# Patient Record
Sex: Female | Born: 1960 | Race: White | Hispanic: No | Marital: Married | Smoking: Never smoker
Health system: Southern US, Community
[De-identification: ages and names within clinical notes are randomized; demographics above are authoritative.]

## PROBLEM LIST (undated history)

## (undated) DIAGNOSIS — I10 Essential (primary) hypertension: Secondary | ICD-10-CM

## (undated) DIAGNOSIS — I341 Nonrheumatic mitral (valve) prolapse: Secondary | ICD-10-CM

## (undated) DIAGNOSIS — M1990 Unspecified osteoarthritis, unspecified site: Secondary | ICD-10-CM

## (undated) DIAGNOSIS — T883XXA Malignant hyperthermia due to anesthesia, initial encounter: Secondary | ICD-10-CM

## (undated) DIAGNOSIS — E785 Hyperlipidemia, unspecified: Secondary | ICD-10-CM

## (undated) DIAGNOSIS — M5124 Other intervertebral disc displacement, thoracic region: Secondary | ICD-10-CM

## (undated) DIAGNOSIS — M797 Fibromyalgia: Secondary | ICD-10-CM

## (undated) DIAGNOSIS — G35D Multiple sclerosis, unspecified: Secondary | ICD-10-CM

## (undated) DIAGNOSIS — J45909 Unspecified asthma, uncomplicated: Secondary | ICD-10-CM

## (undated) DIAGNOSIS — J189 Pneumonia, unspecified organism: Secondary | ICD-10-CM

## (undated) DIAGNOSIS — Z8669 Personal history of other diseases of the nervous system and sense organs: Secondary | ICD-10-CM

## (undated) DIAGNOSIS — K859 Acute pancreatitis without necrosis or infection, unspecified: Secondary | ICD-10-CM

## (undated) DIAGNOSIS — J449 Chronic obstructive pulmonary disease, unspecified: Secondary | ICD-10-CM

## (undated) DIAGNOSIS — M810 Age-related osteoporosis without current pathological fracture: Secondary | ICD-10-CM

## (undated) DIAGNOSIS — G35 Multiple sclerosis: Secondary | ICD-10-CM

## (undated) DIAGNOSIS — K6389 Other specified diseases of intestine: Secondary | ICD-10-CM

## (undated) HISTORY — DX: Essential (primary) hypertension: I10

## (undated) HISTORY — DX: Acute pancreatitis without necrosis or infection, unspecified: K85.90

## (undated) HISTORY — DX: Fibromyalgia: M79.7

## (undated) HISTORY — DX: Personal history of other diseases of the nervous system and sense organs: Z86.69

## (undated) HISTORY — DX: Other specified diseases of intestine: K63.89

## (undated) HISTORY — PX: ABDOMINAL HYSTERECTOMY: SHX81

## (undated) HISTORY — DX: Other intervertebral disc displacement, thoracic region: M51.24

## (undated) HISTORY — DX: Hyperlipidemia, unspecified: E78.5

## (undated) HISTORY — DX: Chronic obstructive pulmonary disease, unspecified: J44.9

## (undated) HISTORY — DX: Unspecified asthma, uncomplicated: J45.909

## (undated) HISTORY — PX: BREAST BIOPSY: SHX20

## (undated) HISTORY — PX: OTHER SURGICAL HISTORY: SHX169

## (undated) HISTORY — PX: TONSILLECTOMY: SUR1361

## (undated) HISTORY — PX: DEBRIDEMENT LEG: SUR390

---

## 2000-05-06 ENCOUNTER — Other Ambulatory Visit: Admission: RE | Admit: 2000-05-06 | Discharge: 2000-05-06 | Payer: Self-pay | Admitting: Obstetrics and Gynecology

## 2001-05-10 ENCOUNTER — Other Ambulatory Visit: Admission: RE | Admit: 2001-05-10 | Discharge: 2001-05-10 | Payer: Self-pay | Admitting: Obstetrics and Gynecology

## 2007-06-30 ENCOUNTER — Emergency Department (HOSPITAL_COMMUNITY): Admission: EM | Admit: 2007-06-30 | Discharge: 2007-06-30 | Payer: Self-pay | Admitting: Emergency Medicine

## 2007-06-30 ENCOUNTER — Ambulatory Visit (HOSPITAL_COMMUNITY): Admission: RE | Admit: 2007-06-30 | Discharge: 2007-06-30 | Payer: Self-pay | Admitting: Family Medicine

## 2009-01-07 ENCOUNTER — Ambulatory Visit (HOSPITAL_COMMUNITY): Admission: RE | Admit: 2009-01-07 | Discharge: 2009-01-07 | Payer: Self-pay | Admitting: Family Medicine

## 2009-01-10 ENCOUNTER — Encounter: Admission: RE | Admit: 2009-01-10 | Discharge: 2009-01-10 | Payer: Self-pay | Admitting: Family Medicine

## 2009-07-12 ENCOUNTER — Emergency Department (HOSPITAL_COMMUNITY): Admission: EM | Admit: 2009-07-12 | Discharge: 2009-07-12 | Payer: Self-pay | Admitting: Emergency Medicine

## 2009-08-15 ENCOUNTER — Encounter: Admission: RE | Admit: 2009-08-15 | Discharge: 2009-08-15 | Payer: Self-pay | Admitting: Orthopedic Surgery

## 2009-09-03 ENCOUNTER — Encounter: Admission: RE | Admit: 2009-09-03 | Discharge: 2009-09-03 | Payer: Self-pay | Admitting: Orthopedic Surgery

## 2010-03-20 ENCOUNTER — Ambulatory Visit (HOSPITAL_COMMUNITY): Admission: RE | Admit: 2010-03-20 | Discharge: 2010-03-20 | Payer: Self-pay | Admitting: Family Medicine

## 2010-08-03 ENCOUNTER — Encounter: Payer: Self-pay | Admitting: Orthopedic Surgery

## 2010-08-04 ENCOUNTER — Encounter: Payer: Self-pay | Admitting: Family Medicine

## 2010-11-26 ENCOUNTER — Emergency Department (HOSPITAL_COMMUNITY)
Admission: EM | Admit: 2010-11-26 | Discharge: 2010-11-26 | Disposition: A | Discharge status: Home / Self Care | Payer: Self-pay | Attending: Emergency Medicine | Admitting: Emergency Medicine

## 2010-11-26 ENCOUNTER — Emergency Department (HOSPITAL_COMMUNITY): Payer: Self-pay

## 2010-11-26 DIAGNOSIS — M79609 Pain in unspecified limb: Secondary | ICD-10-CM | POA: Insufficient documentation

## 2010-11-26 DIAGNOSIS — I10 Essential (primary) hypertension: Secondary | ICD-10-CM | POA: Insufficient documentation

## 2010-11-26 DIAGNOSIS — Y998 Other external cause status: Secondary | ICD-10-CM | POA: Insufficient documentation

## 2010-11-26 DIAGNOSIS — S0990XA Unspecified injury of head, initial encounter: Secondary | ICD-10-CM | POA: Insufficient documentation

## 2010-12-04 ENCOUNTER — Other Ambulatory Visit: Payer: Self-pay | Admitting: Neurosurgery

## 2011-10-12 ENCOUNTER — Emergency Department (HOSPITAL_COMMUNITY)
Admission: EM | Admit: 2011-10-12 | Discharge: 2011-10-12 | Disposition: A | Discharge status: Home / Self Care | Payer: BC Managed Care – PPO | Attending: Emergency Medicine | Admitting: Emergency Medicine

## 2011-10-12 ENCOUNTER — Emergency Department (HOSPITAL_COMMUNITY): Payer: BC Managed Care – PPO

## 2011-10-12 ENCOUNTER — Encounter (HOSPITAL_COMMUNITY): Payer: Self-pay | Admitting: *Deleted

## 2011-10-12 DIAGNOSIS — Y92009 Unspecified place in unspecified non-institutional (private) residence as the place of occurrence of the external cause: Secondary | ICD-10-CM | POA: Insufficient documentation

## 2011-10-12 DIAGNOSIS — T07XXXA Unspecified multiple injuries, initial encounter: Secondary | ICD-10-CM

## 2011-10-12 DIAGNOSIS — W108XXA Fall (on) (from) other stairs and steps, initial encounter: Secondary | ICD-10-CM | POA: Insufficient documentation

## 2011-10-12 DIAGNOSIS — M25519 Pain in unspecified shoulder: Secondary | ICD-10-CM | POA: Insufficient documentation

## 2011-10-12 DIAGNOSIS — I10 Essential (primary) hypertension: Secondary | ICD-10-CM | POA: Insufficient documentation

## 2011-10-12 DIAGNOSIS — S20219A Contusion of unspecified front wall of thorax, initial encounter: Secondary | ICD-10-CM | POA: Insufficient documentation

## 2011-10-12 DIAGNOSIS — R079 Chest pain, unspecified: Secondary | ICD-10-CM | POA: Insufficient documentation

## 2011-10-12 DIAGNOSIS — S40019A Contusion of unspecified shoulder, initial encounter: Secondary | ICD-10-CM | POA: Insufficient documentation

## 2011-10-12 DIAGNOSIS — R109 Unspecified abdominal pain: Secondary | ICD-10-CM | POA: Insufficient documentation

## 2011-10-12 MED ORDER — IBUPROFEN 800 MG PO TABS
800.0000 mg | ORAL_TABLET | Freq: Once | ORAL | Status: AC
  Administered 2011-10-12: 800 mg via ORAL
  Filled 2011-10-12: qty 1

## 2011-10-12 MED ORDER — GUAIFENESIN-CODEINE 100-10 MG/5ML PO SYRP: ORAL_SOLUTION | ORAL | Status: DC

## 2011-10-12 NOTE — Discharge Instructions (Signed)
Contusion A contusion is a deep bruise. Contusions are the result of an injury that caused bleeding under the skin. The contusion may turn blue, purple, or yellow. Minor injuries will give you a painless contusion, but more severe contusions may stay painful and swollen for a few weeks.  CAUSES  A contusion is usually caused by a blow, trauma, or direct force to an area of the body. SYMPTOMS   Swelling and redness of the injured area.   Bruising of the injured area.   Tenderness and soreness of the injured area.   Pain.  DIAGNOSIS  The diagnosis can be made by taking a history and physical exam. An X-ray, CT scan, or MRI may be needed to determine if there were any associated injuries, such as fractures. TREATMENT  Specific treatment will depend on what area of the body was injured. In general, the best treatment for a contusion is resting, icing, elevating, and applying cold compresses to the injured area. Over-the-counter medicines may also be recommended for pain control. Ask your caregiver what the best treatment is for your contusion. HOME CARE INSTRUCTIONS   Put ice on the injured area.   Put ice in a plastic bag.   Place a towel between your skin and the bag.   Leave the ice on for 15 to 20 minutes, 3 to 4 times a day.   Only take over-the-counter or prescription medicines for pain, discomfort, or fever as directed by your caregiver. Your caregiver may recommend avoiding anti-inflammatory medicines (aspirin, ibuprofen, and naproxen) for 48 hours because these medicines may increase bruising.   Rest the injured area.   If possible, elevate the injured area to reduce swelling.  SEEK IMMEDIATE MEDICAL CARE IF:   You have increased bruising or swelling.   You have pain that is getting worse.   Your swelling or pain is not relieved with medicines.  MAKE SURE YOU:   Understand these instructions.   Will watch your condition.   Will get help right away if you are not  doing well or get worse.  Document Released: 04/08/2005 Document Revised: 06/18/2011 Document Reviewed: 05/04/2011 [facility] Patient Information 2012 [place], [place].Cryotherapy Cryotherapy means treatment with cold. Ice or gel packs can be used to reduce both pain and swelling. Ice is the most helpful within the first 24 to 48 hours after an injury or flareup from overusing a muscle or joint. Sprains, strains, spasms, burning pain, shooting pain, and aches can all be eased with ice. Ice can also be used when recovering from surgery. Ice is effective, has very few side effects, and is safe for most people to use. PRECAUTIONS  Ice is not a safe treatment option for people with:  Raynaud's phenomenon. This is a condition affecting small blood vessels in the extremities. Exposure to cold may cause your problems to return.   Cold hypersensitivity. There are many forms of cold hypersensitivity, including:   Cold urticaria. Red, itchy hives appear on the skin when the tissues begin to warm after being iced.   Cold erythema. This is a red, itchy rash caused by exposure to cold.   Cold hemoglobinuria. Red blood cells break down when the tissues begin to warm after being iced. The hemoglobin that carry oxygen are passed into the urine because they cannot combine with blood proteins fast enough.   Numbness or altered sensitivity in the area being iced.  If you have any of the following conditions, do not use ice until you have discussed  cryotherapy with your caregiver:  Heart conditions, such as arrhythmia, angina, or chronic heart disease.   High blood pressure.   Healing wounds or open skin in the area being iced.   Current infections.   Rheumatoid arthritis.   Poor circulation.   Diabetes.  Ice slows the blood flow in the region it is applied. This is beneficial when trying to stop inflamed tissues from spreading irritating chemicals to surrounding tissues. However, if you expose your skin  to cold temperatures for too long or without the proper protection, you can damage your skin or nerves. Watch for signs of skin damage due to cold. HOME CARE INSTRUCTIONS Follow these tips to use ice and cold packs safely.  Place a dry or damp towel between the ice and skin. A damp towel will cool the skin more quickly, so you may need to shorten the time that the ice is used.   For a more rapid response, add gentle compression to the ice.   Ice for no more than 10 to 20 minutes at a time. The bonier the area you are icing, the less time it will take to get the benefits of ice.   Check your skin after 5 minutes to make sure there are no signs of a poor response to cold or skin damage.   Rest 20 minutes or more in between uses.   Once your skin is numb, you can end your treatment. You can test numbness by very lightly touching your skin. The touch should be so light that you do not see the skin dimple from the pressure of your fingertip. When using ice, most people will feel these normal sensations in this order: cold, burning, aching, and numbness.   Do not use ice on someone who cannot communicate their responses to pain, such as small children or people with dementia.  HOW TO MAKE AN ICE PACK Ice packs are the most common way to use ice therapy. Other methods include ice massage, ice baths, and cryo-sprays. Muscle creams that cause a cold, tingly feeling do not offer the same benefits that ice offers and should not be used as a substitute unless recommended by your caregiver. To make an ice pack, do one of the following:  Place crushed ice or a bag of frozen vegetables in a sealable plastic bag. Squeeze out the excess air. Place this bag inside another plastic bag. Slide the bag into a pillowcase or place a damp towel between your skin and the bag.   Mix 3 parts water with 1 part rubbing alcohol. Freeze the mixture in a sealable plastic bag. When you remove the mixture from the freezer, it  will be slushy. Squeeze out the excess air. Place this bag inside another plastic bag. Slide the bag into a pillowcase or place a damp towel between your skin and the bag.  SEEK MEDICAL CARE IF:  You develop white spots on your skin. This may give the skin a blotchy (mottled) appearance.   Your skin turns blue or pale.   Your skin becomes waxy or hard.   Your swelling gets worse.  MAKE SURE YOU:   Understand these instructions.   Will watch your condition.   Will get help right away if you are not doing well or get worse.  Document Released: 02/23/2011 Document Revised: 06/18/2011 Document Reviewed: 02/23/2011 [facility] Patient Information 2012 [place], [place].   Take the cough medicine as directed.  Take ibuprofen up to 800 mg every 8 hrs  with food.  F/u with your MD.  Return to the ED if your symptoms worsen or change in the meantime.

## 2011-10-12 NOTE — ED Provider Notes (Signed)
History     CSN: [id number]  Arrival date & time 10/12/11  4098   First MD Initiated Contact with Patient 10/12/11 1026      Chief Complaint  Patient presents with  . Shoulder Pain  . Rib Injury  . Groin Injury    (Consider location/radiation/quality/duration/timing/severity/associated sxs/prior treatment) HPI Comments: Pt missed an approximate 1 foot high step and fell onto a deck.  No LOC.  Ibuprofen with minimal relief.  Patient is a 51 y.o. female presenting with shoulder pain. The history is provided by the patient. No language interpreter was used.  Shoulder Pain This is a new problem. The current episode started yesterday. The problem occurs constantly. The problem has been unchanged. Exacerbated by: shoulder pain worse with movement.  hip pain worse with weight bearing. She has tried NSAIDs for the symptoms. The treatment provided mild relief.    Past Medical History  Diagnosis Date  . Hypothermia   . Hypertension     Past Surgical History  Procedure Date  . Abdominal hysterectomy   . Tonsillectomy     No family history on file.  History  Substance Use Topics  . Smoking status: Never Smoker   . Smokeless tobacco: Not on file  . Alcohol Use: No    OB History    Grav Para Term Preterm Abortions TAB SAB Ect Mult Living                  Review of Systems  Musculoskeletal:       Shoulder, rib and hip injury  All other systems reviewed and are negative.    Allergies  Benadryl and Iohexol  Home Medications   Current Outpatient Rx  Name Route Sig Dispense Refill  . GUAIFENESIN-CODEINE 100-10 MG/5ML PO SYRP  10 mls po q 4-6 hrs prn cough 240 mL 0    BP 134/93  Pulse 92  Temp(Src) 98.1 F (36.7 C) (Oral)  Resp 20  Ht 5\' 3"  (1.6 m)  Wt 132 lb 3 oz (59.96 kg)  BMI 23.42 kg/m2  SpO2 99%  Physical Exam  Nursing note and vitals reviewed. Constitutional: She is oriented to person, place, and time. She appears well-developed and well-nourished. No  distress.  HENT:  Head: Normocephalic and atraumatic.  Eyes: EOM are normal.  Neck: Normal range of motion.  Cardiovascular: Normal rate, regular rhythm and normal heart sounds.   Pulmonary/Chest: Effort normal and breath sounds normal. No accessory muscle usage. Not tachypneic. No respiratory distress. She has no decreased breath sounds. She has no wheezes. She has no rhonchi. She has no rales. She exhibits bony tenderness. She exhibits no crepitus.    Abdominal: Soft. She exhibits no distension. There is no tenderness.  Musculoskeletal:       Arms:      Legs: Neurological: She is alert and oriented to person, place, and time.  Skin: Skin is warm and dry.  Psychiatric: She has a normal mood and affect. Judgment normal.    ED Course  Procedures (including critical care time)  Labs Reviewed - No data to display Dg Ribs Unilateral W/chest Left  10/12/2011  *RADIOLOGY REPORT*  Clinical Data: Rib injury.  Shoulder pain.  LEFT RIBS AND CHEST - 3+ VIEW  Comparison: 08/15/2009 CT scan  Findings: No pneumothorax or pleural effusion is observed.  A well- defined rib fracture is not observed.  The lungs appear clear. Cardiac and mediastinal contours appear unremarkable.  IMPRESSION:  1.  No pneumothorax or pleural effusion.  No discrete well-defined rib fracture is observed.  Original Report Authenticated By: Dellia Cloud, M.D.   Dg Hip Complete Left  10/12/2011  *RADIOLOGY REPORT*  Clinical Data: Left shoulder pain, rib injury, groin injury.  LEFT HIP - COMPLETE 2+ VIEW  Comparison: None  Findings: No acute bony abnormality.  Specifically, no fracture, subluxation, or dislocation.  Soft tissues are intact.  Joint spaces are maintained.  SI joints are symmetric and unremarkable.  IMPRESSION: No acute bony abnormality.  Original Report Authenticated By: Cyndie Chime, M.D.   Dg Shoulder Left  10/12/2011  *RADIOLOGY REPORT*  Clinical Data: Left shoulder pain.  Fall.  LEFT SHOULDER - 2+ VIEW   Comparison: None.  Findings: No fracture or dislocation is identified.  Thoracic spondylosis noted.  The acromial undersurface is type 2 (curved).  IMPRESSION:  1.  No acute bony findings are observed.  Original Report Authenticated By: Dellia Cloud, M.D.     1. Multiple contusions       MDM  rx-robitussin AC OTC ibuprofen Ice F/u with dr. Janna Arch prn        Worthy Rancher, PA 10/12/11 1224  Worthy Rancher, PA 10/12/11 1228

## 2011-10-12 NOTE — ED Provider Notes (Signed)
History/physical exam/procedure(s) were performed by non-physician practitioner and as supervising physician I was immediately available for consultation/collaboration. I have reviewed all notes and am in agreement with care and plan.   Hilario Quarry, MD 10/12/11 [phone]

## 2011-10-12 NOTE — ED Notes (Signed)
Pt states that she was coming out her steps yesterday and ?tripped over top step, resulting a fall landing on her left side, pt states that she fell on decking, c/o pain to left groin, left shoulder, left rib cage area, left mid back area. Pt ambulatory to triage, positive radial pulse in left hand, pain can be reproduced with movement of left shoulder, lung sounds present bilateral .

## 2013-02-13 ENCOUNTER — Emergency Department (HOSPITAL_COMMUNITY)
Admission: EM | Admit: 2013-02-13 | Discharge: 2013-02-13 | Disposition: A | Discharge status: Home / Self Care | Payer: BC Managed Care – PPO | Attending: Emergency Medicine | Admitting: Emergency Medicine

## 2013-02-13 ENCOUNTER — Emergency Department (HOSPITAL_COMMUNITY): Payer: BC Managed Care – PPO

## 2013-02-13 ENCOUNTER — Encounter (HOSPITAL_COMMUNITY): Payer: Self-pay

## 2013-02-13 DIAGNOSIS — Z23 Encounter for immunization: Secondary | ICD-10-CM | POA: Insufficient documentation

## 2013-02-13 DIAGNOSIS — IMO0002 Reserved for concepts with insufficient information to code with codable children: Secondary | ICD-10-CM | POA: Insufficient documentation

## 2013-02-13 DIAGNOSIS — Y929 Unspecified place or not applicable: Secondary | ICD-10-CM | POA: Insufficient documentation

## 2013-02-13 DIAGNOSIS — Z8739 Personal history of other diseases of the musculoskeletal system and connective tissue: Secondary | ICD-10-CM | POA: Insufficient documentation

## 2013-02-13 DIAGNOSIS — Z862 Personal history of diseases of the blood and blood-forming organs and certain disorders involving the immune mechanism: Secondary | ICD-10-CM | POA: Insufficient documentation

## 2013-02-13 DIAGNOSIS — Z880 Allergy status to penicillin: Secondary | ICD-10-CM | POA: Insufficient documentation

## 2013-02-13 DIAGNOSIS — S91201A Unspecified open wound of right great toe with damage to nail, initial encounter: Secondary | ICD-10-CM

## 2013-02-13 DIAGNOSIS — S91109A Unspecified open wound of unspecified toe(s) without damage to nail, initial encounter: Secondary | ICD-10-CM | POA: Insufficient documentation

## 2013-02-13 DIAGNOSIS — Y9389 Activity, other specified: Secondary | ICD-10-CM | POA: Insufficient documentation

## 2013-02-13 DIAGNOSIS — Z8639 Personal history of other endocrine, nutritional and metabolic disease: Secondary | ICD-10-CM | POA: Insufficient documentation

## 2013-02-13 DIAGNOSIS — I10 Essential (primary) hypertension: Secondary | ICD-10-CM | POA: Insufficient documentation

## 2013-02-13 HISTORY — DX: Unspecified osteoarthritis, unspecified site: M19.90

## 2013-02-13 MED ORDER — CEPHALEXIN 500 MG PO CAPS: 500.0000 mg | ORAL_CAPSULE | Freq: Three times a day (TID) | ORAL | Status: DC

## 2013-02-13 MED ORDER — TETANUS-DIPHTH-ACELL PERTUSSIS 5-2.5-18.5 LF-MCG/0.5 IM SUSP
0.5000 mL | Freq: Once | INTRAMUSCULAR | Status: AC
  Administered 2013-02-13: 0.5 mL via INTRAMUSCULAR
  Filled 2013-02-13: qty 0.5

## 2013-02-13 MED ORDER — HYDROCODONE-ACETAMINOPHEN 5-325 MG PO TABS: 1.0000 | ORAL_TABLET | ORAL | Status: DC | PRN

## 2013-02-13 MED ORDER — OXYCODONE-ACETAMINOPHEN 5-325 MG PO TABS
1.0000 | ORAL_TABLET | Freq: Once | ORAL | Status: AC
  Administered 2013-02-13: 1 via ORAL
  Filled 2013-02-13: qty 1

## 2013-02-13 NOTE — ED Notes (Signed)
Pt had rt foot caught in plastic covering water bottles,  Rt great toenail is avulsed.Went to [place] but was not seen

## 2013-02-13 NOTE — ED Notes (Signed)
Rt foot placed in basin with saline and betadine to cleanse

## 2013-02-13 NOTE — ED Provider Notes (Signed)
Medical screening examination/treatment/procedure(s) were performed by non-physician practitioner and as supervising physician I was immediately available for consultation/collaboration.  Doug Sou, MD 02/13/13 2358

## 2013-02-13 NOTE — ED Provider Notes (Signed)
CSN: [id number]     Arrival date & time 02/13/13  1851 History     First MD Initiated Contact with Patient 02/13/13 1918     Chief Complaint  Patient presents with  . Toe Injury   (Consider location/radiation/quality/duration/timing/severity/associated sxs/prior Treatment) The history is provided by the patient.   Vanessa Mcconnell is a 52 y.o. female who presents to the ED with pain in the right great toe. She states that last night she was moving a heavy case of water and the edge of the box caught her big toe and pulled her toenail off. It scraped over the other toes and she has swelling of all the toes on the right. She bandaged her foot and took ibuprofen. She went to work today but the nail bed has continued to bleed and the pain has increased so she came in for evaluation. Her tetanus is not up to date.   Past Medical History  Diagnosis Date  . Hypothermia   . Hypertension   . Arthritis    Past Surgical History  Procedure Laterality Date  . Abdominal hysterectomy    . Tonsillectomy     History reviewed. No pertinent family history. History  Substance Use Topics  . Smoking status: Never Smoker   . Smokeless tobacco: Not on file  . Alcohol Use: No   OB History   Grav Para Term Preterm Abortions TAB SAB Ect Mult Living                 Review of Systems  Constitutional: Negative for fever and chills.  HENT: Negative for neck pain.   Respiratory: Negative for shortness of breath.   Gastrointestinal: Negative for nausea and vomiting.  Musculoskeletal: Gait problem: pain with ambulation.  Skin: Positive for wound.  Neurological: Negative for headaches.  Psychiatric/Behavioral: The patient is not nervous/anxious.     Allergies  Benadryl; Iohexol; and Penicillins  Home Medications   Current Outpatient Rx  Name  Route  Sig  Dispense  Refill  . guaiFENesin-codeine (ROBITUSSIN AC) 100-10 MG/5ML syrup      10 mls po q 4-6 hrs prn cough   240 mL   0    BP  157/100  Pulse 68  Temp(Src) 98.2 F (36.8 C) (Oral)  Resp 20  Ht 5\' 3"  (1.6 m)  Wt 142 lb 6.4 oz (64.592 kg)  BMI 25.23 kg/m2  SpO2 99% Physical Exam  Nursing note and vitals reviewed. Constitutional: She is oriented to person, place, and time. She appears well-developed and well-nourished. No distress.  HENT:  Head: Normocephalic.  Eyes: EOM are normal.  Neck: Neck supple.  Cardiovascular: Normal rate.   Pulmonary/Chest: Effort normal.  Musculoskeletal:       Right foot: She exhibits tenderness and swelling. She exhibits normal range of motion and no deformity.       Feet:  Nail completely of of right great toe. No bleeding at this time.   Neurological: She is alert and oriented to person, place, and time. No cranial nerve deficit.  Skin: Skin is warm and dry.  Psychiatric: She has a normal mood and affect. Her behavior is normal.    ED Course  Dg Foot Complete Right  02/13/2013   *RADIOLOGY REPORT*  Clinical Data: Post-traumatic great toe pain.  A portion of the great toe nail is missing.  RIGHT FOOT COMPLETE - 3+ VIEW  Comparison: None.  Findings: There is irregularity of the great toe nail bed.  No foreign body  or definite soft tissue emphysema is seen. The mineralization and alignment are normal.  There is no evidence of acute fracture or dislocation.  IMPRESSION: No acute osseous findings or radiopaque foreign body.   Original Report Authenticated By: Carey Bullocks, M.D.    Procedures MDM  52 y.o. female with right great toenail loss due to injury. Soaked in NSS and betadine. Xeroform gauze dressing. Follow up with ortho.  Discussed with the patient plan of care and x-ray findings and all questioned fully answered. She will return if any problems arise.     Medication List    TAKE these medications       cephALEXin 500 MG capsule  Commonly known as:  KEFLEX  Take 1 capsule (500 mg total) by mouth 3 (three) times daily.     HYDROcodone-acetaminophen 5-325 MG per  tablet  Commonly known as:  NORCO/VICODIN  Take 1 tablet by mouth every 4 (four) hours as needed.      ASK your doctor about these medications       guaiFENesin-codeine 100-10 MG/5ML syrup  Commonly known as:  ROBITUSSIN AC  10 mls po q 4-6 hrs prn cough         Janne Napoleon, NP 02/13/13 2046

## 2013-02-13 NOTE — ED Notes (Signed)
Picking up a case of water, hit right big toe, avulsed nail and bent toe

## 2013-02-16 ENCOUNTER — Emergency Department (HOSPITAL_COMMUNITY)
Admission: EM | Admit: 2013-02-16 | Discharge: 2013-02-16 | Disposition: A | Discharge status: Home / Self Care | Payer: BC Managed Care – PPO | Attending: Emergency Medicine | Admitting: Emergency Medicine

## 2013-02-16 ENCOUNTER — Encounter (HOSPITAL_COMMUNITY): Payer: Self-pay | Admitting: Emergency Medicine

## 2013-02-16 ENCOUNTER — Emergency Department (HOSPITAL_COMMUNITY): Payer: BC Managed Care – PPO

## 2013-02-16 DIAGNOSIS — I10 Essential (primary) hypertension: Secondary | ICD-10-CM | POA: Insufficient documentation

## 2013-02-16 DIAGNOSIS — R059 Cough, unspecified: Secondary | ICD-10-CM | POA: Insufficient documentation

## 2013-02-16 DIAGNOSIS — R05 Cough: Secondary | ICD-10-CM | POA: Insufficient documentation

## 2013-02-16 DIAGNOSIS — R079 Chest pain, unspecified: Secondary | ICD-10-CM

## 2013-02-16 DIAGNOSIS — Z8739 Personal history of other diseases of the musculoskeletal system and connective tissue: Secondary | ICD-10-CM | POA: Insufficient documentation

## 2013-02-16 DIAGNOSIS — M79609 Pain in unspecified limb: Secondary | ICD-10-CM | POA: Insufficient documentation

## 2013-02-16 DIAGNOSIS — Z880 Allergy status to penicillin: Secondary | ICD-10-CM | POA: Insufficient documentation

## 2013-02-16 DIAGNOSIS — R002 Palpitations: Secondary | ICD-10-CM

## 2013-02-16 DIAGNOSIS — R0789 Other chest pain: Secondary | ICD-10-CM | POA: Insufficient documentation

## 2013-02-16 LAB — BASIC METABOLIC PANEL
CO2: 27 mEq/L (ref 19–32)
Calcium: 9.6 mg/dL (ref 8.4–10.5)
Creatinine, Ser: 0.6 mg/dL (ref 0.50–1.10)
GFR calc Af Amer: >90 mL/min (ref >90)
GFR calc non Af Amer: >90 mL/min (ref >90)
Sodium: 140 mEq/L (ref 135–145)

## 2013-02-16 LAB — CBC WITH DIFFERENTIAL/PLATELET
Basophils Absolute: 0 10*3/uL (ref 0.0–0.1)
Basophils Relative: 0 % (ref 0–1)
Eosinophils Relative: 2 % (ref 0–5)
Lymphocytes Relative: 31 % (ref 12–46)
MCHC: 34.5 g/dL (ref 30.0–36.0)
MCV: 88.9 fL (ref 78.0–100.0)
Platelets: 294 10*3/uL (ref 150–400)
RDW: 12.8 % (ref 11.5–15.5)
WBC: 7.6 10*3/uL (ref 4.0–10.5)

## 2013-02-16 LAB — TROPONIN I: Troponin I: <0.3 ng/mL (ref <0.30)

## 2013-02-16 NOTE — ED Notes (Signed)
Pt reports having an episode of feeling her heart "flip in my chest" that woke her up from her sleep Tuesday evening. Reports another episode of the same today along with burning pain from previously injured great toe on the right foot.

## 2013-02-16 NOTE — ED Provider Notes (Signed)
CSN: [id number]     Arrival date & time 02/16/13  1953 History  This chart was scribed for [employer]. Rubin Payor, MD by Greggory Stallion, ED Scribe. This patient was seen in room APA07/APA07 and the patient's care was started at 9:03 PM.   Chief Complaint  Patient presents with  . Palpitations  . Toe Pain   The history is provided by the patient. No language interpreter was used.    HPI Comments: Vanessa Mcconnell is a 52 y.o. female who presents to the Emergency Department complaining of sudden onset, constant sharp toe pain that started on Tuesday when she dropped something on it. She states she was here for the toe pain on Tuesday but is now having palpitations that started last night. She states she had one large thump then her heart started racing. It's worsened when she breathes. Pt states she has had a few thumps like that through the day but the one she had this afternoon caused her to have burning foot pain. Pt now has numbness in her foot. She denies fever as an associated symptom.   Past Medical History  Diagnosis Date  . Hypothermia   . Hypertension   . Arthritis    Past Surgical History  Procedure Laterality Date  . Abdominal hysterectomy    . Tonsillectomy     History reviewed. No pertinent family history. History  Substance Use Topics  . Smoking status: Never Smoker   . Smokeless tobacco: Not on file  . Alcohol Use: No   OB History   Grav Para Term Preterm Abortions TAB SAB Ect Mult Living                 Review of Systems  Constitutional: Negative for fever (subjective).  Respiratory: Positive for cough.   Cardiovascular: Positive for palpitations.  Musculoskeletal: Positive for arthralgias.  Neurological: Positive for numbness.  All other systems reviewed and are negative.    Allergies  Benadryl; Iohexol; and Penicillins  Home Medications   Current Outpatient Rx  Name  Route  Sig  Dispense  Refill  . cephALEXin (KEFLEX) 500 MG capsule   Oral  Take 1 capsule (500 mg total) by mouth 3 (three) times daily.   21 capsule   0   . HYDROcodone-acetaminophen (NORCO/VICODIN) 5-325 MG per tablet   Oral   Take 1 tablet by mouth every 4 (four) hours as needed.   15 tablet   0    BP 174/98  Pulse 86  Temp(Src) 97.7 F (36.5 C) (Oral)  Resp 20  Ht 5\' 3"  (1.6 m)  Wt 142 lb (64.411 kg)  BMI 25.16 kg/m2  SpO2 96%  Physical Exam  Nursing note and vitals reviewed. Constitutional: She is oriented to person, place, and time. She appears well-developed and well-nourished. No distress.  HENT:  Head: Normocephalic and atraumatic.  Eyes: EOM are normal.  Neck: Normal range of motion.  Cardiovascular: Normal rate, regular rhythm and normal heart sounds.   Good capillary refill in right great toe.   Pulmonary/Chest: Effort normal and breath sounds normal.  Mild anterior chest tenderness.   Abdominal: Soft. There is no tenderness.  Musculoskeletal: Normal range of motion. She exhibits no edema and no tenderness.  Previous right great toe injury. No nail. Mild decreased sensation on dorsum of right foot. No swelling.   Neurological: She is alert and oriented to person, place, and time.  Skin: Skin is warm and dry.  Psychiatric: She has a normal mood and  affect. Her behavior is normal.    ED Course   Procedures (including critical care time)  COORDINATION OF CARE: 9:11 PM-Discussed treatment plan which includes doing a D-dimer with pt at bedside and pt agreed to plan.   Results for orders placed during the hospital encounter of 02/16/13  D-DIMER, QUANTITATIVE      Result Value Range   D-Dimer, Quant <0.27  0.00 - 0.48 ug/mL-FEU  TROPONIN I      Result Value Range   Troponin I <0.30  <0.30 ng/mL  CBC WITH DIFFERENTIAL      Result Value Range   WBC 7.6  4.0 - 10.5 K/uL   RBC 5.05  3.87 - 5.11 MIL/uL   Hemoglobin 15.5 (*) 12.0 - 15.0 g/dL   HCT 16.1  09.6 - 04.5 %   MCV 88.9  78.0 - 100.0 fL   MCH 30.7  26.0 - 34.0 pg   MCHC  34.5  30.0 - 36.0 g/dL   RDW 40.9  81.1 - 91.4 %   Platelets 294  150 - 400 K/uL   Neutrophils Relative % 60  43 - 77 %   Neutro Abs 4.5  1.7 - 7.7 K/uL   Lymphocytes Relative 31  12 - 46 %   Lymphs Abs 2.3  0.7 - 4.0 K/uL   Monocytes Relative 7  3 - 12 %   Monocytes Absolute 0.6  0.1 - 1.0 K/uL   Eosinophils Relative 2  0 - 5 %   Eosinophils Absolute 0.2  0.0 - 0.7 K/uL   Basophils Relative 0  0 - 1 %   Basophils Absolute 0.0  0.0 - 0.1 K/uL  BASIC METABOLIC PANEL      Result Value Range   Sodium 140  135 - 145 mEq/L   Potassium 3.4 (*) 3.5 - 5.1 mEq/L   Chloride 103  96 - 112 mEq/L   CO2 27  19 - 32 mEq/L   Glucose, Bld 85  70 - 99 mg/dL   BUN 8  6 - 23 mg/dL   Creatinine, Ser 7.82  0.50 - 1.10 mg/dL   Calcium 9.6  8.4 - 95.6 mg/dL   GFR calc non Af Amer >90  >90 mL/min   GFR calc Af Amer >90  >90 mL/min   Dg Foot Complete Right  02/13/2013   *RADIOLOGY REPORT*  Clinical Data: Post-traumatic great toe pain.  A portion of the great toe nail is missing.  RIGHT FOOT COMPLETE - 3+ VIEW  Comparison: None.  Findings: There is irregularity of the great toe nail bed.  No foreign body or definite soft tissue emphysema is seen. The mineralization and alignment are normal.  There is no evidence of acute fracture or dislocation.  IMPRESSION: No acute osseous findings or radiopaque foreign body.   Original Report Authenticated By: Carey Bullocks, M.D.    Labs Reviewed  CBC WITH DIFFERENTIAL - Abnormal; Notable for the following:    Hemoglobin 15.5 (*)    All other components within normal limits  BASIC METABOLIC PANEL - Abnormal; Notable for the following:    Potassium 3.4 (*)    All other components within normal limits  D-DIMER, QUANTITATIVE  TROPONIN I   Dg Chest 2 View  02/16/2013   *RADIOLOGY REPORT*  Clinical Data: Left side chest pain.  CHEST - 2 VIEW  Comparison: Single view of the chest 10/12/2011, PA and lateral chest 12/23/2010 and CT chest 08/15/2009.  Findings: The lungs  appear clear.  Heart  size is normal.  No pneumothorax or pleural fluid.  No focal bony abnormality.  IMPRESSION: No acute disease.   Original Report Authenticated By: Holley Dexter, M.D.   1. Palpitations   2. Chest pain     MDM  Patient with palpitations and a feeling that her heart "flipped". EKG reassuring. Lab work reassuring with negative d-dimer. Had recent toe injury. Toe appears to be healing well. Will be discharged home. Will follow with her PCP.      I personally performed the services described in this documentation, which was scribed in my presence. The recorded information has been reviewed and is accurate.     Juliet Rude. Rubin Payor, MD 02/16/13 2312

## 2013-02-16 NOTE — ED Notes (Addendum)
Patient states she was treated here for toe injury on Tuesday, then started having palpitations last night. States "My heart is beating so hard it makes my body jerk." Also states "early today, I had an explosion feeling in my injured toe and now my toe is numb."

## 2013-02-25 ENCOUNTER — Other Ambulatory Visit: Payer: Self-pay

## 2013-02-25 ENCOUNTER — Emergency Department (HOSPITAL_COMMUNITY)
Admission: EM | Admit: 2013-02-25 | Discharge: 2013-02-25 | Disposition: A | Discharge status: Home / Self Care | Payer: BC Managed Care – PPO | Attending: Emergency Medicine | Admitting: Emergency Medicine

## 2013-02-25 ENCOUNTER — Encounter (HOSPITAL_COMMUNITY): Payer: Self-pay | Admitting: *Deleted

## 2013-02-25 ENCOUNTER — Emergency Department (HOSPITAL_COMMUNITY): Payer: BC Managed Care – PPO

## 2013-02-25 DIAGNOSIS — Z8739 Personal history of other diseases of the musculoskeletal system and connective tissue: Secondary | ICD-10-CM | POA: Insufficient documentation

## 2013-02-25 DIAGNOSIS — Z79899 Other long term (current) drug therapy: Secondary | ICD-10-CM | POA: Insufficient documentation

## 2013-02-25 DIAGNOSIS — Y939 Activity, unspecified: Secondary | ICD-10-CM | POA: Insufficient documentation

## 2013-02-25 DIAGNOSIS — T7840XA Allergy, unspecified, initial encounter: Secondary | ICD-10-CM

## 2013-02-25 DIAGNOSIS — R0789 Other chest pain: Secondary | ICD-10-CM | POA: Insufficient documentation

## 2013-02-25 DIAGNOSIS — Z87828 Personal history of other (healed) physical injury and trauma: Secondary | ICD-10-CM | POA: Insufficient documentation

## 2013-02-25 DIAGNOSIS — T63461A Toxic effect of venom of wasps, accidental (unintentional), initial encounter: Secondary | ICD-10-CM | POA: Insufficient documentation

## 2013-02-25 DIAGNOSIS — T6391XA Toxic effect of contact with unspecified venomous animal, accidental (unintentional), initial encounter: Secondary | ICD-10-CM | POA: Insufficient documentation

## 2013-02-25 DIAGNOSIS — R21 Rash and other nonspecific skin eruption: Secondary | ICD-10-CM | POA: Insufficient documentation

## 2013-02-25 DIAGNOSIS — Y929 Unspecified place or not applicable: Secondary | ICD-10-CM | POA: Insufficient documentation

## 2013-02-25 DIAGNOSIS — R231 Pallor: Secondary | ICD-10-CM | POA: Insufficient documentation

## 2013-02-25 DIAGNOSIS — I10 Essential (primary) hypertension: Secondary | ICD-10-CM | POA: Insufficient documentation

## 2013-02-25 DIAGNOSIS — Z880 Allergy status to penicillin: Secondary | ICD-10-CM | POA: Insufficient documentation

## 2013-02-25 LAB — CBC WITH DIFFERENTIAL/PLATELET
Eosinophils Absolute: 0.1 10*3/uL (ref 0.0–0.7)
Hemoglobin: 14.1 g/dL (ref 12.0–15.0)
Lymphocytes Relative: 30 % (ref 12–46)
Lymphs Abs: 2.4 10*3/uL (ref 0.7–4.0)
Monocytes Relative: 7 % (ref 3–12)
Neutro Abs: 4.7 10*3/uL (ref 1.7–7.7)
Neutrophils Relative %: 60 % (ref 43–77)
Platelets: 284 10*3/uL (ref 150–400)
RBC: 4.66 MIL/uL (ref 3.87–5.11)
WBC: 7.8 10*3/uL (ref 4.0–10.5)

## 2013-02-25 LAB — BASIC METABOLIC PANEL
Calcium: 9.4 mg/dL (ref 8.4–10.5)
GFR calc Af Amer: >90 mL/min (ref >90)
GFR calc non Af Amer: >90 mL/min (ref >90)
Glucose, Bld: 94 mg/dL (ref 70–99)
Potassium: 3.5 mEq/L (ref 3.5–5.1)
Sodium: 137 mEq/L (ref 135–145)

## 2013-02-25 LAB — TROPONIN I: Troponin I: <0.3 ng/mL (ref <0.30)

## 2013-02-25 MED ORDER — SODIUM CHLORIDE 0.9 % IV BOLUS (SEPSIS)
1000.0000 mL | Freq: Once | INTRAVENOUS | Status: AC
  Administered 2013-02-25: 1000 mL via INTRAVENOUS

## 2013-02-25 MED ORDER — METHYLPREDNISOLONE SODIUM SUCC 125 MG IJ SOLR
125.0000 mg | Freq: Once | INTRAMUSCULAR | Status: AC
  Administered 2013-02-25: 125 mg via INTRAVENOUS
  Filled 2013-02-25: qty 2

## 2013-02-25 MED ORDER — FAMOTIDINE IN NACL 20-0.9 MG/50ML-% IV SOLN
20.0000 mg | Freq: Once | INTRAVENOUS | Status: AC
  Administered 2013-02-25: 20 mg via INTRAVENOUS
  Filled 2013-02-25: qty 50

## 2013-02-25 NOTE — ED Notes (Signed)
MD at bedside. 

## 2013-02-25 NOTE — ED Provider Notes (Signed)
Scribed for Donnetta Hutching, MD, the patient was seen in room APA16A/APA16A. This chart was scribed by Lewanda Rife, ED scribe. Patient's care was started at 2047  CSN: [id number]     Arrival date & time 02/25/13  2004 History     First MD Initiated Contact with Patient 02/25/13 2030     Chief Complaint  Patient presents with  . Chest Pain  . bee stings    (Consider location/radiation/quality/duration/timing/severity/associated sxs/prior Treatment) The history is provided by the patient.   HPI Comments: Vanessa Mcconnell is a 52 y.o. female who presents to the Emergency Department complaining of multiple bee stings from the waist down onset PTA. Reports associated moderate pain to bee sting sites, chest pain, chest tightness and swelling. Denies associated shortness of breath. Denies any aggravating or alleviating factors. Denies taking any medications PTA to alleviate symptoms. Reports allergy to bee venom, and benadryl. Reports hx mitral valve prolapse and asthma. Denies other significant cardiac hx.   Past Medical History  Diagnosis Date  . Hypothermia   . Hypertension   . Arthritis    Past Surgical History  Procedure Laterality Date  . Abdominal hysterectomy    . Tonsillectomy     History reviewed. No pertinent family history. History  Substance Use Topics  . Smoking status: Never Smoker   . Smokeless tobacco: Not on file  . Alcohol Use: No   OB History   Grav Para Term Preterm Abortions TAB SAB Ect Mult Living                 Review of Systems  Respiratory: Negative for shortness of breath.   Cardiovascular: Positive for chest pain.  Skin: Positive for rash.   A complete 10 system review of systems was obtained and all systems are negative except as noted in the HPI and PMH.    Allergies  Benadryl; Iohexol; and Penicillins  Home Medications   Current Outpatient Rx  Name  Route  Sig  Dispense  Refill  . cephALEXin (KEFLEX) 500 MG capsule   Oral   Take 1  capsule (500 mg total) by mouth 3 (three) times daily.   21 capsule   0   . HYDROcodone-acetaminophen (NORCO/VICODIN) 5-325 MG per tablet   Oral   Take 1 tablet by mouth every 4 (four) hours as needed.   15 tablet   0    BP 146/96  Pulse 80  Temp(Src) 98 F (36.7 C)  Resp 20  Ht 5\' 3"  (1.6 m)  Wt 140 lb (63.504 kg)  BMI 24.81 kg/m2  SpO2 100% Physical Exam  Nursing note and vitals reviewed. Constitutional: She is oriented to person, place, and time. She appears well-developed and well-nourished. No distress.  HENT:  Head: Normocephalic and atraumatic.  Mouth/Throat: Uvula is midline and mucous membranes are normal.  Airway patent   Eyes: Conjunctivae and EOM are normal. Pupils are equal, round, and reactive to light.  Neck: Normal range of motion. Neck supple. No tracheal deviation present.  Cardiovascular: Normal rate, regular rhythm and normal heart sounds.   Pulmonary/Chest: Effort normal and breath sounds normal. No respiratory distress.  Abdominal: Soft. Bowel sounds are normal.  Musculoskeletal: Normal range of motion.  Neurological: She is alert and oriented to person, place, and time.  Skin: Skin is warm and dry. No rash noted. Rash is not urticarial. There is pallor.  Psychiatric: She has a normal mood and affect. Her behavior is normal.    ED Course  Procedures (including critical care time) Medications - No data to display  Labs Reviewed  BASIC METABOLIC PANEL  CBC WITH DIFFERENTIAL  TROPONIN I  Dg Chest Portable 1 View  02/25/2013   *RADIOLOGY REPORT*  Clinical Data: Chest pain  PORTABLE CHEST - 1 VIEW  Comparison: February 16, 2013  Findings: Lungs are mildly hyperexpanded but clear.  Heart size and pulmonary vascularity are normal.  No adenopathy.  No bone lesions.  IMPRESSION: No edema or consolidation.   Original Report Authenticated By: Bretta Bang, M.D.   No results found. No diagnosis found.    Date: 02/25/2013  Rate:75  Rhythm: normal  sinus rhythm  QRS Axis: normal  Intervals: normal  ST/T Wave abnormalities: normal  Conduction Disutrbances: none  Narrative Interpretation: unremarkable    MDM  Patient feeling much better after IV Solu-Medrol, IV Pepcid.   Doubt cardiac etiology.  History more suggestive of allergic reaction to bee stings   I personally performed the services described in this documentation, which was scribed in my presence. The recorded information has been reviewed and is accurate.    Donnetta Hutching, MD 02/25/13 [phone]

## 2013-02-25 NOTE — ED Notes (Addendum)
Pt stepped into a yellow jackets nest x 45 mins ago. Pt is now having chest pain, chest tightness, cough, cold chills, headache, dizziness, and her legs hurt(stung multiple times from the waist down.)

## 2013-02-25 NOTE — ED Notes (Signed)
Pt ambulated to restroom at this time without any assistance. Pt states that she feels fine at this time doesn't need anything. Vanessa Mcconnell

## 2013-11-02 ENCOUNTER — Other Ambulatory Visit (HOSPITAL_COMMUNITY): Payer: Self-pay | Admitting: Family Medicine

## 2013-11-02 ENCOUNTER — Ambulatory Visit (HOSPITAL_COMMUNITY)
Admission: RE | Admit: 2013-11-02 | Discharge: 2013-11-02 | Disposition: A | Discharge status: Home / Self Care | Payer: BC Managed Care – PPO | Source: Ambulatory Visit | Attending: Family Medicine | Admitting: Family Medicine

## 2013-11-02 DIAGNOSIS — R0781 Pleurodynia: Secondary | ICD-10-CM

## 2013-11-02 DIAGNOSIS — R079 Chest pain, unspecified: Secondary | ICD-10-CM | POA: Insufficient documentation

## 2015-01-11 ENCOUNTER — Encounter: Payer: Self-pay | Admitting: Gastroenterology

## 2015-02-05 ENCOUNTER — Ambulatory Visit (INDEPENDENT_AMBULATORY_CARE_PROVIDER_SITE_OTHER): Payer: 59 | Admitting: Nurse Practitioner

## 2015-02-05 ENCOUNTER — Other Ambulatory Visit: Payer: Self-pay

## 2015-02-05 ENCOUNTER — Encounter: Payer: Self-pay | Admitting: Nurse Practitioner

## 2015-02-05 VITALS — BP 174/113 | HR 83 | Temp 98.1°F | Ht 63.0 in | Wt 170.0 lb

## 2015-02-05 DIAGNOSIS — K921 Melena: Secondary | ICD-10-CM | POA: Diagnosis not present

## 2015-02-05 DIAGNOSIS — R112 Nausea with vomiting, unspecified: Secondary | ICD-10-CM | POA: Diagnosis not present

## 2015-02-05 DIAGNOSIS — R140 Abdominal distension (gaseous): Secondary | ICD-10-CM

## 2015-02-05 LAB — CBC WITH DIFFERENTIAL/PLATELET
BASOS PCT: 0 % (ref 0–1)
Basophils Absolute: 0 10*3/uL (ref 0.0–0.1)
EOS ABS: 0.1 10*3/uL (ref 0.0–0.7)
EOS PCT: 2 % (ref 0–5)
HCT: 43.3 % (ref 36.0–46.0)
Hemoglobin: 15.2 g/dL — ABNORMAL HIGH (ref 12.0–15.0)
LYMPHS ABS: 1.5 10*3/uL (ref 0.7–4.0)
Lymphocytes Relative: 23 % (ref 12–46)
MCH: 30.3 pg (ref 26.0–34.0)
MCHC: 35.1 g/dL (ref 30.0–36.0)
MCV: 86.3 fL (ref 78.0–100.0)
MONOS PCT: 8 % (ref 3–12)
MPV: 11 fL (ref 8.6–12.4)
Monocytes Absolute: 0.5 10*3/uL (ref 0.1–1.0)
NEUTROS ABS: 4.3 10*3/uL (ref 1.7–7.7)
Neutrophils Relative %: 67 % (ref 43–77)
PLATELETS: 269 10*3/uL (ref 150–400)
RBC: 5.02 MIL/uL (ref 3.87–5.11)
RDW: 13.6 % (ref 11.5–15.5)
WBC: 6.4 10*3/uL (ref 4.0–10.5)

## 2015-02-05 LAB — COMPREHENSIVE METABOLIC PANEL
ALBUMIN: 4.3 g/dL (ref 3.6–5.1)
ALK PHOS: 117 U/L (ref 33–130)
ALT: 15 U/L (ref 6–29)
AST: 16 U/L (ref 10–35)
BILIRUBIN TOTAL: 0.6 mg/dL (ref 0.2–1.2)
BUN: 10 mg/dL (ref 7–25)
CHLORIDE: 105 meq/L (ref 98–110)
CO2: 26 meq/L (ref 20–31)
CREATININE: 0.64 mg/dL (ref 0.50–1.05)
Calcium: 9.4 mg/dL (ref 8.6–10.4)
Glucose, Bld: 89 mg/dL (ref 65–99)
Potassium: 3.8 mEq/L (ref 3.5–5.3)
Sodium: 139 mEq/L (ref 135–146)
TOTAL PROTEIN: 6.7 g/dL (ref 6.1–8.1)

## 2015-02-05 MED ORDER — PEG 3350-KCL-NA BICARB-NACL 420 G PO SOLR: 4000.0000 mL | Freq: Once | ORAL | Status: DC

## 2015-02-05 NOTE — Progress Notes (Signed)
Primary Care Physician:  Maricela Curet, MD Primary Gastroenterologist:  Dr. Oneida Alar  Chief Complaint  Patient presents with  . Diarrhea  . Bloated    HPI:   54 year old female referred by occupational in urgent care center. Referring physicians notes reviewed last visit there 01/08/2015 with the patient was complaining of lower abdominal pain with diarrhea and nausea and vomiting 1. Her left lower quadrant was tender at that time and she was diagnosed with diverticulitis and chronic bloating/nausea. She was prescribed Cipro and Flagyl as well as Imodium and referred to GI. No record of colonoscopy in the system. Last CT of the abdomen completed in 2008.  Today she states last night she woke up with burning epigastric pain along with some nausea. Overall her symptoms have been bloating and nausea for about 8 months. Complains of abdominal swelling and 45 pound weight gain. Denies sodas, sweets, and overt changes in her diet. Has also had on/off diarrhea for years, has been told she has UC and then IBS, and now diverticulitis. Has also had lower abdominal pain for the past 8 months as well which is described as sharp. Denies fever, chills, unintentional weight loss. Has had change in bowel habits in the past few days where it was more frequent and with some noted blood. At the time her stool was loose with bright red blood. Had not seen blood previously. Denies melena. Has been recently diagnosed with COPD. Also with some dizzy spells which her father also suffers from. Has a history of "stomach problems" as a child/teen. Denies any other upper or lower GI symptoms. States if she has had a colonoscopy before it was so many years ago she doesn't remember.  Past Medical History  Diagnosis Date  . Hypothermia   . Hypertension   . Arthritis     Past Surgical History  Procedure Laterality Date  . Abdominal hysterectomy    . Tonsillectomy      No current outpatient prescriptions on  file.   No current facility-administered medications for this visit.    Allergies as of 02/05/2015 - Review Complete 02/05/2015  Allergen Reaction Noted  . Benadryl [diphenhydramine hcl]  10/12/2011  . Iohexol  06/30/2007  . Penicillins Rash 02/13/2013    No family history on file.  History   Social History  . Marital Status: Legally Separated    Spouse Name: N/A  . Number of Children: N/A  . Years of Education: N/A   Occupational History  . Not on file.   Social History Main Topics  . Smoking status: Never Smoker   . Smokeless tobacco: Not on file  . Alcohol Use: No  . Drug Use: No  . Sexual Activity: Not on file   Other Topics Concern  . Not on file   Social History Narrative    Review of Systems: 10 point ROS negative except as per HPI.    Physical Exam: BP 174/113 mmHg  Pulse 83  Temp(Src) 98.1 F (36.7 C) (Oral)  Ht 5\' 3"  (1.6 m)  Wt 170 lb (77.111 kg)  BMI 30.12 kg/m2 General:   Alert and oriented. Pleasant and cooperative. Well-nourished and well-developed.  Head:  Normocephalic and atraumatic. Eyes:  Without icterus, sclera clear and conjunctiva pink.  Ears:  Normal auditory acuity. Cardiovascular:  S1, S2 present without murmurs appreciated. Normal pulses noted. Extremities without clubbing or edema. Respiratory:  Clear to auscultation bilaterally. No wheezes, rales, or rhonchi. No distress.  Gastrointestinal:  +BS, soft, and non-distended.  Mild abdominal TTP. No HSM noted. No guarding or rebound. No masses appreciated.  Rectal:  Deferred  Neurologic:  Alert and oriented x4;  grossly normal neurologically. Psych:  Alert and cooperative. Normal mood and affect. Heme/Lymph/Immune: No excessive bruising noted.    02/05/2015 8:58 AM

## 2015-02-05 NOTE — Patient Instructions (Addendum)
1. Abstain from dairy products before your procedure. 2. Have your labs drawn when you're able. 3. We will schedule your procedure for you. 4. Return for follow-up in 3 months.

## 2015-02-06 LAB — IGA: IgA: 338 mg/dL (ref 69–380)

## 2015-02-07 LAB — TISSUE TRANSGLUTAMINASE, IGA: TISSUE TRANSGLUTAMINASE AB, IGA: 1 U/mL (ref <4)

## 2015-02-08 ENCOUNTER — Encounter (HOSPITAL_COMMUNITY): Payer: Self-pay | Admitting: *Deleted

## 2015-02-08 NOTE — Assessment & Plan Note (Signed)
Patient with a long standing history of nausea and bloating. Has been diagnosed with various ailments including UC and IBS. Has never had a colonoscopy that she can remember. Possible lactose intolerance, gluten sensitivity, pancreatic insufficiency. Cannot rule out more insideous process. Will check CBC, CMP, TTG IgA, Total IgA, pancreatic elastase, and recommend abstaining from daily. Will also plan for colonoscopy to further evaluate.  Proceed with colonoscopy with Dr. Oneida Alar in the near future. The risks, benefits, and alternatives have been discussed in detail with the patient. They state understanding and desire to proceed.   Patient is not on any anticoagulants, antidepressants, anxiolytics, or chronic pain medications.

## 2015-02-08 NOTE — Assessment & Plan Note (Signed)
Patient with abdominal pain, nausea, bloating. She has been diagnosed with various GI ailments over the years, Was recently diagnosed with diverticulitis and started on cipro and flagyl by urgent care. Has had hematochezia with loose stools. Unsure if she's had a colonoscopy. Given her presentation and no known colonoscopy will move forward with a colonoscopy. Will also check CBC, CMP, TTG IgA, Total IgA, and fecal elastase. Will also recommend she abstain from dairy products until her procedure to test for dairy intolerance. Return for follow-up in 3 months.  Proceed with colonoscopy with Dr. Oneida Alar in the near future. The risks, benefits, and alternatives have been discussed in detail with the patient. They state understanding and desire to proceed.   Patient is not on any anticoagulants, antidepressants, anxiolytics, or chronic pain medications.

## 2015-02-11 ENCOUNTER — Encounter (HOSPITAL_COMMUNITY)
Admission: RE | Disposition: A | Discharge status: Home / Self Care | Payer: Self-pay | Source: Ambulatory Visit | Attending: Gastroenterology

## 2015-02-11 ENCOUNTER — Ambulatory Visit (HOSPITAL_COMMUNITY)
Admission: RE | Admit: 2015-02-11 | Discharge: 2015-02-11 | Disposition: A | Discharge status: Home / Self Care | Payer: 59 | Source: Ambulatory Visit | Attending: Gastroenterology | Admitting: Gastroenterology

## 2015-02-11 ENCOUNTER — Encounter (HOSPITAL_COMMUNITY): Payer: Self-pay | Admitting: *Deleted

## 2015-02-11 ENCOUNTER — Encounter: Payer: Self-pay | Admitting: Nurse Practitioner

## 2015-02-11 DIAGNOSIS — K648 Other hemorrhoids: Secondary | ICD-10-CM | POA: Insufficient documentation

## 2015-02-11 DIAGNOSIS — K921 Melena: Secondary | ICD-10-CM

## 2015-02-11 DIAGNOSIS — R109 Unspecified abdominal pain: Secondary | ICD-10-CM | POA: Insufficient documentation

## 2015-02-11 DIAGNOSIS — K5730 Diverticulosis of large intestine without perforation or abscess without bleeding: Secondary | ICD-10-CM | POA: Diagnosis not present

## 2015-02-11 DIAGNOSIS — K210 Gastro-esophageal reflux disease with esophagitis: Secondary | ICD-10-CM | POA: Insufficient documentation

## 2015-02-11 DIAGNOSIS — R1310 Dysphagia, unspecified: Secondary | ICD-10-CM | POA: Insufficient documentation

## 2015-02-11 DIAGNOSIS — K2970 Gastritis, unspecified, without bleeding: Secondary | ICD-10-CM | POA: Diagnosis not present

## 2015-02-11 DIAGNOSIS — R140 Abdominal distension (gaseous): Secondary | ICD-10-CM

## 2015-02-11 DIAGNOSIS — I10 Essential (primary) hypertension: Secondary | ICD-10-CM | POA: Diagnosis not present

## 2015-02-11 DIAGNOSIS — K2210 Ulcer of esophagus without bleeding: Secondary | ICD-10-CM

## 2015-02-11 DIAGNOSIS — R1013 Epigastric pain: Secondary | ICD-10-CM | POA: Insufficient documentation

## 2015-02-11 DIAGNOSIS — K222 Esophageal obstruction: Secondary | ICD-10-CM | POA: Diagnosis not present

## 2015-02-11 DIAGNOSIS — K625 Hemorrhage of anus and rectum: Secondary | ICD-10-CM | POA: Insufficient documentation

## 2015-02-11 DIAGNOSIS — J449 Chronic obstructive pulmonary disease, unspecified: Secondary | ICD-10-CM | POA: Diagnosis not present

## 2015-02-11 HISTORY — DX: Malignant hyperthermia due to anesthesia, initial encounter: T88.3XXA

## 2015-02-11 HISTORY — PX: ESOPHAGEAL DILATION: SHX303

## 2015-02-11 HISTORY — PX: COLONOSCOPY: SHX5424

## 2015-02-11 HISTORY — PX: ESOPHAGOGASTRODUODENOSCOPY: SHX5428

## 2015-02-11 SURGERY — COLONOSCOPY
Anesthesia: Moderate Sedation

## 2015-02-11 MED ORDER — STERILE WATER FOR IRRIGATION IR SOLN
Status: DC | PRN
  Administered 2015-02-11: 10:00:00

## 2015-02-11 MED ORDER — MIDAZOLAM HCL 5 MG/5ML IJ SOLN
INTRAMUSCULAR | Status: AC
  Filled 2015-02-11: qty 10

## 2015-02-11 MED ORDER — MEPERIDINE HCL 100 MG/ML IJ SOLN
INTRAMUSCULAR | Status: DC | PRN
  Administered 2015-02-11 (×2): 25 mg via INTRAVENOUS
  Administered 2015-02-11: 50 mg via INTRAVENOUS

## 2015-02-11 MED ORDER — MEPERIDINE HCL 100 MG/ML IJ SOLN
INTRAMUSCULAR | Status: AC
  Filled 2015-02-11: qty 2

## 2015-02-11 MED ORDER — OMEPRAZOLE 20 MG PO CPDR: DELAYED_RELEASE_CAPSULE | ORAL | Status: DC

## 2015-02-11 MED ORDER — SODIUM CHLORIDE 0.9 % IV SOLN
INTRAVENOUS | Status: DC
  Administered 2015-02-11: 09:00:00 via INTRAVENOUS

## 2015-02-11 MED ORDER — MINERAL OIL PO OIL
TOPICAL_OIL | ORAL | Status: AC
  Filled 2015-02-11: qty 30

## 2015-02-11 MED ORDER — LIDOCAINE VISCOUS 2 % MT SOLN
OROMUCOSAL | Status: AC
  Filled 2015-02-11: qty 15

## 2015-02-11 MED ORDER — MIDAZOLAM HCL 5 MG/5ML IJ SOLN
INTRAMUSCULAR | Status: DC | PRN
  Administered 2015-02-11: 2 mg via INTRAVENOUS
  Administered 2015-02-11 (×3): 1 mg via INTRAVENOUS
  Administered 2015-02-11: 2 mg via INTRAVENOUS

## 2015-02-11 NOTE — OR Nursing (Signed)
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 Said ok to proceed with procedure.

## 2015-02-11 NOTE — Discharge Instructions (Signed)
You did not have any polyps removed. You have internal hemorrhoids and diverticulosis IN YOUR SIGMOID COLON. I dilated your esophagus DUE TO A STRICTURE AT NEAR THE BASE OF YOUR ESOPHAGUS.YOU HAVE ESOPHAGITIS, A SMALL HIATAL HERNIA, AND GASTRITIS. I BIOPSIED YOUR STOMACH, SMALL BOWEL, COLON, AND RECTUM.   DRINK WATER TO KEEP YOUR URINE LIGHT YELLOW.  FOLLOW A HIGH FIBER/LOW FAT DIET. SEE INFO BELOW ON A LOW FAT DIET.  START OMEPRAZOLE.  TAKE 30 MINUTES PRIOR TO YOUR MEALS TWICE DAILY.  YOUR BIOPSY RESULTS WILL BE AVAILABLE IN MY CHART AFTER AUG 3 AND MY OFFICE WILL CONTACT YOU IN 10-14 DAYS WITH YOUR RESULTS.   FOLLOW UP IN 3 MOS. May 08, 2015 at 8:30AM with Randall Hiss at Dr. Oneida Alar office.  Next colonoscopy in 10 years.     ENDOSCOPY Care After Read the instructions outlined below and refer to this sheet in the next week. These discharge instructions provide you with general information on caring for yourself after you leave the hospital. While your treatment has been planned according to the most current medical practices available, unavoidable complications occasionally occur. If you have any problems or questions after discharge, call DR. FIELDS, [phone].  ACTIVITY  You may resume your regular activity, but move at a slower pace for the next 24 hours.   Take frequent rest periods for the next 24 hours.   Walking will help get rid of the air and reduce the bloated feeling in your belly (abdomen).   No driving for 24 hours (because of the medicine (anesthesia) used during the test).   You may shower.   Do not sign any important legal documents or operate any machinery for 24 hours (because of the anesthesia used during the test).    NUTRITION  Drink plenty of fluids.   You may resume your normal diet as instructed by your doctor.   Begin with a light meal and progress to your normal diet. Heavy or fried foods are harder to digest and may make you feel sick to your  stomach (nauseated).   Avoid alcoholic beverages for 24 hours or as instructed.    MEDICATIONS  You may resume your normal medications.   WHAT YOU CAN EXPECT TODAY  Some feelings of bloating in the abdomen.   Passage of more gas than usual.   Spotting of blood in your stool or on the toilet paper  .  IF YOU HAD POLYPS REMOVED DURING THE ENDOSCOPY:  Eat a soft diet IF YOU HAVE NAUSEA, BLOATING, ABDOMINAL PAIN, OR VOMITING.    FINDING OUT THE RESULTS OF YOUR TEST Not all test results are available during your visit. DR. Oneida Alar WILL CALL YOU WITHIN 14 DAYS OF YOUR PROCEDUE WITH YOUR RESULTS. Do not assume everything is normal if you have not heard from DR. FIELDS, CALL HER OFFICE AT [phone].  SEEK IMMEDIATE MEDICAL ATTENTION AND CALL THE OFFICE: [phone] IF:  You have more than a spotting of blood in your stool.   Your belly is swollen (abdominal distention).   You are nauseated or vomiting.   You have a temperature over 101F.   You have abdominal pain or discomfort that is severe or gets worse throughout the day.  GERD  Common symptoms of GERD are heartburn (burning in your chest). This is worse when lying down or bending over. It may also cause belching and indigestion. Some of the things which make GERD worse are:  Increased weight pushes on stomach making acid rise more  easily.   Smoking markedly increases acid production.   Alcohol decreases lower esophageal sphincter pressure (valve between stomach and esophagus), allowing acid from stomach into esophagus.   Late evening meals and going to bed with a full stomach increases pressure.     HOME CARE INSTRUCTIONS  Try to achieve and maintain an ideal body weight.   Avoid drinking alcoholic beverages.   DO NOT smokE.   Do not wear tight clothing around your chest or stomach.   Eat smaller meals and eat more frequently. This keeps your stomach from getting too full. Eat slowly.   Do not lie down  for 2 or 3 hours after eating. Do not eat or drink anything 1 to 2 hours before going to bed.   Avoid caffeine beverages (colas, coffee, cocoa, tea), fatty foods, citrus fruits and all other foods and drinks that contain acid and that seem to increase the problems.   Avoid bending over, especially after eating OR STRAINING. Anything that increases the pressure in your belly increases the amount of acid that may be pushed up into your esophagus.     Diverticulosis Diverticulosis is a common condition that develops when small pouches (diverticula) form in the wall of the colon. The risk of diverticulosis increases with age. It happens more often in people who eat a low-fiber diet. Most individuals with diverticulosis have no symptoms. Those individuals with symptoms usually experience belly (abdominal) pain, constipation, or loose stools (diarrhea).  HOME CARE INSTRUCTIONS  Increase the amount of fiber in your diet as directed by your caregiver or dietician. This may reduce symptoms of diverticulosis.   Drink at least 6 to 8 glasses of water each day to prevent constipation.   Try not to strain when you have a bowel movement.   Avoiding nuts and seeds to prevent complications is NOT NECESSARY.   FOODS HAVING HIGH FIBER CONTENT INCLUDE:  Fruits. Apple, peach, pear, tangerine, raisins, prunes.   Vegetables. Brussels sprouts, asparagus, broccoli, cabbage, carrot, cauliflower, romaine lettuce, spinach, summer squash, tomato, winter squash, zucchini.   Starchy Vegetables. Baked beans, kidney beans, lima beans, split peas, lentils, potatoes (with skin).   Grains. Whole wheat bread, brown rice, bran flake cereal, plain oatmeal, white rice, shredded wheat, bran muffins.   Hemorrhoids Hemorrhoids are dilated (enlarged) veins around the rectum. Sometimes clots will form in the veins. This makes them swollen and painful. These are called thrombosed hemorrhoids. Causes of hemorrhoids  include:  Constipation.   Straining to have a bowel movement.   HEAVY LIFTING  HOME CARE INSTRUCTIONS  Eat a well balanced diet and drink 6 to 8 glasses of water every day to avoid constipation. You may also use a bulk laxative.   Avoid straining to have bowel movements.   Keep anal area dry and clean.   Do not use a donut shaped pillow or sit on the toilet for long periods. This increases blood pooling and pain.   Move your bowels when your body has the urge; this will require less straining and will decrease pain and pressure.   Low-Fat Diet BREADS, CEREALS, PASTA, RICE, DRIED PEAS, AND BEANS These products are high in carbohydrates and most are low in fat. Therefore, they can be increased in the diet as substitutes for fatty foods. They too, however, contain calories and should not be eaten in excess. Cereals can be eaten for snacks as well as for breakfast.   FRUITS AND VEGETABLES It is good to eat fruits and vegetables. Besides  being sources of fiber, both are rich in vitamins and some minerals. They help you get the daily allowances of these nutrients. Fruits and vegetables can be used for snacks and desserts.  MEATS Limit lean meat, chicken, [place], and fish to no more than 6 ounces per day. Beef, Pork, and Lamb Use lean cuts of beef, pork, and lamb. Lean cuts include:  Extra-lean ground beef.  Arm roast.  Sirloin tip.  Center-cut ham.  Round steak.  Loin chops.  Rump roast.  Tenderloin.  Trim all fat off the outside of meats before cooking. It is not necessary to severely decrease the intake of red meat, but lean choices should be made. Lean meat is rich in protein and contains a highly absorbable form of iron. Premenopausal women, in particular, should avoid reducing lean red meat because this could increase the risk for low red blood cells (iron-deficiency anemia).  Chicken and [place] These are good sources of protein. The fat of poultry can be reduced by removing  the skin and underlying fat layers before cooking. Chicken and [place] can be substituted for lean red meat in the diet. Poultry should not be fried or covered with high-fat sauces. Fish and Shellfish Fish is a good source of protein. Shellfish contain cholesterol, but they usually are low in saturated fatty acids. The preparation of fish is important. Like chicken and [place], they should not be fried or covered with high-fat sauces. EGGS Egg whites contain no fat or cholesterol. They can be eaten often. Try 1 to 2 egg whites instead of whole eggs in recipes or use egg substitutes that do not contain yolk. MILK AND DAIRY PRODUCTS Use skim or 1% milk instead of 2% or whole milk. Decrease whole milk, natural, and processed cheeses. Use nonfat or low-fat (2%) cottage cheese or low-fat cheeses made from vegetable oils. Choose nonfat or low-fat (1 to 2%) yogurt. Experiment with evaporated skim milk in recipes that call for heavy cream. Substitute low-fat yogurt or low-fat cottage cheese for sour cream in dips and salad dressings. Have at least 2 servings of low-fat dairy products, such as 2 glasses of skim (or 1%) milk each day to help get your daily calcium intake. FATS AND OILS Reduce the total intake of fats, especially saturated fat. Butterfat, lard, and beef fats are high in saturated fat and cholesterol. These should be avoided as much as possible. Vegetable fats do not contain cholesterol, but certain vegetable fats, such as coconut oil, palm oil, and palm kernel oil are very high in saturated fats. These should be limited. These fats are often used in bakery goods, processed foods, popcorn, oils, and nondairy creamers. Vegetable shortenings and some peanut butters contain hydrogenated oils, which are also saturated fats. Read the labels on these foods and check for saturated vegetable oils. Unsaturated vegetable oils and fats do not raise blood cholesterol. However, they should be limited because they are  fats and are high in calories. Total fat should still be limited to 30% of your daily caloric intake. Desirable liquid vegetable oils are corn oil, cottonseed oil, olive oil, canola oil, safflower oil, soybean oil, and sunflower oil. Peanut oil is not as good, but small amounts are acceptable. Buy a heart-healthy tub margarine that has no partially hydrogenated oils in the ingredients. Mayonnaise and salad dressings often are made from unsaturated fats, but they should also be limited because of their high calorie and fat content. Seeds, nuts, peanut butter, olives, and avocados are high in fat,  but the fat is mainly the unsaturated type. These foods should be limited mainly to avoid excess calories and fat. OTHER EATING TIPS Snacks  Most sweets should be limited as snacks. They tend to be rich in calories and fats, and their caloric content outweighs their nutritional value. Some good choices in snacks are graham crackers, melba toast, soda crackers, bagels (no egg), English muffins, fruits, and vegetables. These snacks are preferable to snack crackers, [place] fries, TORTILLA CHIPS, and POTATO chips. Popcorn should be air-popped or cooked in small amounts of liquid vegetable oil. Desserts Eat fruit, low-fat yogurt, and fruit ices instead of pastries, cake, and cookies. Sherbet, angel food cake, gelatin dessert, frozen low-fat yogurt, or other frozen products that do not contain saturated fat (pure fruit juice bars, frozen ice pops) are also acceptable.  COOKING METHODS Choose those methods that use little or no fat. They include: Poaching.  Braising.  Steaming.  Grilling.  Baking.  Stir-frying.  Broiling.  Microwaving.  Foods can be cooked in a nonstick pan without added fat, or use a nonfat cooking spray in regular cookware. Limit fried foods and avoid frying in saturated fat. Add moisture to lean meats by using water, broth, cooking wines, and other nonfat or low-fat sauces along with the cooking  methods mentioned above. Soups and stews should be chilled after cooking. The fat that forms on top after a few hours in the refrigerator should be skimmed off. When preparing meals, avoid using excess salt. Salt can contribute to raising blood pressure in some people.  EATING AWAY FROM HOME Order entres, potatoes, and vegetables without sauces or butter. When meat exceeds the size of a deck of cards (3 to 4 ounces), the rest can be taken home for another meal. Choose vegetable or fruit salads and ask for low-calorie salad dressings to be served on the side. Use dressings sparingly. Limit high-fat toppings, such as bacon, crumbled eggs, cheese, sunflower seeds, and olives. Ask for heart-healthy tub margarine instead of butter.

## 2015-02-11 NOTE — Op Note (Addendum)
[facility] [address] [place], [address]   ENDOSCOPY PROCEDURE REPORT  PATIENT: Vanessa Mcconnell, Vanessa Mcconnell  MR#: [id number] BIRTHDATE: 1961-06-21 , 15  yrs. old GENDER: female  ENDOSCOPIST: Danie Binder, MD REFFERED [id number] Cindie Laroche, M.D.  PROCEDURE DATE:  2015/02/24 PROCEDURE:   EGD with biopsy and EGD with dilatation over guidewire   INDICATIONS:1.  dysphagia.   2.  dyspepsia. 3. CHRONIC WATERY/LOOSE STOOLS. PMHx:? UC MEDICATIONS: TCS+ Versed 1 mg IV TOPICAL ANESTHETIC: Viscous Xylocaine  DESCRIPTION OF PROCEDURE:   After the risks benefits and alternatives of the procedure were thoroughly explained, informed consent was obtained.  The EG-[id number] ([id number])  endoscope was introduced through the mouth and advanced to the second portion of the duodenum. The instrument was slowly withdrawn as the mucosa was carefully examined.  Prior to withdrawal of the scope, the guidwire was placed.  The esophagus was dilated successfully.  The patient was recovered in endoscopy and discharged home in satisfactory condition. Estimated blood loss is zero unless otherwise noted in this procedure report.   ESOPHAGUS: MULTIPLE 1-3 CM LINEAR EROSIONS/ULCERS IN DISTAL ESOPHAGUS.  PATENT PEPTIC STRICTURE.   STOMACH: MODERATE SIZE HIATAL HERNIA.   Mild non-erosive gastritis (inflammation) was found in the gastric antrum.  Multiple biopsies were performed using cold forceps.   DUODENUM: The duodenal mucosa showed no abnormalities in the bulb and second portion of the duodenum.  Cold forceps biopsies were taken in the bulb and second portion. Dilation was then performed at the gastroesphageal junction Dilator: Savary over guidewire Size(s): 12.8-16 MM Resistance: moderate Heme: yes  COMPLICATIONS: There were no immediate complications.  ENDOSCOPIC IMPRESSION: 1.   REFLUX ESOPHAGITIS AND A PATENT PEPTIC STRICTURE 2.   MODERATE SIZE HIATAL HERNIA 3.   MILD Non-erosive  gastritis  RECOMMENDATIONS: DRINK WATER. FOLLOW A HIGH FIBER/LOW FAT DIET. START OMEPRAZOLE 30 MINUTES PRIOR TO MEALS BID. AWAIT BIOPSY RESULTS. FOLLOW May 08, 2015 at 8:30AM with Randall Hiss at Dr.  Oneida Alar office. Next colonoscopy in 10 years WITH AN OVERTUBE.  eSigned:  Danie Binder, MD Feb 24, 2015 5:57 PM   CPT CODES: ICD CODES:  The ICD and CPT codes recommended by this software are interpretations from the data that the clinical staff has captured with the software.  The verification of the translation of this report to the ICD and CPT codes and modifiers is the sole responsibility of the health care institution and practicing physician where this report was generated.  [employer]. will not be held responsible for the validity of the ICD and CPT codes included on this report.  AMA assumes no liability for data contained or not contained herein. CPT is a Designer, television/film set of the [employer].

## 2015-02-11 NOTE — Op Note (Signed)
[facility] [address] [place], [address]   COLONOSCOPY PROCEDURE REPORT  PATIENT: Vanessa Mcconnell, Vanessa Mcconnell  MR#: [id number] BIRTHDATE: 1960/07/16 , 62  yrs. old GENDER: female ENDOSCOPIST: Danie Binder, MD REFERRED HR:[id number] Cindie Laroche, M.D. PROCEDURE DATE:  March 09, 2015 PROCEDURE:   Colonoscopy with biopsy INDICATIONS:RECTAL BLEEDING, ABDOMINAL PAIN, AND CHRONIC WATERY/LOOSE STOOLS. PMHx: POSSIBLE UC. MEDICATIONS: Demerol 100 mg IV and Versed 6 mg IV  DESCRIPTION OF PROCEDURE:    Physical exam was performed.  Informed consent was obtained from the patient after explaining the benefits, risks, and alternatives to procedure.  The patient was connected to monitor and placed in left lateral position. Continuous oxygen was provided by nasal cannula and IV medicine administered through an indwelling cannula.  After administration of sedation and rectal exam, the patients rectum was intubated and the [id number] ([id number])  colonoscope was advanced under direct visualization to the ileum.  The scope was removed slowly by carefully examining the color, texture, anatomy, and integrity mucosa on the way out.  The patient was recovered in endoscopy and discharged home in satisfactory condition. Estimated blood loss is zero unless otherwise noted in this procedure report.    COLON FINDINGS: The examined terminal ileum appeared to be normal. Multiple biopsies were performed.  , There was moderate diverticulosis noted in the sigmoid colon with associated tortuosity and muscular hypertrophy.  , The colonic mucosa appeared normal in the descending colon, transverse colon, and ascending colon.  Multiple biopsies were performed using cold forceps IN THE RIGHT AND LEFT COLON AND RECTUM. Moderate sized internal hemorrhoids were found.  PREP QUALITY: excellent. CECAL W/D TIME: 14       minutes COMPLICATIONS: None  ENDOSCOPIC IMPRESSION: 1.   NO OBVIOUS SOURCE FOR ABDOMINAL PAIN  AND LOOSE STOOLS IDENTIFIED 2.   Moderate diverticulosis in the sigmoid colon 3.   RECTAL BLEEDING DUE TO Moderate sized internal hemorrhoids  RECOMMENDATIONS: DRINK WATER. FOLLOW A HIGH FIBER/LOW FAT DIET. START OMEPRAZOLE 30 MINUTES PRIOR TO MEALS BID. AWAIT BIOPSY RESULTS. FOLLOW May 08, 2015 at 8:30AM with Randall Hiss at Dr.  Oneida Alar office. Next colonoscopy in 10 years WITH AN OVERTUBE.    _______________________________ eSignedDanie Binder, MD 2015/03/09 6:05 PM   CPT CODES: ICD CODES:  The ICD and CPT codes recommended by this software are interpretations from the data that the clinical staff has captured with the software.  The verification of the translation of this report to the ICD and CPT codes and modifiers is the sole responsibility of the health care institution and practicing physician where this report was generated.  [employer] will not be held responsible for the validity of the ICD and CPT codes included on this report.  AMA assumes no liability for data contained or not contained herein. CPT is a Designer, television/film set of the [employer].

## 2015-02-11 NOTE — H&P (Addendum)
  Primary Care Physician:  Maricela Curet, MD Primary Gastroenterologist:  Dr. Oneida Alar  Pre-Procedure History & Physical: HPI:  Vanessa Mcconnell is a 54 y.o. female here for BRBPR/DYSPEPSIA/DYSPHAGIA.  Past Medical History  Diagnosis Date  . Hypertension   . Arthritis   . Asthma   . COPD (chronic obstructive pulmonary disease)   . Pancreatitis     as a child x 1 episode (per patient)  . Complication of anesthesia   . 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 Abdominal hysterectomy    . Tonsillectomy    . Ulner nerve surgery      Right Elbow  . Debridement leg      cellulitis debridement as a teen    Prior to Admission medications   Medication Sig Start Date End Date Taking? Authorizing Provider  ibuprofen (ADVIL,MOTRIN) 200 MG tablet Take 800 mg by mouth every 6 (six) hours as needed for headache or moderate pain.   Yes Historical Provider, MD  polyethylene glycol-electrolytes (NULYTELY/GOLYTELY) 420 G solution Take 4,000 mLs by mouth once. 02/05/15  Yes Carlis Stable, NP    Allergies as of 02/05/2015 - Review Complete 02/05/2015  Allergen Reaction Noted  . Benadryl [diphenhydramine hcl] Hives and Other (See Comments) 10/12/2011  . Erythromycin Hives 02/05/2015  . Iohexol  06/30/2007  . Penicillins Rash 02/13/2013    Family History  Problem Relation Age of Onset  . Colon cancer Neg Hx     History   Social History  . Marital Status: Married    Spouse Name: N/A  . Number of Children: N/A  . Years of Education: N/A   Occupational History  . Not on file.   Social History Main Topics  . Smoking status: Never Smoker   . Smokeless tobacco: Never Used  . Alcohol Use: No  . Drug Use: No  . Sexual Activity: Not on file   Other Topics Concern  . Not on file   Social History Narrative    Review of Systems: See HPI, otherwise negative ROS   Physical Exam: BP 173/96 mmHg  Pulse 82   Temp(Src) 98.1 F (36.7 C) (Oral)  Resp 11  Ht 5\' 3"  (1.6 m)  Wt 170 lb (77.111 kg)  BMI 30.12 kg/m2  SpO2 98% General:   Alert,  pleasant and cooperative in NAD Head:  Normocephalic and atraumatic. Neck:  Supple; Lungs:  Clear throughout to auscultation.    Heart:  Regular rate and rhythm. Abdomen:  Soft, nontender and nondistended. Normal bowel sounds, without guarding, and without rebound.   Neurologic:  Alert and  oriented x4;  grossly normal neurologically.  Impression/Plan:   BRBPR/DYSPEPSIA/DYSPHAGIA  PLAN: EGD/POSSIBLE DILATION & TCS TODAY

## 2015-02-11 NOTE — Progress Notes (Signed)
REVIEWED-NO ADDITIONAL RECOMMENDATIONS. 

## 2015-02-12 NOTE — Progress Notes (Signed)
CC'ED TO PCP 

## 2015-02-13 ENCOUNTER — Emergency Department (HOSPITAL_COMMUNITY): Payer: 59

## 2015-02-13 ENCOUNTER — Telehealth: Payer: Self-pay

## 2015-02-13 ENCOUNTER — Emergency Department (HOSPITAL_COMMUNITY)
Admission: EM | Admit: 2015-02-13 | Discharge: 2015-02-13 | Disposition: A | Discharge status: Home / Self Care | Payer: 59 | Attending: Emergency Medicine | Admitting: Emergency Medicine

## 2015-02-13 ENCOUNTER — Encounter (HOSPITAL_COMMUNITY): Payer: Self-pay

## 2015-02-13 DIAGNOSIS — M1990 Unspecified osteoarthritis, unspecified site: Secondary | ICD-10-CM | POA: Diagnosis not present

## 2015-02-13 DIAGNOSIS — N200 Calculus of kidney: Secondary | ICD-10-CM | POA: Insufficient documentation

## 2015-02-13 DIAGNOSIS — R109 Unspecified abdominal pain: Secondary | ICD-10-CM

## 2015-02-13 DIAGNOSIS — Z90710 Acquired absence of both cervix and uterus: Secondary | ICD-10-CM | POA: Insufficient documentation

## 2015-02-13 DIAGNOSIS — Z8719 Personal history of other diseases of the digestive system: Secondary | ICD-10-CM | POA: Insufficient documentation

## 2015-02-13 DIAGNOSIS — R197 Diarrhea, unspecified: Secondary | ICD-10-CM | POA: Insufficient documentation

## 2015-02-13 DIAGNOSIS — I10 Essential (primary) hypertension: Secondary | ICD-10-CM | POA: Diagnosis not present

## 2015-02-13 DIAGNOSIS — J449 Chronic obstructive pulmonary disease, unspecified: Secondary | ICD-10-CM | POA: Insufficient documentation

## 2015-02-13 DIAGNOSIS — Z880 Allergy status to penicillin: Secondary | ICD-10-CM | POA: Diagnosis not present

## 2015-02-13 LAB — CBC WITH DIFFERENTIAL/PLATELET
BASOS PCT: 1 % (ref 0–1)
Basophils Absolute: 0 10*3/uL (ref 0.0–0.1)
EOS ABS: 0.1 10*3/uL (ref 0.0–0.7)
Eosinophils Relative: 2 % (ref 0–5)
HEMATOCRIT: 43.5 % (ref 36.0–46.0)
HEMOGLOBIN: 14.9 g/dL (ref 12.0–15.0)
LYMPHS ABS: 1.5 10*3/uL (ref 0.7–4.0)
LYMPHS PCT: 24 % (ref 12–46)
MCH: 30.4 pg (ref 26.0–34.0)
MCHC: 34.3 g/dL (ref 30.0–36.0)
MCV: 88.8 fL (ref 78.0–100.0)
Monocytes Absolute: 0.5 10*3/uL (ref 0.1–1.0)
Monocytes Relative: 7 % (ref 3–12)
NEUTROS ABS: 4.1 10*3/uL (ref 1.7–7.7)
Neutrophils Relative %: 66 % (ref 43–77)
Platelets: 261 10*3/uL (ref 150–400)
RBC: 4.9 MIL/uL (ref 3.87–5.11)
RDW: 12.8 % (ref 11.5–15.5)
WBC: 6.2 10*3/uL (ref 4.0–10.5)

## 2015-02-13 LAB — BASIC METABOLIC PANEL
Anion gap: 8 (ref 5–15)
BUN: 9 mg/dL (ref 6–20)
CALCIUM: 9.1 mg/dL (ref 8.9–10.3)
CO2: 27 mmol/L (ref 22–32)
Chloride: 105 mmol/L (ref 101–111)
Creatinine, Ser: 0.63 mg/dL (ref 0.44–1.00)
GFR calc Af Amer: >60 mL/min (ref >60)
GLUCOSE: 84 mg/dL (ref 65–99)
Potassium: 3.7 mmol/L (ref 3.5–5.1)
Sodium: 140 mmol/L (ref 135–145)

## 2015-02-13 LAB — HEPATIC FUNCTION PANEL
ALT: 16 U/L (ref 14–54)
AST: 20 U/L (ref 15–41)
Albumin: 4.1 g/dL (ref 3.5–5.0)
Alkaline Phosphatase: 110 U/L (ref 38–126)
BILIRUBIN TOTAL: 0.5 mg/dL (ref 0.3–1.2)
Bilirubin, Direct: 0.1 mg/dL (ref 0.1–0.5)
Indirect Bilirubin: 0.4 mg/dL (ref 0.3–0.9)
Total Protein: 7 g/dL (ref 6.5–8.1)

## 2015-02-13 LAB — URINE MICROSCOPIC-ADD ON

## 2015-02-13 LAB — URINALYSIS, ROUTINE W REFLEX MICROSCOPIC
BILIRUBIN URINE: NEGATIVE
Glucose, UA: NEGATIVE mg/dL
Ketones, ur: NEGATIVE mg/dL
LEUKOCYTES UA: NEGATIVE
Nitrite: NEGATIVE
Protein, ur: NEGATIVE mg/dL
Specific Gravity, Urine: <1.005 — ABNORMAL LOW (ref 1.005–1.030)
Urobilinogen, UA: 0.2 mg/dL (ref 0.0–1.0)
pH: 6 (ref 5.0–8.0)

## 2015-02-13 LAB — LIPASE, BLOOD: Lipase: 15 U/L — ABNORMAL LOW (ref 22–51)

## 2015-02-13 MED ORDER — ONDANSETRON 4 MG PO TBDP: 4.0000 mg | ORAL_TABLET | Freq: Three times a day (TID) | ORAL | Status: DC | PRN

## 2015-02-13 MED ORDER — HYDROCODONE-ACETAMINOPHEN 5-325 MG PO TABS: 2.0000 | ORAL_TABLET | Freq: Once | ORAL | Status: DC

## 2015-02-13 MED ORDER — MORPHINE SULFATE 4 MG/ML IJ SOLN
4.0000 mg | Freq: Once | INTRAMUSCULAR | Status: DC
  Filled 2015-02-13: qty 1

## 2015-02-13 MED ORDER — SODIUM CHLORIDE 0.9 % IV BOLUS (SEPSIS)
1000.0000 mL | Freq: Once | INTRAVENOUS | Status: AC
  Administered 2015-02-13: 1000 mL via INTRAVENOUS

## 2015-02-13 MED ORDER — HYDROCODONE-ACETAMINOPHEN 5-325 MG PO TABS: 1.0000 | ORAL_TABLET | Freq: Once | ORAL | Status: DC

## 2015-02-13 MED ORDER — HYDROCODONE-ACETAMINOPHEN 5-325 MG PO TABS: 1.0000 | ORAL_TABLET | ORAL | Status: DC | PRN

## 2015-02-13 MED ORDER — KETOROLAC TROMETHAMINE 30 MG/ML IJ SOLN
30.0000 mg | Freq: Once | INTRAMUSCULAR | Status: AC
  Administered 2015-02-13: 30 mg via INTRAMUSCULAR
  Filled 2015-02-13: qty 1

## 2015-02-13 MED ORDER — DEXAMETHASONE 4 MG PO TABS
10.0000 mg | ORAL_TABLET | Freq: Once | ORAL | Status: AC
  Administered 2015-02-13: 10 mg via ORAL
  Filled 2015-02-13: qty 3

## 2015-02-13 MED ORDER — ONDANSETRON HCL 4 MG/2ML IJ SOLN
4.0000 mg | Freq: Once | INTRAMUSCULAR | Status: AC
  Administered 2015-02-13: 4 mg via INTRAVENOUS
  Filled 2015-02-13: qty 2

## 2015-02-13 NOTE — ED Notes (Signed)
Pt reports left flank pain that radiates to left side of abd since last night.  Reports nausea, vomited last night.

## 2015-02-13 NOTE — Addendum Note (Signed)
Addended by: Danie Binder on: 02/13/2015 12:54 PM   Modules accepted: Orders

## 2015-02-13 NOTE — ED Provider Notes (Addendum)
CSN: [id number]     Arrival date & time 02/13/15  1016 History   First MD Initiated Contact with Patient 02/13/15 1200     Chief Complaint  Patient presents with  . Flank Pain     (Consider location/radiation/quality/duration/timing/severity/associated sxs/prior Treatment) HPI Comments: 54yo F w/ PMH including HTN, GERD, diverticulosis, kidney stones presents with left leg pain. The patient states that last night, she had a sudden onset of left flank pain that radiates around to her left abdomen. Pain is currently 7/10 in intensity and is worse with movement and better laying still. She has had associated nausea and vomiting last night. He endorses sweats and chills as well as feelings of lightheadedness. She has had urinary frequency but no pain with urination and no hematuria. However she did notice some dark specks in her urine this morning. She denies any chest pain or shortness of breath. She has chronic diarrhea but no change in her bowel movements and no blood. Earlier this week she had upper endoscopy with stricture dilation and colonoscopy to evaluate her known diverticulosis. She finished treatment for diverticulitis approximately 1-2 weeks ago. No immediate complications w/ procedures.  Patient is a 54 y.o. female presenting with flank pain. The history is provided by the patient.  Flank Pain    Past Medical History  Diagnosis Date  . Hypertension   . Arthritis   . Asthma   . COPD (chronic obstructive pulmonary disease)   . Pancreatitis     as a child x 1 episode (per patient)  . Complication of anesthesia   . Malignant hyperthermia     AGE 3 DURING TONSILLECTOMY   Past Surgical History  Procedure Laterality Date  . Abdominal hysterectomy    . Tonsillectomy    . Ulner nerve surgery      Right Elbow  . Debridement leg      cellulitis debridement as a teen   Family History  Problem Relation Age of Onset  . Colon cancer Neg Hx    History  Substance Use Topics  .  Smoking status: Never Smoker   . Smokeless tobacco: Never Used  . Alcohol Use: No   OB History    No data available     Review of Systems  Genitourinary: Positive for flank pain.    10 Systems reviewed and are negative for acute change except as noted in the HPI.   Allergies  Benadryl; Erythromycin; Iohexol; and Penicillins  Home Medications   Prior to Admission medications   Medication Sig Start Date End Date Taking? Authorizing Provider  ibuprofen (ADVIL,MOTRIN) 200 MG tablet Take 800 mg by mouth every 6 (six) hours as needed for headache or moderate pain.   Yes Historical Provider, MD  omeprazole (PRILOSEC) 20 MG capsule 1 PO 30 mins prior to breakfast and supper Patient taking differently: Take 20 mg by mouth daily. 1 PO 30 mins prior to breakfast and supper 02/11/15  Yes Danie Binder, MD  HYDROcodone-acetaminophen (NORCO/VICODIN) 5-325 MG per tablet Take 1 tablet by mouth every 4 (four) hours as needed. 02/13/15   Sharlett Iles, MD  ondansetron (ZOFRAN ODT) 4 MG disintegrating tablet Take 1 tablet (4 mg total) by mouth every 8 (eight) hours as needed for nausea or vomiting. 02/13/15   Wenda Overland Azeneth Carbonell, MD   BP 181/98 mmHg  Pulse 84  Temp(Src) 98 F (36.7 C) (Oral)  Resp 18  Ht 5\' 3"  (1.6 m)  Wt 167 lb 14.4 oz (76.159 kg)  BMI 29.75 kg/m2  SpO2 97% Physical Exam  Constitutional: She is oriented to person, place, and time. She appears well-developed and well-nourished. No distress.  HENT:  Head: Normocephalic and atraumatic.  Moist mucous membranes  Eyes: Conjunctivae are normal. Pupils are equal, round, and reactive to light.  Neck: Neck supple.  Cardiovascular: Normal rate, regular rhythm and normal heart sounds.   No murmur heard. Pulmonary/Chest: Effort normal and breath sounds normal.  Abdominal: Soft. Bowel sounds are normal. She exhibits no distension. There is no tenderness.  Genitourinary:  + L CVA tenderness  Musculoskeletal: She exhibits no  edema.  Neurological: She is alert and oriented to person, place, and time.  Fluent speech  Skin: Skin is warm and dry.  Psychiatric: She has a normal mood and affect. Judgment normal.  pleasant  Nursing note and vitals reviewed.   ED Course  Procedures (including critical care time) Labs Review Labs Reviewed  URINALYSIS, ROUTINE W REFLEX MICROSCOPIC (NOT AT [facility]) - Abnormal; Notable for the following:    Color, Urine STRAW (*)    Specific Gravity, Urine <1.005 (*)    Hgb urine dipstick SMALL (*)    All other components within normal limits  LIPASE, BLOOD - Abnormal; Notable for the following:    Lipase 15 (*)    All other components within normal limits  URINE MICROSCOPIC-ADD ON - Abnormal; Notable for the following:    Squamous Epithelial / LPF FEW (*)    All other components within normal limits  URINE CULTURE  CBC WITH DIFFERENTIAL/PLATELET  BASIC METABOLIC PANEL  HEPATIC FUNCTION PANEL    Imaging Review Ct Renal Stone Study  02/13/2015   CLINICAL DATA:  Left flank pain for 2 or 3 weeks. Nausea and vomiting since yesterday with back pain. History of COPD and pancreatitis. Initial encounter.  EXAM: CT ABDOMEN AND PELVIS WITHOUT CONTRAST  TECHNIQUE: Multidetector CT imaging of the abdomen and pelvis was performed following the standard protocol without IV contrast.  COMPARISON:  CT 05/16/2011.  FINDINGS: Lower chest: Stable linear scarring or atelectasis in the right lower lobe. The left lung base is clear. No significant pleural or pericardial effusion. Small hiatal hernia.  Hepatobiliary: As imaged in the noncontrast state, the liver appears unremarkable. No evidence of gallstones, gallbladder wall thickening or biliary dilatation.  Pancreas: Unremarkable. No pancreatic ductal dilatation or surrounding inflammatory changes.  Spleen: Normal in size without focal abnormality.  Adrenals/Urinary Tract: Both adrenal glands appear normal.The kidneys appear normal without evidence of  urinary tract calculus, suspicious lesion or hydronephrosis. No bladder abnormalities are seen.  Stomach/Bowel: No evidence of bowel wall thickening, distention or surrounding inflammatory change.Diffuse diverticular changes of the sigmoid colon have progressed without surrounding inflammation. The appendix appears normal.  Vascular/Lymphatic: There are no enlarged abdominal or pelvic lymph nodes. Minimal aortoiliac atherosclerosis.  Reproductive: Previous partial hysterectomy. Probable residual ovarian tissue bilaterally, unchanged. No evidence of adnexal mass.  Other: Stable small umbilical hernia containing only fat.  Musculoskeletal: No acute or significant osseous findings.  IMPRESSION: 1. No acute findings. No evidence of urinary tract calculus or hydronephrosis. 2. Progressive sigmoid diverticulosis without evidence of acute inflammation. 3. Mild aortoiliac atherosclerosis.   Electronically Signed   By: Richardean Sale M.D.   On: 02/13/2015 13:31     EKG Interpretation None      MDM   Final diagnoses:  Kidney stone    54 year old female with 1 day of left-sided flank pain radiating to her left abdomen. Recent colonoscopy and upper  endoscopy. VS stable and patient well- appearing at presentation. Lab work shows normal creatinine, UA containing small amount of blood without any evidence of infection. Obtained CT to rule out postprocedural complication, evidence of diverticulitis, or obstructing kidney stone. CT was unremarkable. I discussed the patient's workup with her gastroenterologist, Dr. Oneida Alar, who agreed with the plan to treat supportively for suspected kidney stone given the patient's hematuria. Provided with Lortab and Zofran for symptom relief at home. Reviewed return precautions including worsening pain, fevers, or any other new or alarming symptoms. Patient voiced understanding. She will follow-up with PCP for urology referral.   Sharlett Iles, MD 02/13/15 1435  Called to  patient's room after discharge because patient complaining of rash. Patient with erythematous, raised skin eruption on the trunk and she complained of itching. She states that she has had Toradol and Zofran in the past and tolerated them without difficulty. It is unclear whether this is an allergic reaction but the patient denies any shortness of breath, throat swelling, or any change in her abdominal symptoms. Gave her 10 mg Decadron. She is allergic to Benadryl, so I have instructed her to take Zyrtec or Claritin for the next 4-5 days at home. I have emphasized the importance of seeking immediate medical attention if patient has any difficulty breathing, chest tightness, or any other new symptoms. Pt voiced understanding. Also discussed elevated BP with patient and she has been made aware of it at previous visits. Plans to f/u w/ PCP for BP management.  Sharlett Iles, MD 02/13/15 [phone]

## 2015-02-13 NOTE — ED Notes (Signed)
Patient transported to CT 

## 2015-02-13 NOTE — ED Notes (Signed)
Assumed care of patient from [place], [place]. Pt resting quietly at this time. No distress noted. VSS. Awaiting testing results and EDP eval.

## 2015-02-13 NOTE — Telephone Encounter (Signed)
Pt called complaining of left side and left back pain. She had colonoscopy done on Monday, August 1, and she was fine that day. Yesterday and last night she had the left side and left back pain and she said she rated it a 10 plus last night. Today she rates it at a 7.  She said her husband told her she was like that when she had kidney problems and she is wondering if maybe the prep caused this. I asked her did she drink a lot of water and she said that she did. I told her Dr. Oneida Alar is at the hospital, I'm sending her a message but if her pain worsens before she hears back from me to go to the ED. Please advise!

## 2015-02-13 NOTE — Telephone Encounter (Addendum)
PT WENT TO ED. SPOKE WITH DR. LITTLE. PT WILL HAVE CT OF THE ABD/PELVIS WO CONTRAST TO EVALUATE FOR KIDNEY STONE OR PERFORATION DUE TO CONTRAST ALLERGY.

## 2015-02-14 LAB — URINE CULTURE
Culture: NO GROWTH
SPECIAL REQUESTS: NORMAL

## 2015-02-15 ENCOUNTER — Telehealth: Payer: Self-pay | Admitting: Gastroenterology

## 2015-02-15 NOTE — Telephone Encounter (Signed)
ROUTING TO DORIS

## 2015-02-15 NOTE — Telephone Encounter (Signed)
PT IS AWARE OF RESULTS

## 2015-02-15 NOTE — Telephone Encounter (Signed)
Please call pt. HER stomach Bx shows mild gastritis. HER colon and small bowel biopsies are normal. HER BLOATING AND NAUSEA ARE MOST LIKELY DUE TO GASTRITIS/GERD.  DRINK WATER TO KEEP YOUR URINE LIGHT YELLOW.  FOLLOW A HIGH FIBER/LOW FAT DIET.   START OMEPRAZOLE.  TAKE 30 MINUTES PRIOR TO YOUR MEALS TWICE DAILY.  FOLLOW UP May 08, 2015 at 8:30AM.  Next colonoscopy in 10 years.

## 2015-05-08 ENCOUNTER — Ambulatory Visit (INDEPENDENT_AMBULATORY_CARE_PROVIDER_SITE_OTHER): Payer: 59 | Admitting: Nurse Practitioner

## 2015-05-08 ENCOUNTER — Encounter: Payer: Self-pay | Admitting: Nurse Practitioner

## 2015-05-08 ENCOUNTER — Encounter: Payer: Self-pay | Admitting: Gastroenterology

## 2015-05-08 VITALS — BP 159/112 | HR 69 | Temp 97.4°F | Ht 63.0 in | Wt 173.2 lb

## 2015-05-08 DIAGNOSIS — R030 Elevated blood-pressure reading, without diagnosis of hypertension: Secondary | ICD-10-CM | POA: Diagnosis not present

## 2015-05-08 DIAGNOSIS — R112 Nausea with vomiting, unspecified: Secondary | ICD-10-CM | POA: Diagnosis not present

## 2015-05-08 DIAGNOSIS — K921 Melena: Secondary | ICD-10-CM | POA: Diagnosis not present

## 2015-05-08 DIAGNOSIS — IMO0001 Reserved for inherently not codable concepts without codable children: Secondary | ICD-10-CM | POA: Insufficient documentation

## 2015-05-08 DIAGNOSIS — R140 Abdominal distension (gaseous): Secondary | ICD-10-CM

## 2015-05-08 NOTE — Patient Instructions (Signed)
1. Continue taking your acid blocker medicine. 2. Follow-up with your primary care provider ASAP, preferably today. Her blood pressure today was 159/112. 3. Return for follow-up in 6 months.

## 2015-05-08 NOTE — Assessment & Plan Note (Signed)
Patient's blood pressure was quite elevated today in the office at 159/112. She denies any neurological symptoms other than occasional dizziness. She states her blood pressure has been trending up since she stopped Neurontin about 1-2 months ago and subsequent weight gain. Before being asked she admitted she knows her blood pressure is high and she is going to her PCPs office today to have them start her on blood pressure medication to get under control until she can regain control of her diet and exercise regimen. Give appropriate ER precautions related to hypertension.

## 2015-05-08 NOTE — Assessment & Plan Note (Signed)
No recurrent hematochezia noted. Patient instructed that she does have moderate size hemorrhoids and if she has any recurring rectal bleeding to let us know we can send in rectal cream or suppository for symptomatic hemorrhoids. Return for follow-up in 6 months.

## 2015-05-08 NOTE — Progress Notes (Signed)
Referring Provider: Lucia Gaskins, MD Primary Care Physician:  Maricela Curet, MD Primary GI:  Dr. Oneida Alar  Chief Complaint  Patient presents with  . Follow-up    Doing much better    HPI:   54 year old female presents for follow-up on bloating, nausea and vomiting. Last office visit labs ordered including CBC, CMP, tissue transglutaminase IgA, total IgA, and fecal elastase, which were all normal. The patient underwent colonoscopy and upper endoscopy on 02/11/2015 with colonoscopy noted excellent prep, no obvious source for abdominal pain and loose stools, moderate diverticulosis in the sigmoid colon, rectal bleeding due to moderate sized internal hemorrhoids. Recommend high-fiber/low-fat diet, [place], follow-up office visit. Endoscopy found reflux esophagitis, patent peptic stricture, moderate size hiatal hernia, mild nonerosive gastritis. Recommend start omeprazole 30 minutes prior to meals twice a day.  Pathology of colon random biopsies were essentially normal, duodenum biopsy essentially normal, stomach biopsy with mild chronic inactive gastritis.  Today she states she's doing a lot better. Up until the past few days she's had essentially resolution of her symtpoms. However, in the past few days has had some mild abdominal pain, N/V. Overall, but her grandchild has been sick and thinks it's from that. Overall "I'm feeling pretty good." Her blood pressure is quite elevated today. She says it's been going up since she came off Neurontin and subsequent weight gain. She is making an appointment to see her PCP today to address it. Denies any further hematochezia. Denies melena. She is having some dizziness, denies syncope or near syncope. Denies severe headache, unilateral or bilateral weakness. Denies chest pain, dyspnea, dizziness, lightheadedness, syncope, near syncope. Denies any other upper or lower GI symptoms.  Past Medical History  Diagnosis Date  . Hypertension   .  Arthritis   . Asthma   . COPD (chronic obstructive pulmonary disease) ([place])   . Pancreatitis     as a child x 1 episode (per patient)  . Complication of anesthesia   . 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 Abdominal hysterectomy    . Tonsillectomy    . Ulner nerve surgery      Right Elbow  . Debridement leg      cellulitis debridement as a teen  . Colonoscopy N/A 02/11/2015    [id number] diverticulosis in the sigmoid colon/moderate internal hemorrhoids  . Esophagogastroduodenoscopy N/A 02/11/2015    [id number] size HH/mild non-erosive gastritis  . Esophageal dilation  02/11/2015    Procedure: ESOPHAGEAL DILATION;  Surgeon: Danie Binder, MD;  [facility]: [facility];  Service: Endoscopy;;    Current Outpatient Prescriptions  Medication Sig Dispense Refill  . ibuprofen (ADVIL,MOTRIN) 200 MG tablet Take 800 mg by mouth every 6 (six) hours as needed for headache or moderate pain.    Marland Kitchen omeprazole (PRILOSEC) 20 MG capsule 1 PO 30 mins prior to breakfast and supper (Patient taking differently: Take 20 mg by mouth daily. 1 PO 30 mins prior to breakfast and supper) 60 capsule 11  . HYDROcodone-acetaminophen (NORCO/VICODIN) 5-325 MG per tablet Take 1 tablet by mouth every 4 (four) hours as needed. (Patient not taking: Reported on 05/08/2015) 8 tablet 0  . ondansetron (ZOFRAN ODT) 4 MG disintegrating tablet Take 1 tablet (4 mg total) by mouth every 8 (eight) hours as needed for nausea or vomiting. (Patient not taking: Reported on 05/08/2015) 8 tablet 0   No current facility-administered medications for this visit.  Allergies as of 05/08/2015 - Review Complete 05/08/2015  Allergen Reaction Noted  . Benadryl [diphenhydramine hcl] Hives and Other (See Comments) 10/12/2011  . Erythromycin Hives 02/05/2015  . Iohexol  06/30/2007  . Penicillins Rash 02/13/2013    Family History  Problem Relation Age of Onset  .  Colon cancer Neg Hx     Social History   Social History  . Marital Status: Married    Spouse Name: N/A  . Number of Children: N/A  . Years of Education: N/A   Social History Main Topics  . Smoking status: Never Smoker   . Smokeless tobacco: Never Used     Comment: Never smoked  . Alcohol Use: No  . Drug Use: No  . Sexual Activity: Not Asked   Other Topics Concern  . None   Social History Narrative    Review of Systems: 10-point ROS negative except as noted in HPI.   Physical Exam: BP 159/112 mmHg  Pulse 69  Temp(Src) 97.4 F (36.3 C) (Oral)  Ht 5\' 3"  (1.6 m)  Wt 173 lb 3.2 oz (78.563 kg)  BMI 30.69 kg/m2 General:   Alert and oriented. Pleasant and cooperative. Well-nourished and well-developed.  Head:  Normocephalic and atraumatic. Cardiovascular:  S1, S2 present without murmurs appreciated. Extremities without clubbing or edema. Respiratory:  Clear to auscultation bilaterally. No wheezes, rales, or rhonchi. No distress.  Gastrointestinal:  +BS, soft, non-tender and non-distended. No HSM noted. No guarding or rebound. No masses appreciated.  Rectal:  Deferred  Neurologic:  Alert and oriented x4;  grossly normal neurologically. Psych:  Alert and cooperative. Normal mood and affect. Heme/Lymph/Immune: No excessive bruising noted.    05/08/2015 8:43 AM

## 2015-05-08 NOTE — Assessment & Plan Note (Addendum)
Symptoms essentially resolved. Couple days ago she had some mild recurrence of nausea however her granddaughter has recently had a GI virus and thinks this is related to that. Her for follow-up in 6 months.

## 2015-05-08 NOTE — Progress Notes (Signed)
cc'ed to pcp °

## 2015-05-08 NOTE — Assessment & Plan Note (Signed)
Symptoms resolved. Continue PPI twice daily. Return for follow-up in 6 months.

## 2015-11-04 ENCOUNTER — Ambulatory Visit (INDEPENDENT_AMBULATORY_CARE_PROVIDER_SITE_OTHER): Payer: 59 | Admitting: Gastroenterology

## 2015-11-04 ENCOUNTER — Encounter: Payer: Self-pay | Admitting: Gastroenterology

## 2015-11-04 VITALS — BP 145/99 | HR 70 | Temp 97.2°F | Ht 63.0 in | Wt 181.8 lb

## 2015-11-04 DIAGNOSIS — R110 Nausea: Secondary | ICD-10-CM | POA: Insufficient documentation

## 2015-11-04 NOTE — Patient Instructions (Signed)
We have scheduled you for a gastric emptying study.  Further recommendations to follow. You may need a breath test to evaluate for bacterial overgrowth.

## 2015-11-04 NOTE — Assessment & Plan Note (Signed)
EGD showing non-erosive gastritis. Continues Prilosec BID. Chronic, intermittent nausea. Proceed with GES. If this is negative, would pursue hydrogen breath test with take-home kit. As of note, declined any Bentyl for intermittent loose stool or Zofran for nausea today. Further recommendations to follow.

## 2015-11-04 NOTE — Progress Notes (Signed)
cc'ed to pcp °

## 2015-11-04 NOTE — Progress Notes (Signed)
Referring Provider: Lucia Gaskins, MD Primary Care Physician:  Maricela Curet, MD  Primary GI: Dr. Oneida Alar   Chief Complaint  Patient presents with  . Follow-up    HPI:   Vanessa Mcconnell is a 55 y.o. female presenting today with a history of chronic nausea, intermittent diarrhea. EGD/colonoscopy on file. Does not like taking medications and would rather not taking anything such as Zofran or Bentyl unless absolutely necessary. Extensive work-up to include celiac serologies, fecal elastase on file.   Chronic, intermittent nausea. Early satiety. Bloating.  Had diarrhea this past weekend and the one prior. Sometimes abdominal cramping. No vomiting. Prilosec BID. No longer on Zofran. Doesn't want to take Zofran or Bentyl.   Past Medical History  Diagnosis Date  . Hypertension   . Arthritis   . Asthma   . COPD (chronic obstructive pulmonary disease) ([place])   . Pancreatitis     as a child x 1 episode (per patient)  . Complication of anesthesia   . Malignant hyperthermia     AGE 79 DURING TONSILLECTOMY    Past Surgical History  Procedure Laterality Date  . Abdominal hysterectomy    . Tonsillectomy    . Ulner nerve surgery      Right Elbow  . Debridement leg      cellulitis debridement as a teen  . Colonoscopy N/A 02/11/2015    [id number] diverticulosis in the sigmoid colon/moderate internal hemorrhoids  . Esophagogastroduodenoscopy N/A 02/11/2015    [id number] size HH/mild non-erosive gastritis  . Esophageal dilation  02/11/2015    Procedure: ESOPHAGEAL DILATION;  Surgeon: Danie Binder, MD;  Location: [facility];  Service: Endoscopy;;    Current Outpatient Prescriptions  Medication Sig Dispense Refill  . amLODipine (NORVASC) 5 MG tablet     . dexamethasone (DECADRON) 4 MG tablet TAKE 1 TABLET BY MOUTH TWICE A DAY FOR CERVICAL RADICULOPATHY  0  . ibuprofen (ADVIL,MOTRIN) 200 MG tablet Take 800 mg by mouth every 6 (six) hours as needed for headache or moderate  pain.    Marland Kitchen losartan-hydrochlorothiazide (HYZAAR) 100-12.5 MG tablet     . omeprazole (PRILOSEC) 20 MG capsule 1 PO 30 mins prior to breakfast and supper (Patient taking differently: Take 20 mg by mouth daily. 1 PO 30 mins prior to breakfast and supper) 60 capsule 11  . HYDROcodone-acetaminophen (NORCO/VICODIN) 5-325 MG per tablet Take 1 tablet by mouth every 4 (four) hours as needed. (Patient not taking: Reported on 11/04/2015) 8 tablet 0  . ondansetron (ZOFRAN ODT) 4 MG disintegrating tablet Take 1 tablet (4 mg total) by mouth every 8 (eight) hours as needed for nausea or vomiting. (Patient not taking: Reported on 05/08/2015) 8 tablet 0   No current facility-administered medications for this visit.    Allergies as of 11/04/2015 - Review Complete 11/04/2015  Allergen Reaction Noted  . Benadryl [diphenhydramine hcl] Hives and Other (See Comments) 10/12/2011  . Erythromycin Hives 02/05/2015  . Iohexol  06/30/2007  . Penicillins Rash 02/13/2013    Family History  Problem Relation Age of Onset  . Colon cancer Neg Hx     Social History   Social History  . Marital Status: Married    Spouse Name: N/A  . Number of Children: N/A  . Years of Education: N/A   Social History Main Topics  . Smoking status: Never Smoker   . Smokeless tobacco: Never Used     Comment: Never smoked  . Alcohol Use: No  . Drug Use: No  .  Sexual Activity: Not Asked   Other Topics Concern  . None   Social History Narrative    Review of Systems: As mentioned in HPI.   Physical Exam: BP 145/99 mmHg  Pulse 70  Temp(Src) 97.2 F (36.2 C)  Ht 5\' 3"  (1.6 m)  Wt 181 lb 12.8 oz (82.464 kg)  BMI 32.21 kg/m2 General:   Alert and oriented. No distress noted. Pleasant and cooperative.  Head:  Normocephalic and atraumatic. Eyes:  Conjuctiva clear without scleral icterus. Mouth:  Oral mucosa pink and moist. Good dentition. No lesions. Abdomen:  +BS, soft, non-tender and non-distended. No rebound or guarding.  No HSM or masses noted. Msk:  Symmetrical without gross deformities. Normal posture. Extremities:  Without edema. Neurologic:  Alert and  oriented x4;  grossly normal neurologically. Psych:  Alert and cooperative. Normal mood and affect.

## 2015-11-05 ENCOUNTER — Encounter (HOSPITAL_COMMUNITY): Payer: Self-pay

## 2015-11-05 ENCOUNTER — Encounter (HOSPITAL_COMMUNITY)
Admission: RE | Admit: 2015-11-05 | Discharge: 2015-11-05 | Disposition: A | Discharge status: Home / Self Care | Payer: 59 | Source: Ambulatory Visit | Attending: Gastroenterology | Admitting: Gastroenterology

## 2015-11-05 ENCOUNTER — Telehealth: Payer: Self-pay | Admitting: Gastroenterology

## 2015-11-05 DIAGNOSIS — R140 Abdominal distension (gaseous): Secondary | ICD-10-CM | POA: Insufficient documentation

## 2015-11-05 DIAGNOSIS — R110 Nausea: Secondary | ICD-10-CM | POA: Diagnosis present

## 2015-11-05 DIAGNOSIS — R6881 Early satiety: Secondary | ICD-10-CM | POA: Diagnosis present

## 2015-11-05 MED ORDER — TECHNETIUM TC 99M SULFUR COLLOID
2.0000 | Freq: Once | INTRAVENOUS | Status: AC | PRN
  Administered 2015-11-05: 2 via ORAL

## 2015-11-05 NOTE — Telephone Encounter (Signed)
PATIENT NEEDS NOTE STATING SHE WAS HERE 11/04/15 FAXED TO HER WORK AT [phone]      ANY QUESTIONS CALL [phone]

## 2015-11-05 NOTE — Telephone Encounter (Signed)
Pt is aware I have faxed her note to her work.

## 2015-11-06 ENCOUNTER — Ambulatory Visit: Payer: 59 | Admitting: Nurse Practitioner

## 2015-11-12 NOTE — Progress Notes (Signed)
Quick Note:  Normal GES. We can pursue a hydrogen breath test, using the take home kit to wrap up the evaluation. ______

## 2015-11-14 NOTE — Progress Notes (Signed)
Quick Note:  Paperwork was left on my desk. Per Almyra Free, Ginger gave it to pt. ______

## 2015-12-12 DIAGNOSIS — K6389 Other specified diseases of intestine: Secondary | ICD-10-CM

## 2015-12-12 DIAGNOSIS — K638219 Small intestinal bacterial overgrowth, unspecified: Secondary | ICD-10-CM

## 2015-12-12 HISTORY — DX: Small intestinal bacterial overgrowth, unspecified: K63.8219

## 2015-12-12 HISTORY — DX: Other specified diseases of intestine: K63.89

## 2015-12-25 ENCOUNTER — Telehealth: Payer: Self-pay | Admitting: Gastroenterology

## 2015-12-25 NOTE — Telephone Encounter (Signed)
I called pt and she said her swallowing is getting worse. She can only get down a few bites at the time.  Said her throat also hurts when she swallows. OV with Laban Emperor, NP on 12/26/2015 at 10:30 AM.

## 2015-12-25 NOTE — Telephone Encounter (Signed)
Pt was seen in April for vomiting and called this afternoon saying that she is having problems with dysphagia for a couple of months now and didn't know if she needed to follow up with [place] or her PCP or if [facility] could call something in for her. Please advise [phone] or [phone]

## 2015-12-26 ENCOUNTER — Ambulatory Visit (INDEPENDENT_AMBULATORY_CARE_PROVIDER_SITE_OTHER): Payer: 59 | Admitting: Gastroenterology

## 2015-12-26 ENCOUNTER — Encounter: Payer: Self-pay | Admitting: Gastroenterology

## 2015-12-26 VITALS — BP 146/101 | HR 79 | Temp 97.8°F | Ht 65.0 in | Wt 178.4 lb

## 2015-12-26 DIAGNOSIS — K6389 Other specified diseases of intestine: Secondary | ICD-10-CM | POA: Diagnosis not present

## 2015-12-26 DIAGNOSIS — R1310 Dysphagia, unspecified: Secondary | ICD-10-CM | POA: Insufficient documentation

## 2015-12-26 DIAGNOSIS — K638219 Small intestinal bacterial overgrowth, unspecified: Secondary | ICD-10-CM | POA: Insufficient documentation

## 2015-12-26 LAB — TSH: TSH: 1.54 mIU/L

## 2015-12-26 MED ORDER — RIFAXIMIN 550 MG PO TABS
550.0000 mg | ORAL_TABLET | Freq: Three times a day (TID) | ORAL | Status: DC
Start: 2015-12-26 — End: 2017-06-23

## 2015-12-26 NOTE — Assessment & Plan Note (Signed)
New onset several months ago now with odynophagia. No evidence of oral thrush on exam. Proceed with BPE. EGD up-to-date and fairly recent as of Aug 2016.

## 2015-12-26 NOTE — Telephone Encounter (Signed)
SEE OPV JUN 15.

## 2015-12-26 NOTE — Assessment & Plan Note (Signed)
Recent breath test positive for SIBO. Will attempt Xifaxan TID for 2 weeks. Sent to pharmacy.

## 2015-12-26 NOTE — Progress Notes (Signed)
cc'ed to pcp °

## 2015-12-26 NOTE — Patient Instructions (Signed)
I have ordered the thyroid lab today.   I have also ordered a special test called a barium pill esophagram to see how you are swallowing and look at your esophagus.  You do have small intestinal bacterial overgrowth, and I have sent in Xifaxan to take three times a day for 2 weeks. Let me know if insurance does not cover or the copay is expensive.  Further recommendations after your test.

## 2015-12-26 NOTE — Progress Notes (Signed)
Referring Provider: Lucia Gaskins, MD Primary Care Physician:  Maricela Curet, MD  Primary GI: Dr. Oneida Alar   Chief Complaint  Patient presents with  . Dysphagia    HPI:   Vanessa Mcconnell is a 55 y.o. female presenting today with a history of chronic nausea and intermittent diarrhea. Extensive work-up thus far including celiac serologies, fecal elastase. Recent hydrogen breath test performed and positive for SIBO. TCS/EGD on file.   Last few months, will take a few bites and gets lodged in mid-esophagus. Notes odynophagia. Last EGD in Aug 2016. Symptoms getting worse. Thick globs of white clear stuff. Doesn't throw the food itself up all the time but most of the time thick, clear things. Has to sometimes pull out with her hands. Omeprazole BID.   Normal GES in April 2017. Marland Kitchen Rarely will have a pain in LUQ. At night has a lot of burping, which is painful.    Past Medical History  Diagnosis Date  . Hypertension   . Arthritis   . Asthma   . COPD (chronic obstructive pulmonary disease) ([place])   . Pancreatitis     as a child x 1 episode (per patient)  . Complication of anesthesia   . Malignant hyperthermia     AGE 68 DURING TONSILLECTOMY  . 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 Abdominal hysterectomy    . Tonsillectomy    . Ulner nerve surgery      Right Elbow  . Debridement leg      cellulitis debridement as a teen  . Colonoscopy N/A 02/11/2015    [id number] diverticulosis in the sigmoid colon/moderate internal hemorrhoids  . Esophagogastroduodenoscopy N/A 02/11/2015    [id number] size HH/mild non-erosive gastritis  . Esophageal dilation  02/11/2015    Procedure: ESOPHAGEAL DILATION;  Surgeon: Danie Binder, MD;  [facility];  Service: Endoscopy;;    Current Outpatient Prescriptions  Medication Sig Dispense Refill  . albuterol (PROVENTIL HFA;VENTOLIN HFA)  108 (90 Base) MCG/ACT inhaler Inhale into the lungs every 6 (six) hours as needed for wheezing or shortness of breath.    Marland Kitchen amLODipine (NORVASC) 5 MG tablet     . dexamethasone (DECADRON) 4 MG tablet TAKE 1 TABLET BY MOUTH TWICE A DAY FOR CERVICAL RADICULOPATHY  0  . ibuprofen (ADVIL,MOTRIN) 200 MG tablet Take 800 mg by mouth every 6 (six) hours as needed for headache or moderate pain.    Marland Kitchen losartan-hydrochlorothiazide (HYZAAR) 100-12.5 MG tablet     . omeprazole (PRILOSEC) 20 MG capsule 1 PO 30 mins prior to breakfast and supper (Patient taking differently: Take 20 mg by mouth daily. 1 PO 30 mins prior to breakfast and supper) 60 capsule 11  . rifaximin (XIFAXAN) 550 MG TABS tablet Take 1 tablet (550 mg total) by mouth 3 (three) times daily. 42 tablet 0   No current facility-administered medications for this visit.    Allergies as of 12/26/2015 - Review Complete 12/26/2015  Allergen Reaction Noted  . Benadryl [diphenhydramine hcl] Hives and Other (See Comments) 10/12/2011  . Erythromycin Hives 02/05/2015  . Iohexol  06/30/2007  . Penicillins Rash 02/13/2013    Family History  Problem Relation Age of Onset  . Colon cancer Neg Hx     Social History   Social History  . Marital Status: Married    Spouse Name: N/A  . Number of Children: N/A  .  Years of Education: N/A   Social History Main Topics  . Smoking status: Never Smoker   . Smokeless tobacco: Never Used     Comment: Never smoked  . Alcohol Use: No  . Drug Use: No  . Sexual Activity: Not Asked   Other Topics Concern  . None   Social History Narrative    Review of Systems: As mentioned in HPI   Physical Exam: BP 146/101 mmHg  Pulse 79  Temp(Src) 97.8 F (36.6 C) (Oral)  Ht 5\' 5"  (1.651 m)  Wt 178 lb 6.4 oz (80.922 kg)  BMI 29.69 kg/m2 General:   Alert and oriented. No distress noted. Pleasant and cooperative.  Head:  Normocephalic and atraumatic. Eyes:  Conjuctiva clear without scleral icterus. Mouth: no  evidence of oral thrush  Abdomen:  +BS, soft, non-tender and non-distended. No rebound or guarding. No HSM or masses noted. Msk:  Symmetrical without gross deformities. Normal posture. Extremities:  Without edema. Neurologic:  Alert and  oriented x4;  grossly normal neurologically. Psych:  Alert and cooperative. Normal mood and affect.

## 2015-12-26 NOTE — Progress Notes (Signed)
REVIEWED-NO ADDITIONAL RECOMMENDATIONS. 

## 2015-12-27 NOTE — Progress Notes (Signed)
Quick Note:    TSH: normal  ______

## 2015-12-27 NOTE — Progress Notes (Signed)
Quick Note:  Pt is aware. ______ 

## 2015-12-30 ENCOUNTER — Ambulatory Visit (HOSPITAL_COMMUNITY)
Admission: RE | Admit: 2015-12-30 | Discharge: 2015-12-30 | Disposition: A | Discharge status: Home / Self Care | Payer: 59 | Source: Ambulatory Visit | Attending: Gastroenterology | Admitting: Gastroenterology

## 2015-12-30 DIAGNOSIS — K219 Gastro-esophageal reflux disease without esophagitis: Secondary | ICD-10-CM | POA: Diagnosis not present

## 2015-12-30 DIAGNOSIS — K449 Diaphragmatic hernia without obstruction or gangrene: Secondary | ICD-10-CM | POA: Insufficient documentation

## 2015-12-30 DIAGNOSIS — R1310 Dysphagia, unspecified: Secondary | ICD-10-CM | POA: Diagnosis present

## 2016-01-08 ENCOUNTER — Telehealth: Payer: Self-pay

## 2016-01-08 NOTE — Progress Notes (Signed)
Quick Note:  BPE reviewed. No stricture or evidence of esophagitis. She has a small hiatal hernia and reflux. If she is taking omeprazole once daily, would she be willing to trial Dexilant samples? Her symptoms could be refractory GERD related. ______

## 2016-01-08 NOTE — Progress Notes (Signed)
Quick Note:  Pt is aware of BPE results and recommendations. She has been taking the Omeprazole bid, but she is willing to try the [place] if you would like. ______

## 2016-01-08 NOTE — Telephone Encounter (Signed)
Pt is calling to see if the results are back from her test. Pleaes advise

## 2016-01-08 NOTE — Progress Notes (Signed)
Quick Note:  Yes, let's trial Dexilant samples. If it works, I will send in. ______

## 2016-01-08 NOTE — Progress Notes (Signed)
Quick Note:  Pt is aware and will come by to pick up samples. ______

## 2016-01-08 NOTE — Telephone Encounter (Signed)
Please see result note, and let her know I was on vacation last week, which is why there is a lag.

## 2016-01-08 NOTE — Progress Notes (Signed)
Quick Note:  Tried to call. Pt is at lunch. Samples of Dexilant 60 mg #15 at front for pt to try. ______

## 2016-01-13 ENCOUNTER — Encounter: Payer: Self-pay | Admitting: Gastroenterology

## 2016-08-19 IMAGING — CT CT RENAL STONE PROTOCOL
2 of 4 series · 15 of 46 positions shown, 17 images · non-contrast
Comparison: CT 05/16/2011.

CLINICAL DATA: Left flank pain for 2 or 3 weeks. Nausea and
vomiting since yesterday with back pain. History of COPD and
pancreatitis. Initial encounter.

EXAM:
CT ABDOMEN AND PELVIS WITHOUT CONTRAST
TECHNIQUE: Multidetector CT imaging of the abdomen and pelvis was performed
following the standard protocol without IV contrast.

[Series 2: standard/full over (age)lbs 5.0 · axial · 0.62mm/px · z∈[+534,+934]mm · 12 of 88 slices shown, 14 images]
[im 4/88  soft-tissue]
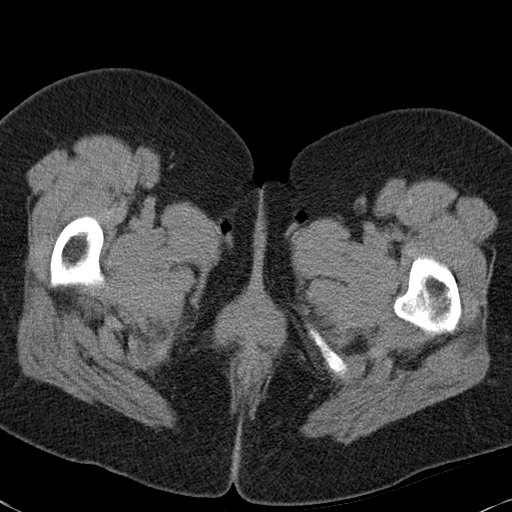
[im 4/88  bone]
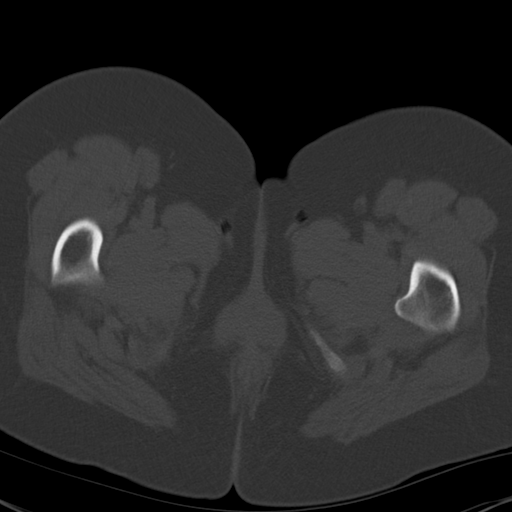
[im 11/88  soft-tissue]
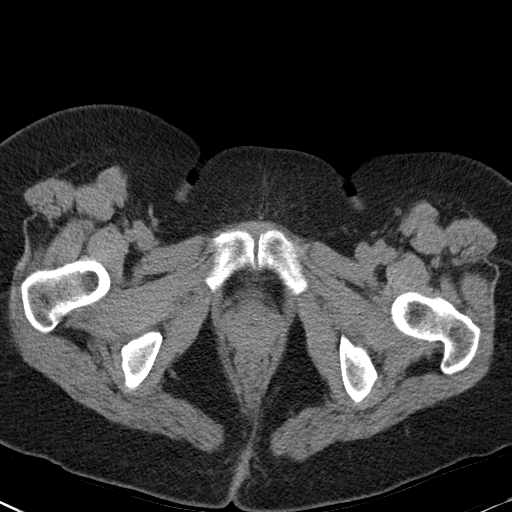
[im 19/88  soft-tissue]
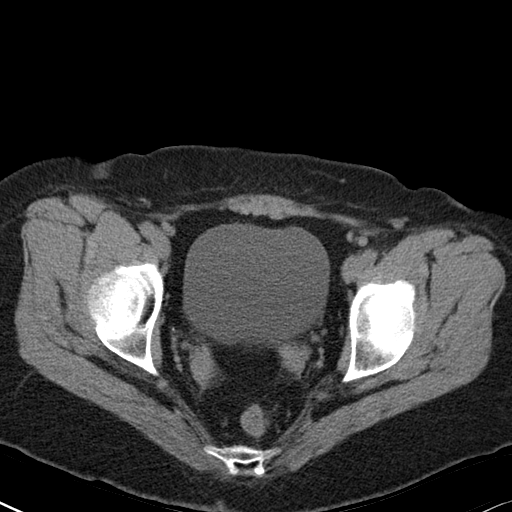
[im 26/88  soft-tissue]
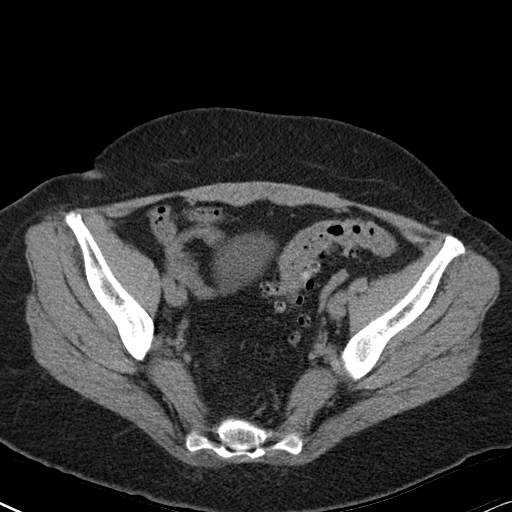
[im 33/88  soft-tissue]
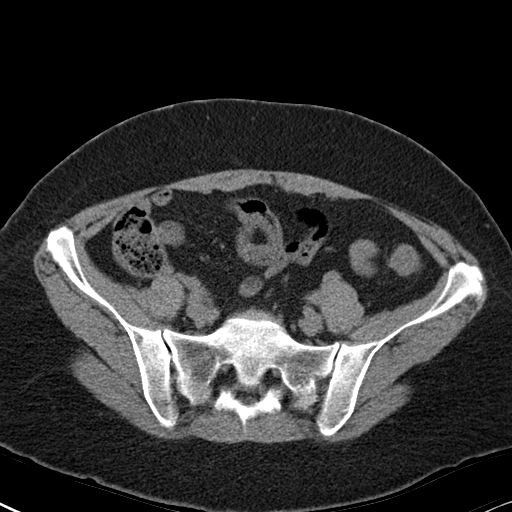
[im 40/88  soft-tissue]
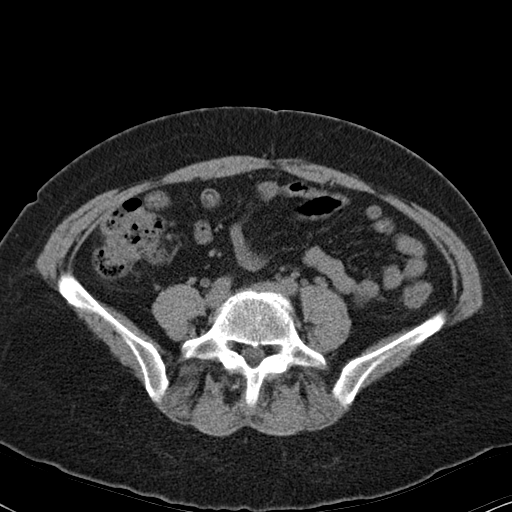
[im 48/88  soft-tissue]
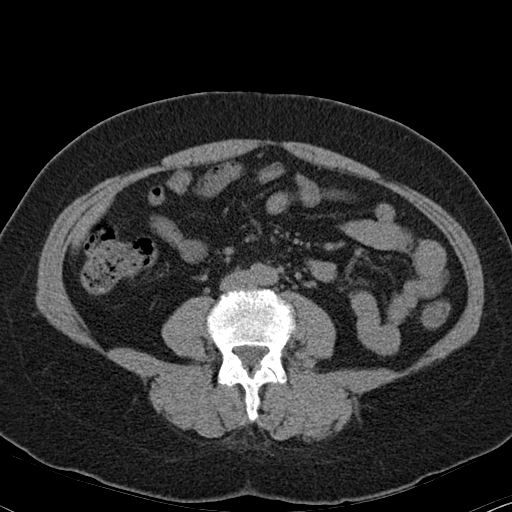
[im 55/88  soft-tissue]
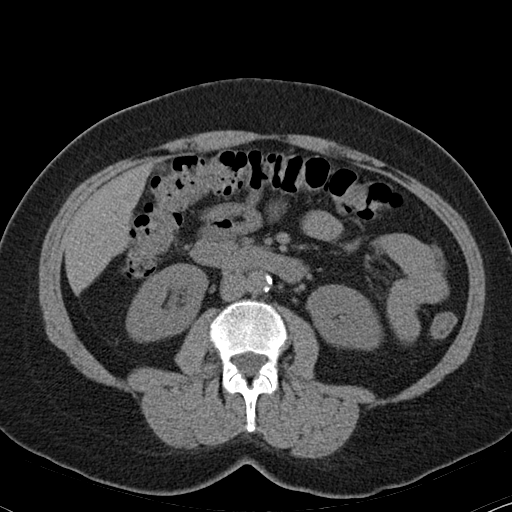
[im 62/88  soft-tissue]
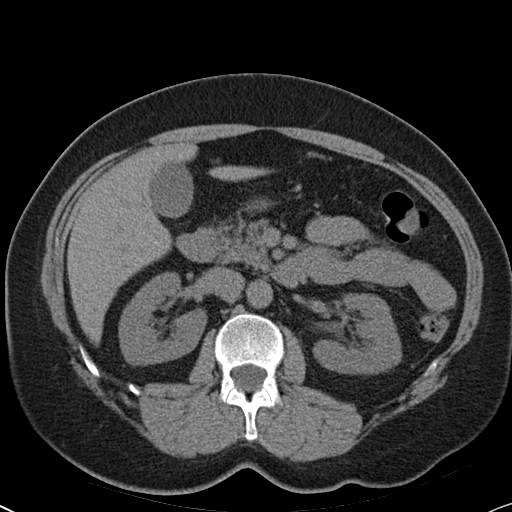
[im 62/88  bone]
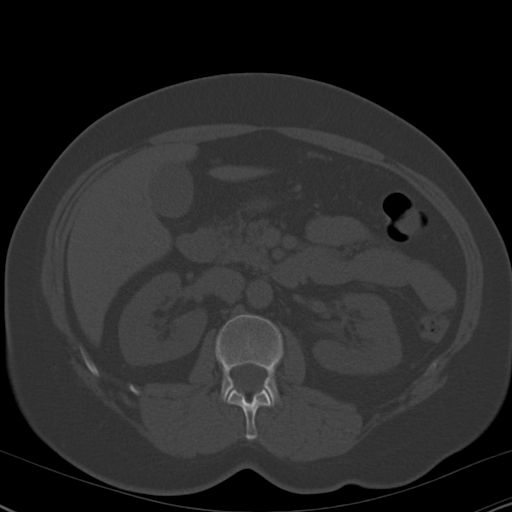
[im 69/88  soft-tissue]
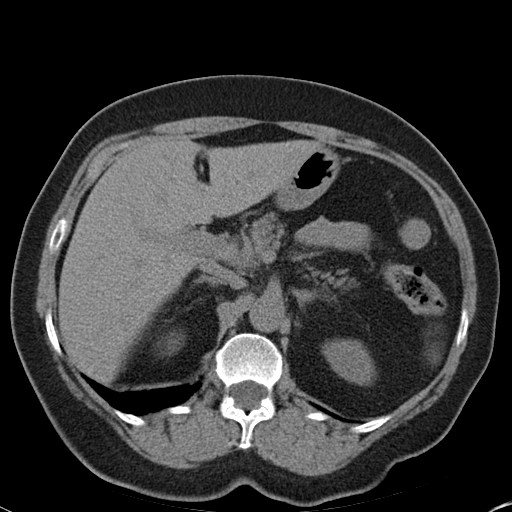
[im 77/88  soft-tissue]
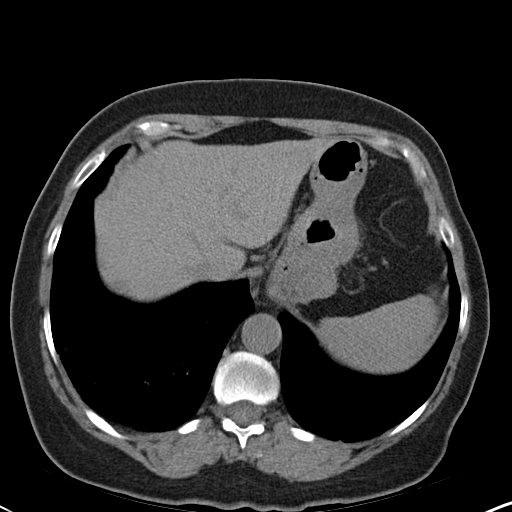
[im 84/88  soft-tissue]
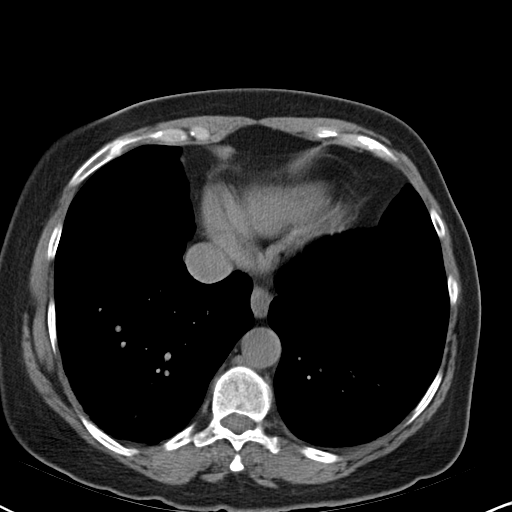

[Series 4: mpr coronal · coronal · 0.63mm/px · 3 of 92 slices shown]
[im 31/92  soft-tissue]
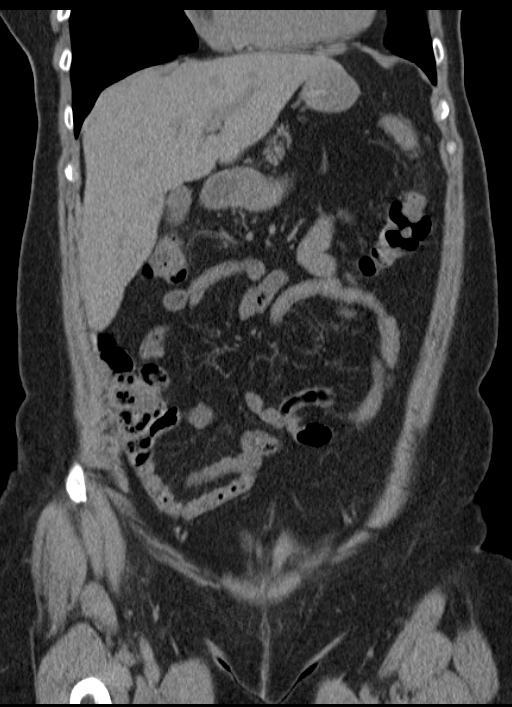
[im 41/92  soft-tissue]
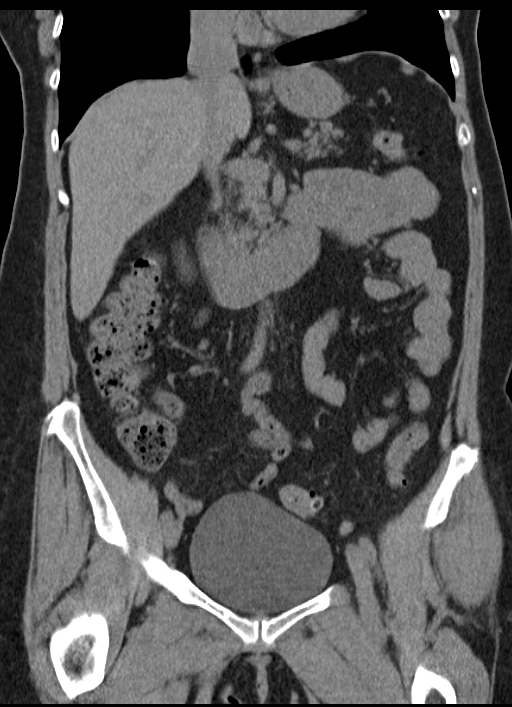
[im 51/92  soft-tissue]
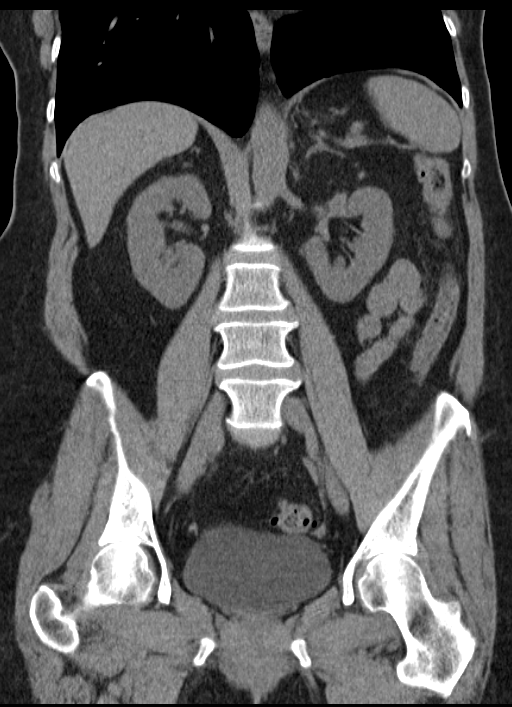

[15 of 46 positions shown; findings below may reference images not displayed]

FINDINGS: Lower chest: Stable linear scarring or atelectasis in the right
lower lobe. The left lung base is clear. No significant pleural or
pericardial effusion. Small hiatal hernia.

Hepatobiliary: As imaged in the noncontrast state, the liver appears
unremarkable. No evidence of gallstones, gallbladder wall thickening
or biliary dilatation.

Pancreas: Unremarkable. No pancreatic ductal dilatation or
surrounding inflammatory changes.

Spleen: Normal in size without focal abnormality.

Adrenals/Urinary Tract: Both adrenal glands appear normal.The
kidneys appear normal without evidence of urinary tract calculus,
suspicious lesion or hydronephrosis. No bladder abnormalities are
seen.

Stomach/Bowel: No evidence of bowel wall thickening, distention or
surrounding inflammatory change.Diffuse diverticular changes of the
sigmoid colon have progressed without surrounding inflammation. The
appendix appears normal.

Vascular/Lymphatic: There are no enlarged abdominal or pelvic lymph
nodes. Minimal aortoiliac atherosclerosis.

Reproductive: Previous partial hysterectomy. Probable residual
ovarian tissue bilaterally, unchanged. No evidence of adnexal mass.

Other: Stable small umbilical hernia containing only fat.

Musculoskeletal: No acute or significant osseous findings.
IMPRESSION: 1. No acute findings. No evidence of urinary tract calculus or
hydronephrosis.
2. Progressive sigmoid diverticulosis without evidence of acute
inflammation.
3. Mild aortoiliac atherosclerosis.

## 2017-06-23 ENCOUNTER — Encounter: Payer: Self-pay | Admitting: *Deleted

## 2017-06-23 ENCOUNTER — Ambulatory Visit (INDEPENDENT_AMBULATORY_CARE_PROVIDER_SITE_OTHER): Payer: BLUE CROSS/BLUE SHIELD | Admitting: Cardiology

## 2017-06-23 VITALS — BP 144/88 | HR 76 | Ht 63.0 in | Wt 176.4 lb

## 2017-06-23 DIAGNOSIS — M79604 Pain in right leg: Secondary | ICD-10-CM | POA: Diagnosis not present

## 2017-06-23 DIAGNOSIS — M79605 Pain in left leg: Secondary | ICD-10-CM

## 2017-06-23 DIAGNOSIS — Z8639 Personal history of other endocrine, nutritional and metabolic disease: Secondary | ICD-10-CM | POA: Diagnosis not present

## 2017-06-23 DIAGNOSIS — I10 Essential (primary) hypertension: Secondary | ICD-10-CM

## 2017-06-23 DIAGNOSIS — R0789 Other chest pain: Secondary | ICD-10-CM | POA: Diagnosis not present

## 2017-06-23 NOTE — Patient Instructions (Signed)
Medication Instructions:  Your physician recommends that you continue on your current medications as directed. Please refer to the Current Medication list given to you today.  Labwork: NONE  Testing/Procedures: Your physician has requested that you have an ankle brachial index (ABI). During this test an ultrasound and blood pressure cuff are used to evaluate the arteries that supply the arms and legs with blood. Allow thirty minutes for this exam. There are no restrictions or special instructions.  Your physician has requested that you have a lower or upper extremity arterial duplex. This test is an ultrasound of the arteries in the legs or arms. It looks at arterial blood flow in the legs and arms. Allow one hour for Lower and Upper Arterial scans. There are no restrictions or special instructions  Your physician has requested that you have a lexiscan myoview. For further information please visit [email or web address]. Please follow instruction sheet, as given.    Follow-Up: Your physician recommends that you schedule a follow-up appointment PENDING TEST RESULTS  Any Other Special Instructions Will Be Listed Below (If Applicable).  If you need a refill on your cardiac medications before your next appointment, please call your pharmacy.

## 2017-06-23 NOTE — Progress Notes (Signed)
Cardiology Office Note  Date: 06/23/2017   ID: [name], DOB [birth year], MRN [id number]  PCP: Vanessa Chroman, MD  Consulting Cardiologist: Vanessa Lesches, MD   Chief Complaint  Patient presents with  . Chest Pain  . Shortness of Breath  . Leg Pain    History of Present Illness: Vanessa Mcconnell is a 56 y.o. female referred for cardiology consultation by Dr. Woody Mcconnell for evaluation of chest pain.  She presents today with a number of complaints.  I reviewed the available records and updated the chart.  She first mentioned that she has a long-standing history of recurring left leg pain, points to her posterior lower left leg and also up to her thigh.  She states that this has been a recurrent problem for greater than a year.  She denies having any previous imaging studies but states that she has been told that this is related to "poor circulation."  She also states that she has had a recurring chest discomfort for at least a year.  She describes a sharp discomfort in her chest, also a pulling sensation, does not necessarily correlate with any particular level of activity.  She denies any sense of palpitations with this discomfort, but states that she was seen by cardiology years ago for follow-up of mitral valve prolapse.  She also reports shortness of breath with activity, recent fatigue.  She reports that her blood pressure has been fluctuating, not optimally controlled.  Recent medications include losartan and Toprol-XL (new), she was taken off Norvasc.  Records indicate that the patient was hospitalized at [facility] in November with hypertensive urgency as well as chest discomfort.  Troponin T levels were negative for ACS.  She was managed with losartan and Toprol-XL and had good control of blood pressure by discharge.  Echocardiogram obtained on November 21 reported LVEF 55-60%, normal right ventricular contraction, mild mitral valve prolapse with mild mitral  regurgitation, and trace tricuspid regurgitation.  She does not recall undergoing any previous ischemic cardiac testing.  She states that she has been compliant with her medications.  She also states that she is currently out of work pending further evaluation as recommended by her PCP.  Past Medical History:  Diagnosis Date  . Arthritis   . Essential hypertension   . History of migraine   . Hyperlipidemia   . 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 Pancreatitis    Reportedly single episode in childhood  . Small intestinal bacterial overgrowth 12/2015   Positive breath test  . Thoracic disc herniation     Past Surgical History:  Procedure Laterality Date  . ABDOMINAL HYSTERECTOMY    . COLONOSCOPY N/A 02/11/2015   MGN:[id number] diverticulosis in the sigmoid colon/moderate internal hemorrhoids  . DEBRIDEMENT LEG     cellulitis debridement as a teen  . ESOPHAGEAL DILATION  02/11/2015   Procedure: ESOPHAGEAL DILATION;  Surgeon: Danie Binder, MD;  [facility];  Service: Endoscopy;;  . ESOPHAGOGASTRODUODENOSCOPY N/A 02/11/2015   [id number] size HH/mild non-erosive gastritis  . TONSILLECTOMY    . ULNER NERVE SURGERY     Right Elbow    Current Outpatient Medications  Medication Sig Dispense Refill  . acetaminophen (TYLENOL) 500 MG tablet Take 500 mg by mouth every 6 (six) hours as needed.    Marland Kitchen albuterol (PROVENTIL HFA;VENTOLIN HFA) 108 (90 Base) MCG/ACT inhaler Inhale into the lungs every 6 (six) hours as needed for wheezing or  shortness of breath.    . losartan-hydrochlorothiazide (HYZAAR) 100-12.5 MG tablet Take 1 tablet by mouth daily.     . metoprolol succinate (TOPROL-XL) 25 MG 24 hr tablet Take 25 mg by mouth 2 (two) times daily.  5  . predniSONE (STERAPRED UNI-PAK 48 TAB) 5 MG (48) TBPK tablet Take 5 mg by mouth daily.    Marland Kitchen sulfamethoxazole-trimethoprim (BACTRIM) 400-80 MG tablet Take 1 tablet by mouth 2 (two) times daily.     No  current facility-administered medications for this visit.    Allergies:  Benadryl [diphenhydramine hcl]; Erythromycin; Iohexol; Penicillins; and Tetracyclines & related   Social History: The patient  reports that  has never smoked. she has never used smokeless tobacco. She reports that she does not drink alcohol or use drugs.   Family History: The patient's family history includes CAD in her sister; Congestive Heart Failure in her sister; Diabetes in her father; Epilepsy in her sister; Heart attack in her father and maternal grandfather; Heart disease in her son and son; Hypertension in her father and mother; Other in her sister; Peripheral Artery Disease in her sister; Supraventricular tachycardia in her sister.   ROS:  Please see the history of present illness. Otherwise, complete review of systems is positive for intermittent dizziness, no syncope.  All other systems are reviewed and negative.   Physical Exam: VS:  BP (!) 144/88 (BP Location: Right Arm, Cuff Size: Large)   Pulse 76   Ht 5\' 3"  (1.6 m)   Wt 176 lb 6.4 oz (80 kg)   SpO2 98%   BMI 31.25 kg/m , BMI Body mass index is 31.25 kg/m.  Wt Readings from Last 3 Encounters:  06/23/17 176 lb 6.4 oz (80 kg)  12/26/15 178 lb 6.4 oz (80.9 kg)  11/04/15 181 lb 12.8 oz (82.5 kg)    General: Overweight woman, appears comfortable at rest. HEENT: Conjunctiva and lids normal, oropharynx clear. Neck: Supple, no elevated JVP or carotid bruits, no thyromegaly. Lungs: Clear to auscultation, nonlabored breathing at rest. Cardiac: Regular rate and rhythm, no S3, soft systolic murmur, no pericardial rub. Abdomen: Soft, nontender, bowel sounds present, no guarding or rebound. Extremities: No pitting edema, distal pulses 1-2+. Skin: Warm and dry. Musculoskeletal: No kyphosis. Neuropsychiatric: Alert and oriented x3, affect grossly appropriate.  ECG: I personally reviewed the tracing from 02/25/2013 which showed normal sinus rhythm.  Recent  Labwork:  November 2018: BUN 7, creatinine 0.59, AST 19, ALT 18, potassium 3.3, troponin T negative, NT-pro BNP 65, hemoglobin 15.6, platelets 309  Other Studies Reviewed Today:  Echocardiogram 06/02/2017 [facility]): Normal LV wall thickness with LVEF 62-13%, normal diastolic function, normal right ventricular contraction, mild mitral valve prolapse with mild mitral regurgitation, trace tricuspid regurgitation, no pericardial effusion.  Assessment and Plan:  1.  Intermittent chest discomfort, atypical in description.  States that this is been occurring for quite some time, but recently feeling more of a pulling sensation.  No definite precipitant by exertion but she does indicate intermittent dyspnea on exertion and fatigue.  Recent workup at [facility] showed normal troponin T levels and echocardiogram demonstrating normal LVEF as well as diastolic function.  Cardiac risk factors include hypertension and family history of CAD.  We will proceed with ischemic testing via [place], [place].  2.  Essential hypertension, reportedly fluctuating blood pressures recently.  She reports compliance with her medications including losartan and Toprol-XL.  Recommend continued follow-up with PCP for further adjustments.  Blood pressure today  is within reasonable range.  Weight loss and low-sodium diet would also be beneficial.  Could consider further up titrating Toprol-XL, and perhaps even re-addition of lower dose Norvasc if needed.  3.  Leg pain, left worse than right.  This also has been a fairly long-standing, recurrent problem.  She denies having any previous imaging studies but is under the impression that this is related to "poor circulation."  Plan is to obtain lower extremity arterial Dopplers and ABIs for baseline.  4.  Possible history of hyperlipidemia based on chart review.  Currently not on specific medical therapy.  Keep follow-up with PCP.  Current medicines  were reviewed with the patient today.   Orders Placed This Encounter  Procedures  . NM Myocar Multi W/Spect W/Wall Motion / EF    Disposition: Call with test results.  Signed, Satira Sark, MD, [facility] 06/23/2017 3:59 PM    [facility] [place], [place], [place] [address] Phone: [phone]; Fax: [phone]

## 2017-06-29 ENCOUNTER — Ambulatory Visit (INDEPENDENT_AMBULATORY_CARE_PROVIDER_SITE_OTHER): Payer: BLUE CROSS/BLUE SHIELD

## 2017-06-29 ENCOUNTER — Other Ambulatory Visit: Payer: Self-pay | Admitting: Cardiology

## 2017-06-29 DIAGNOSIS — I739 Peripheral vascular disease, unspecified: Secondary | ICD-10-CM

## 2017-06-29 DIAGNOSIS — M79604 Pain in right leg: Secondary | ICD-10-CM

## 2017-06-29 DIAGNOSIS — M79605 Pain in left leg: Principal | ICD-10-CM

## 2017-06-30 ENCOUNTER — Encounter (HOSPITAL_COMMUNITY)
Admission: RE | Admit: 2017-06-30 | Discharge: 2017-06-30 | Disposition: A | Discharge status: Home / Self Care | Payer: BLUE CROSS/BLUE SHIELD | Source: Ambulatory Visit | Attending: Cardiology | Admitting: Cardiology

## 2017-06-30 ENCOUNTER — Encounter (HOSPITAL_COMMUNITY): Payer: Self-pay

## 2017-06-30 ENCOUNTER — Encounter (HOSPITAL_BASED_OUTPATIENT_CLINIC_OR_DEPARTMENT_OTHER)
Admission: RE | Admit: 2017-06-30 | Discharge: 2017-06-30 | Disposition: A | Discharge status: Home / Self Care | Payer: BLUE CROSS/BLUE SHIELD | Source: Ambulatory Visit | Attending: Cardiology | Admitting: Cardiology

## 2017-06-30 DIAGNOSIS — R0789 Other chest pain: Secondary | ICD-10-CM | POA: Diagnosis not present

## 2017-06-30 LAB — NM MYOCAR MULTI W/SPECT W/WALL MOTION / EF
LV dias vol: 71 mL (ref 46–106)
LV sys vol: 28 mL
Peak HR: 126 {beats}/min
RATE: 0.38
Rest HR: 57 {beats}/min
SDS: 1
SRS: 0
SSS: 1
TID: 1.06

## 2017-06-30 MED ORDER — SODIUM CHLORIDE 0.9% FLUSH
INTRAVENOUS | Status: AC
  Administered 2017-06-30: 10 mL via INTRAVENOUS
  Filled 2017-06-30: qty 10

## 2017-06-30 MED ORDER — REGADENOSON 0.4 MG/5ML IV SOLN
INTRAVENOUS | Status: AC
  Administered 2017-06-30: 0.4 mg via INTRAVENOUS
  Filled 2017-06-30: qty 5

## 2017-06-30 MED ORDER — TECHNETIUM TC 99M TETROFOSMIN IV KIT
30.0000 | PACK | Freq: Once | INTRAVENOUS | Status: AC | PRN
  Administered 2017-06-30: 30 via INTRAVENOUS

## 2017-06-30 MED ORDER — TECHNETIUM TC 99M TETROFOSMIN IV KIT
10.0000 | PACK | Freq: Once | INTRAVENOUS | Status: AC | PRN
  Administered 2017-06-30: 10 via INTRAVENOUS

## 2017-07-01 ENCOUNTER — Telehealth: Payer: Self-pay

## 2017-07-01 NOTE — Telephone Encounter (Signed)
-----   Message from Merlene Laughter, LPN sent at [phone]/37/1696  7:20 AM EST -----   ----- Message ----- From: Satira Sark, MD Sent: 06/30/2017   2:35 PM To: Merlene Laughter, LPN  Results reviewed. Lower extremity arterial studies are normal arguing against PAD as cause of her leg pain. A copy of this test should be forwarded to Glenda Chroman, MD.

## 2017-07-01 NOTE — Telephone Encounter (Signed)
Patient notified. Routed to PCP 

## 2017-07-01 NOTE — Telephone Encounter (Signed)
-----   Message from Merlene Laughter, LPN sent at [id number]/61/[id number]  7:20 AM EST -----   ----- Message ----- From: Satira Sark, MD Sent: 06/30/2017   3:06 PM To: Merlene Laughter, LPN  Results reviewed. Please let her know that the stress test was low risk without large ischemic territory to suggest major obstructive CAD. LVEF normal. A copy of this test should be forwarded to Glenda Chroman, MD.

## 2017-08-26 ENCOUNTER — Other Ambulatory Visit: Payer: Self-pay

## 2017-08-26 ENCOUNTER — Observation Stay (HOSPITAL_COMMUNITY)
Admission: EM | Admit: 2017-08-26 | Discharge: 2017-08-27 | Disposition: A | Discharge status: Home / Self Care | Payer: BLUE CROSS/BLUE SHIELD | Attending: Internal Medicine | Admitting: Internal Medicine

## 2017-08-26 ENCOUNTER — Emergency Department (HOSPITAL_COMMUNITY): Payer: BLUE CROSS/BLUE SHIELD

## 2017-08-26 ENCOUNTER — Encounter (HOSPITAL_COMMUNITY): Payer: Self-pay | Admitting: Emergency Medicine

## 2017-08-26 DIAGNOSIS — R112 Nausea with vomiting, unspecified: Secondary | ICD-10-CM | POA: Diagnosis not present

## 2017-08-26 DIAGNOSIS — I10 Essential (primary) hypertension: Secondary | ICD-10-CM | POA: Insufficient documentation

## 2017-08-26 DIAGNOSIS — Z79899 Other long term (current) drug therapy: Secondary | ICD-10-CM | POA: Insufficient documentation

## 2017-08-26 DIAGNOSIS — E876 Hypokalemia: Secondary | ICD-10-CM | POA: Insufficient documentation

## 2017-08-26 DIAGNOSIS — J45909 Unspecified asthma, uncomplicated: Secondary | ICD-10-CM | POA: Diagnosis not present

## 2017-08-26 DIAGNOSIS — R079 Chest pain, unspecified: Secondary | ICD-10-CM | POA: Diagnosis not present

## 2017-08-26 DIAGNOSIS — Z91040 Latex allergy status: Secondary | ICD-10-CM | POA: Insufficient documentation

## 2017-08-26 DIAGNOSIS — E785 Hyperlipidemia, unspecified: Secondary | ICD-10-CM | POA: Diagnosis not present

## 2017-08-26 LAB — CBC WITH DIFFERENTIAL/PLATELET
BASOS ABS: 0 10*3/uL (ref 0.0–0.1)
BASOS PCT: 0 %
Basophils Absolute: 0 10*3/uL (ref 0.0–0.1)
Basophils Relative: 0 %
EOS ABS: 0.1 10*3/uL (ref 0.0–0.7)
EOS PCT: 1 %
Eosinophils Absolute: 0.1 10*3/uL (ref 0.0–0.7)
Eosinophils Relative: 1 %
HCT: 45.6 % (ref 36.0–46.0)
HEMATOCRIT: 45.2 % (ref 36.0–46.0)
HEMOGLOBIN: 15.1 g/dL — AB (ref 12.0–15.0)
Hemoglobin: 14.9 g/dL (ref 12.0–15.0)
Lymphocytes Relative: 23 %
Lymphocytes Relative: 18 %
Lymphs Abs: 1.8 10*3/uL (ref 0.7–4.0)
Lymphs Abs: 2.1 10*3/uL (ref 0.7–4.0)
MCH: 30 pg (ref 26.0–34.0)
MCH: 30.1 pg (ref 26.0–34.0)
MCHC: 33 g/dL (ref 30.0–36.0)
MCHC: 33.1 g/dL (ref 30.0–36.0)
MCV: 90.9 fL (ref 78.0–100.0)
MCV: 90.8 fL (ref 78.0–100.0)
MONOS PCT: 5 %
Monocytes Absolute: 0.4 10*3/uL (ref 0.1–1.0)
Monocytes Absolute: 0.7 10*3/uL (ref 0.1–1.0)
Monocytes Relative: 6 %
NEUTROS PCT: 75 %
Neutro Abs: 5.5 10*3/uL (ref 1.7–7.7)
Neutro Abs: 8.7 10*3/uL — ABNORMAL HIGH (ref 1.7–7.7)
Neutrophils Relative %: 71 %
PLATELETS: 350 10*3/uL (ref 150–400)
Platelets: 343 10*3/uL (ref 150–400)
RBC: 4.97 MIL/uL (ref 3.87–5.11)
RBC: 5.02 MIL/uL (ref 3.87–5.11)
RDW: 14 % (ref 11.5–15.5)
RDW: 13.9 % (ref 11.5–15.5)
WBC: 7.9 10*3/uL (ref 4.0–10.5)
WBC: 11.7 10*3/uL — AB (ref 4.0–10.5)

## 2017-08-26 LAB — COMPREHENSIVE METABOLIC PANEL
ALBUMIN: 4.3 g/dL (ref 3.5–5.0)
ALT: 17 U/L (ref 14–54)
AST: 16 U/L (ref 15–41)
Alkaline Phosphatase: 125 U/L (ref 38–126)
Anion gap: 12 (ref 5–15)
BILIRUBIN TOTAL: 0.7 mg/dL (ref 0.3–1.2)
BUN: 16 mg/dL (ref 6–20)
CO2: 25 mmol/L (ref 22–32)
Calcium: 9.6 mg/dL (ref 8.9–10.3)
Chloride: 103 mmol/L (ref 101–111)
Creatinine, Ser: 0.75 mg/dL (ref 0.44–1.00)
GFR calc Af Amer: >60 mL/min (ref >60)
GFR calc non Af Amer: >60 mL/min (ref >60)
GLUCOSE: 99 mg/dL (ref 65–99)
POTASSIUM: 3.3 mmol/L — AB (ref 3.5–5.1)
SODIUM: 140 mmol/L (ref 135–145)
TOTAL PROTEIN: 7.9 g/dL (ref 6.5–8.1)

## 2017-08-26 LAB — TROPONIN I
Troponin I: <0.03 ng/mL (ref <0.03)
Troponin I: <0.03 ng/mL (ref <0.03)

## 2017-08-26 LAB — MAGNESIUM: MAGNESIUM: 2.1 mg/dL (ref 1.7–2.4)

## 2017-08-26 MED ORDER — ONDANSETRON 4 MG PO TBDP
ORAL_TABLET | ORAL | Status: AC
  Filled 2017-08-26: qty 1

## 2017-08-26 MED ORDER — ONDANSETRON 4 MG PO TBDP
4.0000 mg | ORAL_TABLET | Freq: Once | ORAL | Status: AC | PRN
  Administered 2017-08-26: 4 mg via ORAL

## 2017-08-26 MED ORDER — AMLODIPINE BESYLATE 5 MG PO TABS
5.0000 mg | ORAL_TABLET | Freq: Every evening | ORAL | Status: DC
  Administered 2017-08-26: 5 mg via ORAL
  Filled 2017-08-26: qty 1

## 2017-08-26 MED ORDER — LOSARTAN POTASSIUM 50 MG PO TABS
100.0000 mg | ORAL_TABLET | Freq: Every day | ORAL | Status: DC
  Filled 2017-08-26: qty 2

## 2017-08-26 MED ORDER — ONDANSETRON HCL 4 MG PO TABS: 4.0000 mg | ORAL_TABLET | Freq: Four times a day (QID) | ORAL | Status: DC | PRN

## 2017-08-26 MED ORDER — ALBUTEROL SULFATE (2.5 MG/3ML) 0.083% IN NEBU: 3.0000 mL | INHALATION_SOLUTION | Freq: Four times a day (QID) | RESPIRATORY_TRACT | Status: DC | PRN

## 2017-08-26 MED ORDER — ACETAMINOPHEN 325 MG PO TABS: 650.0000 mg | ORAL_TABLET | Freq: Four times a day (QID) | ORAL | Status: DC | PRN

## 2017-08-26 MED ORDER — HYDRALAZINE HCL 20 MG/ML IJ SOLN: 10.0000 mg | INTRAMUSCULAR | Status: DC | PRN

## 2017-08-26 MED ORDER — ONDANSETRON HCL 4 MG/2ML IJ SOLN
4.0000 mg | Freq: Four times a day (QID) | INTRAMUSCULAR | Status: DC | PRN
Start: 2017-08-26 — End: 2017-08-26

## 2017-08-26 MED ORDER — HYDROXYZINE HCL 25 MG PO TABS: 50.0000 mg | ORAL_TABLET | Freq: Every evening | ORAL | Status: DC | PRN

## 2017-08-26 MED ORDER — LOSARTAN POTASSIUM-HCTZ 100-12.5 MG PO TABS: 1.0000 | ORAL_TABLET | Freq: Every day | ORAL | Status: DC

## 2017-08-26 MED ORDER — NITROGLYCERIN 0.4 MG SL SUBL
0.4000 mg | SUBLINGUAL_TABLET | Freq: Once | SUBLINGUAL | Status: AC
  Administered 2017-08-26: 0.4 mg via SUBLINGUAL
  Filled 2017-08-26: qty 1

## 2017-08-26 MED ORDER — POTASSIUM CHLORIDE CRYS ER 20 MEQ PO TBCR
40.0000 meq | EXTENDED_RELEASE_TABLET | Freq: Once | ORAL | Status: AC
  Administered 2017-08-26: 40 meq via ORAL
  Filled 2017-08-26: qty 2

## 2017-08-26 MED ORDER — ENOXAPARIN SODIUM 40 MG/0.4ML ~~LOC~~ SOLN
40.0000 mg | SUBCUTANEOUS | Status: DC
  Administered 2017-08-26: 40 mg via SUBCUTANEOUS
  Filled 2017-08-26: qty 0.4

## 2017-08-26 MED ORDER — ASPIRIN 325 MG PO TABS
325.0000 mg | ORAL_TABLET | Freq: Once | ORAL | Status: AC
  Administered 2017-08-26: 325 mg via ORAL
  Filled 2017-08-26: qty 1

## 2017-08-26 MED ORDER — ENOXAPARIN SODIUM 40 MG/0.4ML ~~LOC~~ SOLN: 40.0000 mg | SUBCUTANEOUS | Status: DC

## 2017-08-26 MED ORDER — CIPROFLOXACIN HCL 250 MG PO TABS
500.0000 mg | ORAL_TABLET | Freq: Two times a day (BID) | ORAL | Status: DC
  Administered 2017-08-26 – 2017-08-27 (×2): 500 mg via ORAL
  Filled 2017-08-26 (×2): qty 2

## 2017-08-26 MED ORDER — HYDROCHLOROTHIAZIDE 12.5 MG PO CAPS
12.5000 mg | ORAL_CAPSULE | Freq: Every day | ORAL | Status: DC
  Filled 2017-08-26: qty 1

## 2017-08-26 MED ORDER — METOPROLOL SUCCINATE ER 25 MG PO TB24
25.0000 mg | ORAL_TABLET | Freq: Two times a day (BID) | ORAL | Status: DC
  Administered 2017-08-26 – 2017-08-27 (×2): 25 mg via ORAL
  Filled 2017-08-26 (×4): qty 1

## 2017-08-26 MED ORDER — ONDANSETRON HCL 4 MG/2ML IJ SOLN: 4.0000 mg | Freq: Four times a day (QID) | INTRAMUSCULAR | Status: DC | PRN

## 2017-08-26 NOTE — ED Provider Notes (Signed)
[facility] EMERGENCY DEPARTMENT Provider Note   CSN: [id number] Arrival date & time: 08/26/17  1428     History   Chief Complaint Chief Complaint  Patient presents with  . Chest Pain    HPI Vanessa Mcconnell is a 57 y.o. female.  Intermittent chest pain for 2 days with associated dyspnea and nausea.  She also felt her heart fluttering and skipping.  Chest pain is described as a pressure sensation and not associated with any activity.  She has hypertension and has been taking her medications.  No diabetes or cigarette smoking.  Her father has had 3 MIs.  She has been evaluated by [facility] cardiology in the past.  Her blood pressure is typically in the 130/85 range.      Past Medical History:  Diagnosis Date  . Arthritis   . Essential hypertension   . History of migraine   . Hyperlipidemia   . Malignant hyperthermia    Reportedly at age 62 with tonsillectomy  . Pancreatitis    Reportedly single episode in childhood  . Small intestinal bacterial overgrowth 12/2015   Positive breath test  . Thoracic disc herniation     Patient Active Problem List   Diagnosis Date Noted  . Small intestinal bacterial overgrowth 12/26/2015  . Dysphagia 12/26/2015  . Nausea without vomiting 11/04/2015  . Elevated blood pressure 05/08/2015  . Bloating 02/05/2015  . Nausea with vomiting 02/05/2015  . Hematochezia 02/05/2015    Past Surgical History:  Procedure Laterality Date  . ABDOMINAL HYSTERECTOMY    . COLONOSCOPY N/A 02/11/2015   [id number]:[id number] diverticulosis in the sigmoid colon/moderate internal hemorrhoids  . DEBRIDEMENT LEG     cellulitis debridement as a teen  . ESOPHAGEAL DILATION  02/11/2015   Procedure: ESOPHAGEAL DILATION;  Surgeon: Danie Binder, MD;  Location[facility];  Service: Endoscopy;;  . ESOPHAGOGASTRODUODENOSCOPY N/A 02/11/2015   OFB:[id number] size HH/mild non-erosive gastritis  . TONSILLECTOMY    . ULNER NERVE SURGERY     Right Elbow    OB History    No data available       Home Medications    Prior to Admission medications   Medication Sig Start Date End Date Taking? Authorizing Provider  acetaminophen (TYLENOL) 500 MG tablet Take 500 mg by mouth every 6 (six) hours as needed.    [provider]  albuterol (PROVENTIL HFA;VENTOLIN HFA) 108 (90 Base) MCG/ACT inhaler Inhale into the lungs every 6 (six) hours as needed for wheezing or shortness of breath.    [provider]  losartan-hydrochlorothiazide (HYZAAR) 100-12.5 MG tablet Take 1 tablet by mouth daily.  10/30/15   [provider]  metoprolol succinate (TOPROL-XL) 25 MG 24 hr tablet Take 25 mg by mouth 2 (two) times daily. 06/14/17   [provider]    Family History Family History  Problem Relation Age of Onset  . Hypertension Mother   . Diabetes Father   . Heart attack Father        CABG X'3 & stent placement  . Hypertension Father   . Epilepsy Sister   . Supraventricular tachycardia Sister   . Other Sister        "hole in heart"  . Heart attack Maternal Grandfather   . Peripheral Artery Disease Sister   . CAD Sister   . Congestive Heart Failure Sister        mild diastolic heart failure  . Heart disease Son   . Heart disease Son   .  Colon cancer Neg Hx     Social History Social History   Tobacco Use  . Smoking status: Never Smoker  . Smokeless tobacco: Never Used  . Tobacco comment: Never smoked  Substance Use Topics  . Alcohol use: No    Alcohol/week: 0.0 oz  . Drug use: No     Allergies   Benadryl [diphenhydramine hcl]; Erythromycin; Iohexol; Latex; Penicillins; and Tetracyclines & related   Review of Systems Review of Systems  All other systems reviewed and are negative.    Physical Exam Updated Vital Signs BP (!) 177/110   Pulse 71   Temp 97.6 F (36.4 C) (Oral)   Resp 17   Ht 5\' 3"  (1.6 m)   Wt 78.9 kg (174 lb)   SpO2 97%   BMI 30.82 kg/m   Physical Exam  Constitutional: She is oriented to  person, place, and time. She appears well-developed and well-nourished.  HENT:  Head: Normocephalic and atraumatic.  Eyes: Conjunctivae are normal.  Neck: Neck supple.  Cardiovascular: Normal rate and regular rhythm.  Pulmonary/Chest: Effort normal and breath sounds normal.  Abdominal: Soft. Bowel sounds are normal.  Musculoskeletal: Normal range of motion.  Neurological: She is alert and oriented to person, place, and time.  Skin: Skin is warm and dry.  Psychiatric: She has a normal mood and affect. Her behavior is normal.  Nursing note and vitals reviewed.    ED Treatments / Results  Labs (all labs ordered are listed, but only abnormal results are displayed) Labs Reviewed  COMPREHENSIVE METABOLIC PANEL - Abnormal; Notable for the following components:      Result Value   Potassium 3.3 (*)    All other components within normal limits  CBC WITH DIFFERENTIAL/PLATELET  TROPONIN I  TROPONIN I    EKG  EKG Interpretation  Date/Time:  Thursday August 26 2017 14:33:41 EST Ventricular Rate:  77 PR Interval:  174 QRS Duration: 68 QT Interval:  366 QTC Calculation: 414 R Axis:   17 Text Interpretation:  Normal sinus rhythm Normal ECG Confirmed by Nat Christen [phone]) on 08/26/2017 7:14:42 PM       Radiology Dg Chest 2 View  Result Date: 08/26/2017 CLINICAL DATA:  Chest pressure EXAM: CHEST  2 VIEW COMPARISON:  06/01/2017 FINDINGS: Lungs are hyperexpanded. The lungs are clear without focal pneumonia, edema, pneumothorax or pleural effusion. The cardiopericardial silhouette is within normal limits for size. The visualized bony structures of the thorax are intact. IMPRESSION: Hyperexpansion without acute cardiopulmonary findings. Electronically Signed   By: Misty Stanley M.D.   On: 08/26/2017 14:57    Procedures Procedures (including critical care time)  Medications Ordered in ED Medications  ondansetron (ZOFRAN-ODT) disintegrating tablet 4 mg (4 mg Oral Given 08/26/17 1445)    nitroGLYCERIN (NITROSTAT) SL tablet 0.4 mg (0.4 mg Sublingual Given 08/26/17 1920)  aspirin tablet 325 mg (325 mg Oral Given 08/26/17 2001)     Initial Impression / Assessment and Plan / ED Course  I have reviewed the triage vital signs and the nursing notes.  Pertinent labs & imaging results that were available during my care of the patient were reviewed by me and considered in my medical decision making (see chart for details).    Patient with moderate cardiac risk factors presents with chest pain, dyspnea, nausea.  EKG and first troponin negative.  Patient was given aspirin and nitroglycerin which helped a moderate amount.  Discussed with Dr. Olevia Bowens.  Admit for observation.   Final Clinical Impressions(s) / ED  Diagnoses   Final diagnoses:  Chest pain, unspecified type  Hypertension, unspecified type    ED Discharge Orders    None       Nat Christen, MD 08/26/17 2031

## 2017-08-26 NOTE — ED Notes (Signed)
AC to bring Toprol XL.

## 2017-08-26 NOTE — ED Triage Notes (Signed)
PT c/o left sided chest pain, nausea, headache x2 days. PT states she was seen at her PCP (Dr. Woody Seller) today and told to come to ED due to rapid hr today in the office.

## 2017-08-26 NOTE — ED Notes (Signed)
Pt reports she takes two BP meds at this time in the evening.

## 2017-08-26 NOTE — H&P (Signed)
History and Physical    Vanessa Mcconnell RKY:[id number] DOB: Apr 17, 1961 DOA: 08/26/2017  PCP: Glenda Chroman, MD   Patient coming from: Home.  I have personally briefly reviewed patient's old medical records in [place]  Chief Complaint: Chest pain.  HPI: Vanessa Mcconnell is a 57 y.o. female with medical history significant of osteoarthritis, asthma, essential hypertension, history of migraine, hyperlipidemia, malignant hyperthermia, pancreatitis during childhood, thoracic disc herniation who is coming to the emergency department with complaints of chest pain since last night, pressure-like, radiated to her left shoulder, associated with nausea, dyspnea, hot flash sensation followed by mild diaphoresis and palpitations.  The pain does not change with activity.  However, the patient states that she has been getting dyspneic by walking just a few 100 feet for the past few weeks.  Denies orthopnea, PND or pitting edema of the lower extremities.  She had a similar episode back in November and had a normal echocardiogram and stress test.  She denies fever, chills, but states she gets fatigued and dyspneic easily with exertion.  No sore throat, productive cough, wheezing, hemoptysis, abdominal pain, emesis, diarrhea, constipation, melena or hematochezia.  No dysuria, frequency, hematuria or oliguria.  Denies pruritus or skin rashes.  She states that she has been feeling very anxious and has been having trouble sleeping for the past several weeks.  ED Course: Initial vital signs temperature 37 C (98.6 F), pulse 74, respirations 22, blood pressure 200 180 mmHg and O2 sat 100% on room air.  EKG was normal sinus rhythm.  Troponin level was normal.CBC and differential were normal.  Her CMP showed a potassium of 3.3 mmol/L, all other chemistry values were within normal range.  Her chest radiograph show hyperinflation of the lungs, but was otherwise normal.  She was given nitroglycerin  sublingually x1 and aspirin 324 mg orally in the emergency department.  She states her symptoms are a lot better.  Review of Systems: As per HPI otherwise 10 point review of systems negative.    Past Medical History:  Diagnosis Date  . Arthritis   . Asthma   . Essential hypertension   . History of migraine   . Hyperlipidemia   . Malignant hyperthermia    Reportedly at age 40 with tonsillectomy  . Pancreatitis    Reportedly single episode in childhood  . Small intestinal bacterial overgrowth 12/2015   Positive breath test  . Thoracic disc herniation     Past Surgical History:  Procedure Laterality Date  . ABDOMINAL HYSTERECTOMY    . COLONOSCOPY N/A 02/11/2015   BTD:VVOHYWVP diverticulosis in the sigmoid colon/moderate internal hemorrhoids  . DEBRIDEMENT LEG     cellulitis debridement as a teen  . ESOPHAGEAL DILATION  02/11/2015   Procedure: ESOPHAGEAL DILATION;  Surgeon: Danie Binder, MD;  Location: [facility];  Service: Endoscopy;;  . ESOPHAGOGASTRODUODENOSCOPY N/A 02/11/2015   XTG:GYIRSWNI size HH/mild non-erosive gastritis  . TONSILLECTOMY    . ULNER NERVE SURGERY     Right Elbow     reports that  has never smoked. she has never used smokeless tobacco. She reports that she does not drink alcohol or use drugs.  Allergies  Allergen Reactions  . Benadryl [Diphenhydramine Hcl] Hives and Other (See Comments)    Passed out  . Erythromycin Hives  . Iohexol      Code: HIVES, Desc: Anaphylaxis/Hives, Onset Date: 62703500   . Latex Rash  . Penicillins Swelling and Rash    Has patient had  a PCN reaction causing immediate rash, facial/tongue/throat swelling, SOB or lightheadedness with hypotension: Yes Has patient had a PCN reaction causing severe rash involving mucus membranes or skin necrosis: No Has patient had a PCN reaction that required hospitalization: No Has patient had a PCN reaction occurring within the last 10 years: No If all of the above answers are "NO", then  may proceed with Cephalosporin use.  . Tetracyclines & Related Rash    Family History  Problem Relation Age of Onset  . Hypertension Mother   . Diabetes Father   . Heart attack Father        CABG X'3 & stent placement  . Hypertension Father   . Epilepsy Sister   . Supraventricular tachycardia Sister   . Other Sister        "hole in heart"  . Heart attack Maternal Grandfather   . Peripheral Artery Disease Sister   . CAD Sister   . Congestive Heart Failure Sister        mild diastolic heart failure  . Heart disease Son   . Heart disease Son   . Colon cancer Neg Hx     Prior to Admission medications   Medication Sig Start Date End Date Taking? Authorizing Provider  amLODipine (NORVASC) 5 MG tablet Take 5 mg by mouth every evening.  06/24/17  Yes [provider]  ciprofloxacin (CIPRO) 500 MG tablet Take 500 mg by mouth 2 (two) times daily.   Yes [provider]  losartan-hydrochlorothiazide (HYZAAR) 100-12.5 MG tablet Take 1 tablet by mouth daily.  10/30/15  Yes [provider]  metoprolol succinate (TOPROL-XL) 25 MG 24 hr tablet Take 25 mg by mouth 2 (two) times daily. 06/14/17  Yes [provider]  vitamin C (ASCORBIC ACID) 500 MG tablet Take 500 mg by mouth daily.   Yes [provider]  zinc gluconate 50 MG tablet Take 50 mg by mouth daily.   Yes [provider]  albuterol (PROVENTIL HFA;VENTOLIN HFA) 108 (90 Base) MCG/ACT inhaler Inhale into the lungs every 6 (six) hours as needed for wheezing or shortness of breath.    [provider]    Physical Exam: Vitals:   08/26/17 2030 08/26/17 2100 08/26/17 2120 08/26/17 2230  BP: (!) 181/105 (!) 196/126 (!) 181/106 (!) 150/81  Pulse: 71 64 73 64  Resp: 20 12 15 16   Temp:    98 F (36.7 C)  TempSrc:    Oral  SpO2: 98% 99% 99% 100%  Weight:    77.6 kg (171 lb 1.6 oz)  Height:    5\' 3"  (1.6 m)    Constitutional: NAD, calm, comfortable Eyes: PERRL, lids and  conjunctivae normal ENMT: Mucous membranes are moist. Posterior pharynx clear of any exudate or lesions. Neck: Normal, supple, no masses, no thyromegaly Respiratory: clear to auscultation bilaterally, no wheezing, no crackles. Normal respiratory effort. No accessory muscle use.  Cardiovascular: Regular rate and rhythm, no murmurs / rubs / gallops. No extremity edema. 2+ pedal pulses. No carotid bruits.  Abdomen: Obese, soft, no tenderness, no masses palpated. No hepatosplenomegaly. Bowel sounds positive.  Musculoskeletal: no clubbing / cyanosis. Good ROM, no contractures. Normal muscle tone.  Skin: no rashes, lesions, ulcers on limited dermatological examination. Neurologic: CN 2-12 grossly intact. Sensation intact, DTR normal. Strength 5/5 in all 4.  Psychiatric: Normal judgment and insight. Alert and oriented x 4. Normal mood.    Labs on Admission: I have personally reviewed following labs and imaging studies  CBC:  Recent Labs  Lab 08/26/17 1453 08/26/17 2122  WBC 7.9 11.7*  NEUTROABS 5.5 8.7*  HGB 14.9 15.1*  HCT 45.2 45.6  MCV 90.9 90.8  PLT 350 478   Basic Metabolic Panel: Recent Labs  Lab 08/26/17 1453 08/26/17 1914  NA 140  --   K 3.3*  --   CL 103  --   CO2 25  --   GLUCOSE 99  --   BUN 16  --   CREATININE 0.75  --   CALCIUM 9.6  --   MG  --  2.1   GFR: Estimated Creatinine Clearance: 77.5 mL/min (by C-G formula based on SCr of 0.75 mg/dL). Liver Function Tests: Recent Labs  Lab 08/26/17 1453  AST 16  ALT 17  ALKPHOS 125  BILITOT 0.7  PROT 7.9  ALBUMIN 4.3   No results for input(s): LIPASE, AMYLASE in the last 168 hours. No results for input(s): AMMONIA in the last 168 hours. Coagulation Profile: No results for input(s): INR, PROTIME in the last 168 hours. Cardiac Enzymes: Recent Labs  Lab 08/26/17 1453 08/26/17 1914  TROPONINI <0.03 <0.03   BNP (last 3 results) No results for input(s): PROBNP in the last 8760 hours. HbA1C: No results for  input(s): HGBA1C in the last 72 hours. CBG: No results for input(s): GLUCAP in the last 168 hours. Lipid Profile: No results for input(s): CHOL, HDL, LDLCALC, TRIG, CHOLHDL, LDLDIRECT in the last 72 hours. Thyroid Function Tests: No results for input(s): TSH, T4TOTAL, FREET4, T3FREE, THYROIDAB in the last 72 hours. Anemia Panel: No results for input(s): VITAMINB12, FOLATE, FERRITIN, TIBC, IRON, RETICCTPCT in the last 72 hours. Urine analysis:    Component Value Date/Time   COLORURINE STRAW (A) 02/13/2015 1040   APPEARANCEUR CLEAR 02/13/2015 1040   LABSPEC <1.005 (L) 02/13/2015 1040   PHURINE 6.0 02/13/2015 1040   GLUCOSEU NEGATIVE 02/13/2015 1040   HGBUR SMALL (A) 02/13/2015 1040   BILIRUBINUR NEGATIVE 02/13/2015 1040   KETONESUR NEGATIVE 02/13/2015 1040   PROTEINUR NEGATIVE 02/13/2015 1040   UROBILINOGEN 0.2 02/13/2015 1040   NITRITE NEGATIVE 02/13/2015 1040   LEUKOCYTESUR NEGATIVE 02/13/2015 1040    Radiological Exams on Admission: Dg Chest 2 View  Result Date: 08/26/2017 CLINICAL DATA:  Chest pressure EXAM: CHEST  2 VIEW COMPARISON:  06/01/2017 FINDINGS: Lungs are hyperexpanded. The lungs are clear without focal pneumonia, edema, pneumothorax or pleural effusion. The cardiopericardial silhouette is within normal limits for size. The visualized bony structures of the thorax are intact. IMPRESSION: Hyperexpansion without acute cardiopulmonary findings. Electronically Signed   By: Misty Stanley M.D.   On: 08/26/2017 14:57    EKG: Independently reviewed. Vent. rate 77 BPM PR interval 174 ms QRS duration 68 ms QT/QTc 366/414 ms P-R-T axes 33 17 26 Normal sinus rhythm Normal ECG  Assessment/Plan Principal Problem:   Chest pain Observation/telemetry. Trend troponin level. Continue metoprolol succinate 25 mg p.o. twice daily. Aspirin 81 mg p.o. daily. Recheck EKG in a.m. Consider cardiology evaluation in the morning if no improvement.  Active Problems:   Essential  hypertension Continue amlodipine 5 mg p.o. every evening. Continue Hyzaar 100-12.5 mg p.o. Daily. Continue metoprolol 25 mg p.o. twice daily.    Hyperlipidemia Not on medical therapy. Avoid high cholesterol rich foods. Advised to increase fiber intake.    Hypokalemia Replaced. Magnesium level was normal. Follow-up potassium level. The patient would benefit from daily potassium supplementation.   DVT prophylaxis: Lovenox SQ. Code Status: Full code. Family Communication: Her husband was present in  the ED. Disposition Plan: Overnight telemetry with troponin level trending. Consults called:  Admission status: Observation/telemetry.   Reubin Milan MD [facility] Pager [phone].  If 7PM-7AM, please contact night-coverage [email or web address] Password [facility]  08/26/2017, 10:55 PM

## 2017-08-27 DIAGNOSIS — R079 Chest pain, unspecified: Secondary | ICD-10-CM | POA: Diagnosis not present

## 2017-08-27 LAB — BASIC METABOLIC PANEL
Anion gap: 8 (ref 5–15)
BUN: 14 mg/dL (ref 6–20)
CHLORIDE: 103 mmol/L (ref 101–111)
CO2: 27 mmol/L (ref 22–32)
Calcium: 9.2 mg/dL (ref 8.9–10.3)
Creatinine, Ser: 0.74 mg/dL (ref 0.44–1.00)
GFR calc Af Amer: >60 mL/min (ref >60)
GLUCOSE: 120 mg/dL — AB (ref 65–99)
POTASSIUM: 3.9 mmol/L (ref 3.5–5.1)
Sodium: 138 mmol/L (ref 135–145)

## 2017-08-27 LAB — TROPONIN I: Troponin I: <0.03 ng/mL (ref <0.03)

## 2017-08-27 MED ORDER — ASPIRIN EC 81 MG PO TBEC
81.0000 mg | DELAYED_RELEASE_TABLET | Freq: Every day | ORAL | Status: DC
  Administered 2017-08-27: 81 mg via ORAL
  Filled 2017-08-27: qty 1

## 2017-08-27 MED ORDER — ALPRAZOLAM 0.25 MG PO TABS: 0.2500 mg | ORAL_TABLET | Freq: Three times a day (TID) | ORAL | 0 refills | Status: AC | PRN

## 2017-08-27 MED ORDER — POTASSIUM CHLORIDE CRYS ER 10 MEQ PO TBCR
10.0000 meq | EXTENDED_RELEASE_TABLET | Freq: Every day | ORAL | Status: DC
  Administered 2017-08-27: 10 meq via ORAL
  Filled 2017-08-27: qty 1

## 2017-08-27 NOTE — Progress Notes (Signed)
Pt's IV catheter removed and intact. Pt's IV site clean dry and intact. Discharge instructions including medications and follow up appointments were reviewed and discussed with patient. All questions were answered and no further questions at this time. Pt in stable condition and in no acute distress at time of discharge. Pt will be escorted by nurse tech.  

## 2017-08-27 NOTE — Discharge Summary (Signed)
Physician Discharge Summary  Vanessa Mcconnell:[id number] DOB: [birth year] DOA: 08/26/2017  PCP: Glenda Chroman, MD  Admit date: 08/26/2017  Discharge date: 08/27/2017  Admitted From:Home  Disposition:  Home  Recommendations for Outpatient Follow-up:  1. Follow up with PCP in 1 week 2. Please obtain BMP in office at next visit to assess potassium  Home Health:N/A  Equipment/Devices:N/A  Discharge Condition:Stable  CODE STATUS: Full  Diet recommendation: Heart Healthy  Brief/Interim Summary: This is a 57 year old female who presented with chest pain with radiation to her left shoulder that was described as a pressure-like sensation.  It was associated with some nausea, dyspnea, hot flash sensation followed by some mild diaphoresis and palpitations.  The pain was atypical in nature and she was admitted for rule out of acute coronary syndrome.  Her troponins have remained negative during the course of the stay and EKGs have demonstrated no signs of ischemia.  She has had multiple repeated episodes of this in the past and has seen Dr. Harl Bowie of cardiology and has also had a normal echocardiogram as well as stress test back in November 2018.  She is asymptomatic at this time of discharge and feels that she may be having some anxiety related chest pain for which she has been prescribed some Xanax as needed for now.  Her potassium was mildly deficient while here and this has been replaced.  No other acute events noted during this admission.   Discharge Diagnoses:  Principal Problem:   Chest pain Active Problems:   Essential hypertension   Hyperlipidemia   Hypokalemia  1. Atypical chest pain likely secondary to panic attacks.  ACS has been ruled out.  Patient given prescription for Xanax as needed for future episodes.  She will discuss this further with her primary care provider for further management. 2. Mild hypokalemia-resolved.  Will require repeat BMP in the near future to assess  for further hypokalemia.  Potassium on discharge was 3.9. 3. Hypertension-stable.  Continue current home medications. 4. Dyslipidemia.  Continue to monitor in the outpatient setting.  Discharge Instructions  Discharge Instructions    Call MD for:  difficulty breathing, headache or visual disturbances   Complete by:  As directed    Call MD for:  persistant dizziness or light-headedness   Complete by:  As directed    Call MD for:  severe uncontrolled pain   Complete by:  As directed    Diet - low sodium heart healthy   Complete by:  As directed    Increase activity slowly   Complete by:  As directed      Allergies as of 08/27/2017      Reactions   Benadryl [diphenhydramine Hcl] Hives, Other (See Comments)   Passed out   Erythromycin Hives   Iohexol     Code: HIVES, Desc: Anaphylaxis/Hives, Onset Date: 27782423   Latex Rash   Penicillins Swelling, Rash   Has patient had a PCN reaction causing immediate rash, facial/tongue/throat swelling, SOB or lightheadedness with hypotension: Yes Has patient had a PCN reaction causing severe rash involving mucus membranes or skin necrosis: No Has patient had a PCN reaction that required hospitalization: No Has patient had a PCN reaction occurring within the last 10 years: No If all of the above answers are "NO", then may proceed with Cephalosporin use.   Tetracyclines & Related Rash      Medication List    TAKE these medications   albuterol 108 (90 Base) MCG/ACT inhaler Commonly known as:  PROVENTIL HFA;VENTOLIN HFA Inhale into the lungs every 6 (six) hours as needed for wheezing or shortness of breath.   ALPRAZolam 0.25 MG tablet Commonly known as:  XANAX Take 1 tablet (0.25 mg total) by mouth 3 (three) times daily as needed for anxiety.   amLODipine 5 MG tablet Commonly known as:  NORVASC Take 5 mg by mouth every evening.   ciprofloxacin 500 MG tablet Commonly known as:  CIPRO Take 500 mg by mouth 2 (two) times daily.    losartan-hydrochlorothiazide 100-12.5 MG tablet Commonly known as:  HYZAAR Take 1 tablet by mouth daily.   metoprolol succinate 25 MG 24 hr tablet Commonly known as:  TOPROL-XL Take 25 mg by mouth 2 (two) times daily.   vitamin C 500 MG tablet Commonly known as:  ASCORBIC ACID Take 500 mg by mouth daily.   zinc gluconate 50 MG tablet Take 50 mg by mouth daily.       Allergies  Allergen Reactions  . Benadryl [Diphenhydramine Hcl] Hives and Other (See Comments)    Passed out  . Erythromycin Hives  . Iohexol      Code: HIVES, Desc: Anaphylaxis/Hives, Onset Date: 82423536   . Latex Rash  . Penicillins Swelling and Rash    Has patient had a PCN reaction causing immediate rash, facial/tongue/throat swelling, SOB or lightheadedness with hypotension: Yes Has patient had a PCN reaction causing severe rash involving mucus membranes or skin necrosis: No Has patient had a PCN reaction that required hospitalization: No Has patient had a PCN reaction occurring within the last 10 years: No If all of the above answers are "NO", then may proceed with Cephalosporin use.  . Tetracyclines & Related Rash    Consultations:  None   Procedures/Studies: Dg Chest 2 View  Result Date: 08/26/2017 CLINICAL DATA:  Chest pressure EXAM: CHEST  2 VIEW COMPARISON:  06/01/2017 FINDINGS: Lungs are hyperexpanded. The lungs are clear without focal pneumonia, edema, pneumothorax or pleural effusion. The cardiopericardial silhouette is within normal limits for size. The visualized bony structures of the thorax are intact. IMPRESSION: Hyperexpansion without acute cardiopulmonary findings. Electronically Signed   By: Misty Stanley M.D.   On: 08/26/2017 14:57    Discharge Exam: Vitals:   08/26/17 2230 08/27/17 0545  BP: (!) 150/81 (!) 98/52  Pulse: 64 60  Resp: 16 16  Temp: 98 F (36.7 C) 98.4 F (36.9 C)  SpO2: 100% 96%   Vitals:   08/26/17 2100 08/26/17 2120 08/26/17 2230 08/27/17 0545  BP: (!)  196/126 (!) 181/106 (!) 150/81 (!) 98/52  Pulse: 64 73 64 60  Resp: 12 15 16 16   Temp:   98 F (36.7 C) 98.4 F (36.9 C)  TempSrc:   Oral Oral  SpO2: 99% 99% 100% 96%  Weight:   77.6 kg (171 lb 1.6 oz)   Height:   5\' 3"  (1.6 m)     General: Pt is alert, awake, not in acute distress Cardiovascular: RRR, S1/S2 +, no rubs, no gallops Respiratory: CTA bilaterally, no wheezing, no rhonchi Abdominal: Soft, NT, ND, bowel sounds + Extremities: no edema, no cyanosis    The results of significant diagnostics from this hospitalization (including imaging, microbiology, ancillary and laboratory) are listed below for reference.     Microbiology: No results found for this or any previous visit (from the past 240 hour(s)).   Labs: BNP (last 3 results) No results for input(s): BNP in the last 8760 hours. Basic Metabolic Panel: Recent Labs  Lab 08/26/17  [place] 08/26/17 1914 08/27/17 0840  NA 140  --  138  K 3.3*  --  3.9  CL 103  --  103  CO2 25  --  27  GLUCOSE 99  --  120*  BUN 16  --  14  CREATININE 0.75  --  0.74  CALCIUM 9.6  --  9.2  MG  --  2.1  --    Liver Function Tests: Recent Labs  Lab 08/26/17 1453  AST 16  ALT 17  ALKPHOS 125  BILITOT 0.7  PROT 7.9  ALBUMIN 4.3   No results for input(s): LIPASE, AMYLASE in the last 168 hours. No results for input(s): AMMONIA in the last 168 hours. CBC: Recent Labs  Lab 08/26/17 1453 08/26/17 2122  WBC 7.9 11.7*  NEUTROABS 5.5 8.7*  HGB 14.9 15.1*  HCT 45.2 45.6  MCV 90.9 90.8  PLT 350 343   Cardiac Enzymes: Recent Labs  Lab 08/26/17 1453 08/26/17 1914 08/27/17 0157  TROPONINI <0.03 <0.03 <0.03   BNP: Invalid input(s): POCBNP CBG: No results for input(s): GLUCAP in the last 168 hours. D-Dimer No results for input(s): DDIMER in the last 72 hours. Hgb A1c No results for input(s): HGBA1C in the last 72 hours. Lipid Profile No results for input(s): CHOL, HDL, LDLCALC, TRIG, CHOLHDL, LDLDIRECT in the last 72  hours. Thyroid function studies No results for input(s): TSH, T4TOTAL, T3FREE, THYROIDAB in the last 72 hours.  Invalid input(s): FREET3 Anemia work up No results for input(s): VITAMINB12, FOLATE, FERRITIN, TIBC, IRON, RETICCTPCT in the last 72 hours. Urinalysis    Component Value Date/Time   COLORURINE STRAW (A) 02/13/2015 1040   APPEARANCEUR CLEAR 02/13/2015 1040   LABSPEC <1.005 (L) 02/13/2015 1040   PHURINE 6.0 02/13/2015 1040   GLUCOSEU NEGATIVE 02/13/2015 1040   HGBUR SMALL (A) 02/13/2015 1040   BILIRUBINUR NEGATIVE 02/13/2015 1040   KETONESUR NEGATIVE 02/13/2015 1040   PROTEINUR NEGATIVE 02/13/2015 1040   UROBILINOGEN 0.2 02/13/2015 1040   NITRITE NEGATIVE 02/13/2015 1040   LEUKOCYTESUR NEGATIVE 02/13/2015 1040   Sepsis Labs Invalid input(s): PROCALCITONIN,  WBC,  LACTICIDVEN Microbiology No results found for this or any previous visit (from the past 240 hour(s)).   Time coordinating discharge: Over 30 minutes  SIGNED:   Rodena Goldmann, DO [facility] 08/27/2017, 10:40 AM Pager [phone]  If 7PM-7AM, please contact night-coverage [email or web address] Password [email or web address]

## 2017-09-27 ENCOUNTER — Encounter: Payer: Self-pay | Admitting: Gastroenterology

## 2017-12-02 ENCOUNTER — Encounter

## 2017-12-02 ENCOUNTER — Encounter: Payer: Self-pay | Admitting: *Deleted

## 2017-12-02 ENCOUNTER — Ambulatory Visit: Payer: BLUE CROSS/BLUE SHIELD | Admitting: Gastroenterology

## 2017-12-02 ENCOUNTER — Encounter: Payer: Self-pay | Admitting: Gastroenterology

## 2017-12-02 VITALS — BP 175/118 | HR 76 | Temp 97.3°F | Ht 63.0 in | Wt 178.2 lb

## 2017-12-02 DIAGNOSIS — R1310 Dysphagia, unspecified: Secondary | ICD-10-CM

## 2017-12-02 DIAGNOSIS — K5900 Constipation, unspecified: Secondary | ICD-10-CM | POA: Diagnosis not present

## 2017-12-02 DIAGNOSIS — R195 Other fecal abnormalities: Secondary | ICD-10-CM | POA: Diagnosis not present

## 2017-12-02 DIAGNOSIS — R1011 Right upper quadrant pain: Secondary | ICD-10-CM | POA: Diagnosis not present

## 2017-12-02 DIAGNOSIS — K219 Gastro-esophageal reflux disease without esophagitis: Secondary | ICD-10-CM

## 2017-12-02 MED ORDER — PANTOPRAZOLE SODIUM 40 MG PO TBEC: 40.0000 mg | DELAYED_RELEASE_TABLET | Freq: Every day | ORAL | 3 refills | Status: AC

## 2017-12-02 NOTE — Progress Notes (Signed)
REVIEWED-NO ADDITIONAL RECOMMENDATIONS.  Primary Care Physician:  Glenda Chroman, MD Primary Gastroenterologist:  Dr. Oneida Alar   Chief Complaint  Patient presents with  . heme + stool    [facility] with abdominal pain March 2019    HPI:   Vanessa Mcconnell is a 57 y.o. female presenting today at the request of Dr. Woody Seller secondary to heme positive stool. She was last seen here at [facility] in June 2017. History of chronic nausea, diarrhea, +SIBO on breath test in past. Last colonoscopy in 2016 with moderate diverticulosis in sigmoid colon and internal hemorrhoids. EGD Aug 2016 with moderate sized hiatal hernia, mild non-erosive gastritis. Dilation completed at that time. BPE in June 2017 with normal esophageal motility.   Inpatient at [facility] March 2019 with severe pain described as "20/10", WBC count 15.3, lipase 27, CT scan with diverticulosis but no evidence of diverticulitis or pancreatitis. Felt to possibly have shingles. Went home and a few weeks later had LLQ pain and was empirically treated for diverticulitis. No imaging at that time. Yesterday had RUQ discomfort. Overall pain is better. Yesterday the pain hit her so hard that she had to pull over on the side of the road. Constantly having chest pain but has had cardiology evaluation with negative issues. Not on a PPI. Intermittent solid food dysphagia. Will get stuck in mid chest. No NSAIDs. Only tylenol.   Stays nauseated all the time. Has chronic GERD. At night while laying supine, burps a lot during the night. Feels like taking in air and has to burp. Sees pulmonologist tomorrow. Has recently been diagnosed with fibromyalgia.   Recently has had more issues with constipation.   She notes a history of malignant hyperthermia.     Past Medical History:  Diagnosis Date  . Arthritis   . Asthma   . Essential hypertension   . Fibromyalgia   . History of migraine   . Hyperlipidemia   . Malignant hyperthermia    Reportedly at  age 31 with tonsillectomy  . Pancreatitis    Reportedly single episode in childhood  . Small intestinal bacterial overgrowth 12/2015   Positive breath test  . Thoracic disc herniation     Past Surgical History:  Procedure Laterality Date  . ABDOMINAL HYSTERECTOMY    . COLONOSCOPY N/A 02/11/2015   [id number] diverticulosis in the sigmoid colon/moderate internal hemorrhoids  . DEBRIDEMENT LEG     cellulitis debridement as a teen  . ESOPHAGEAL DILATION  02/11/2015   Procedure: ESOPHAGEAL DILATION;  Surgeon: Danie Binder, MD;  [facility];  Service: Endoscopy;;  . ESOPHAGOGASTRODUODENOSCOPY N/A 02/11/2015   [id number] size HH/mild non-erosive gastritis  . strep infection     causing skin infection, debridement buttocks, hip, legs  . TONSILLECTOMY    . ULNER NERVE SURGERY     Right Elbow    Current Outpatient Medications  Medication Sig Dispense Refill  . albuterol (PROVENTIL HFA;VENTOLIN HFA) 108 (90 Base) MCG/ACT inhaler Inhale into the lungs every 6 (six) hours as needed for wheezing or shortness of breath.    Marland Kitchen amLODipine (NORVASC) 5 MG tablet Take 5 mg by mouth every evening.   0  . gabapentin (NEURONTIN) 300 MG capsule Take 300 mg by mouth 3 (three) times daily.    Marland Kitchen losartan-hydrochlorothiazide (HYZAAR) 100-12.5 MG tablet Take 1 tablet by mouth daily.     . metoprolol succinate (TOPROL-XL) 25 MG 24 hr tablet Take 25 mg by mouth 2 (two) times daily.  5  .  vitamin C (ASCORBIC ACID) 500 MG tablet Take 500 mg by mouth daily.    Marland Kitchen zinc gluconate 50 MG tablet Take 50 mg by mouth daily.    . pantoprazole (PROTONIX) 40 MG tablet Take 1 tablet (40 mg total) by mouth daily. 30 minutes before breakfast 90 tablet 3   No current facility-administered medications for this visit.     Allergies as of 12/02/2017 - Review Complete 12/02/2017  Allergen Reaction Noted  . Benadryl [diphenhydramine hcl] Hives and Other (See Comments) 10/12/2011  . Erythromycin Hives 02/05/2015  .  Iohexol  06/30/2007  . Latex Rash 06/30/2017  . Penicillins Swelling and Rash 02/13/2013  . Tetracyclines & related Rash     Family History  Problem Relation Age of Onset  . Hypertension Mother   . Diabetes Father   . Heart attack Father        CABG X'3 & stent placement  . Hypertension Father   . Epilepsy Sister   . Supraventricular tachycardia Sister   . Other Sister        "hole in heart"  . Heart attack Maternal Grandfather   . Peripheral Artery Disease Sister   . CAD Sister   . Congestive Heart Failure Sister        mild diastolic heart failure  . Heart disease Son   . Heart disease Son   . Colon cancer Neg Hx     Social History   Socioeconomic History  . Marital status: Married    Spouse name: Not on file  . Number of children: Not on file  . Years of education: Not on file  . Highest education level: Not on file  Occupational History  . Not on file  Social Needs  . Financial resource strain: Not on file  . Food insecurity:    Worry: Not on file    Inability: Not on file  . Transportation needs:    Medical: Not on file    Non-medical: Not on file  Tobacco Use  . Smoking status: Never Smoker  . Smokeless tobacco: Never Used  . Tobacco comment: Never smoked  Substance and Sexual Activity  . Alcohol use: No    Alcohol/week: 0.0 oz  . Drug use: No  . Sexual activity: Not on file  Lifestyle  . Physical activity:    Days per week: Not on file    Minutes per session: Not on file  . Stress: Not on file  Relationships  . Social connections:    Talks on phone: Not on file    Gets together: Not on file    Attends religious service: Not on file    Active member of club or organization: Not on file    Attends meetings of clubs or organizations: Not on file    Relationship status: Not on file  . Intimate partner violence:    Fear of current or ex partner: Not on file    Emotionally abused: Not on file    Physically abused: Not on file    Forced sexual  activity: Not on file  Other Topics Concern  . Not on file  Social History Narrative  . Not on file    Review of Systems: Gen: Denies any fever, chills, fatigue, weight loss, lack of appetite.  CV: see HPI  Resp: Denies shortness of breath at rest or with exertion. Denies wheezing or cough.  GI:see HPI  GU : Denies urinary burning, urinary frequency, urinary hesitancy MS: Denies joint pain,  muscle weakness, cramps, or limitation of movement.  Derm: Denies rash, itching, dry skin Psych: Denies depression, anxiety, memory loss, and confusion Heme: Denies bruising, bleeding, and enlarged lymph nodes.  Physical Exam: BP (!) 175/118   Pulse 76   Temp (!) 97.3 F (36.3 C) (Oral)   Ht 5\' 3"  (1.6 m)   Wt 178 lb 3.2 oz (80.8 kg)   BMI 31.57 kg/m  General:   Alert and oriented. Pleasant and cooperative. Well-nourished and well-developed.  Head:  Normocephalic and atraumatic. Eyes:  Without icterus, sclera clear and conjunctiva pink.  Ears:  Normal auditory acuity. Nose:  No deformity, discharge,  or lesions. Mouth:  No deformity or lesions, oral mucosa pink.  Lungs:  Clear to auscultation bilaterally. No wheezes, rales, or rhonchi. No distress.  Heart:  S1, S2 present without murmurs appreciated.  Abdomen:  +BS, soft, non-tender and non-distended. No HSM noted. No guarding or rebound. No masses appreciated.  Rectal:  Deferred  Msk:  Symmetrical without gross deformities. Normal posture. Extremities:  Without  edema. Neurologic:  Alert and  oriented x4 Psych:  Alert and cooperative. Normal mood and affect.

## 2017-12-02 NOTE — Patient Instructions (Signed)
Start taking Protonix once each morning, 30 minutes before breakfast. I sent this to the pharmacy.  I have ordered an ultrasound of your gallbladder. Sometimes we will do a test called a HIDA scan to evaluate the function (if the ultrasound is negative).  I will talk with Dr. Oneida Alar about the best way to proceed with the endoscopy/colonoscopy.  For constipation: start with adding supplemental fiber to your diet. Increase fluid intake. Please call if this is not helpful.   Further recommendations to follow!  It was a pleasure to see you today. I strive to create trusting relationships with patients to provide genuine, compassionate, and quality care. I value your feedback. If you receive a survey regarding your visit,  I greatly appreciate you taking time to fill this out.   Annitta Needs, PhD, ANP-BC [facility]

## 2017-12-06 ENCOUNTER — Telehealth: Payer: Self-pay | Admitting: Gastroenterology

## 2017-12-06 DIAGNOSIS — R195 Other fecal abnormalities: Secondary | ICD-10-CM

## 2017-12-06 DIAGNOSIS — K5900 Constipation, unspecified: Secondary | ICD-10-CM | POA: Insufficient documentation

## 2017-12-06 DIAGNOSIS — R1310 Dysphagia, unspecified: Secondary | ICD-10-CM

## 2017-12-06 NOTE — Assessment & Plan Note (Signed)
Start Protonix once daily. Sent to pharmacy.

## 2017-12-06 NOTE — Telephone Encounter (Signed)
I discussed with Dr. Oneida Alar. Due to her history of malignant hyperthermia, need to refer to [facility] for TCS/EGD/dilation. Reason: heme positive stool, dysphagia.   We can continue to pursue [place] and possibly HIDA.

## 2017-12-06 NOTE — Assessment & Plan Note (Signed)
Will need diagnostic colonoscopy; however, with her history of malignant hyperthermia, we will need to refer to [facility] after discussion with Dr. Oneida Alar.

## 2017-12-06 NOTE — Assessment & Plan Note (Signed)
Likely moreso related to uncontrolled GERD. History of EGD in 2016. Will need EGD/dilation at time of colonoscopy. Referring to [facility] due to history of malignant hyperthermia.

## 2017-12-06 NOTE — Assessment & Plan Note (Signed)
Proceed with [place] RUQ. May need HIDA.

## 2017-12-06 NOTE — Assessment & Plan Note (Signed)
History of IBS-D, now with constipation. Empirically treated for diverticulitis recently but without any imaging. Likely abdominal pain secondary to IBS-C. Patient to call if worsening. For now, add supplemental fiber, dietary changes, increase water intake as constipation is mild. May need low dose Linzess or Amitiza in future.

## 2017-12-07 ENCOUNTER — Telehealth: Payer: Self-pay | Admitting: Gastroenterology

## 2017-12-07 NOTE — Progress Notes (Signed)
CC'D TO PCP °

## 2017-12-07 NOTE — Telephone Encounter (Signed)
PT is aware. Ok to refer to [place] [facility].

## 2017-12-07 NOTE — Telephone Encounter (Signed)
See previous note

## 2017-12-07 NOTE — Telephone Encounter (Signed)
Pt was returning a call from DS. Please call her back at [phone]

## 2017-12-07 NOTE — Telephone Encounter (Signed)
Tried to call and mail box is full.  

## 2017-12-07 NOTE — Telephone Encounter (Signed)
Referral has been faxed.

## 2017-12-08 ENCOUNTER — Ambulatory Visit (HOSPITAL_COMMUNITY)
Admission: RE | Admit: 2017-12-08 | Discharge: 2017-12-08 | Disposition: A | Discharge status: Home / Self Care | Payer: BLUE CROSS/BLUE SHIELD | Source: Ambulatory Visit | Attending: Gastroenterology | Admitting: Gastroenterology

## 2017-12-08 DIAGNOSIS — R1011 Right upper quadrant pain: Secondary | ICD-10-CM

## 2017-12-08 DIAGNOSIS — K760 Fatty (change of) liver, not elsewhere classified: Secondary | ICD-10-CM | POA: Diagnosis not present

## 2017-12-14 NOTE — Progress Notes (Signed)
Pt said she is not constipated now. She has diarrhea every morning 2-4 times in the Am and it levels off about noon. She has not heard from [facility] yet, so does not know when her procedures will be scheduled.

## 2017-12-14 NOTE — Progress Notes (Signed)
Pt said the pain she has is not just after eating. She has some pain at times on her left side and right side.  But she felt it was coming from her recent pancreatitis, diverticulitis and internal shingles that she had.  Vicente Males, please advise!

## 2017-12-14 NOTE — Progress Notes (Signed)
No gallstones on ultrasound. Fatty liver mild. Recent LFTs normal. Please let patient know. If persistent RUQ pain with eating, could pursue HIDA.

## 2017-12-17 NOTE — Progress Notes (Signed)
Letter mailed to pt.  

## 2017-12-17 NOTE — Progress Notes (Signed)
Tried to call, mailbox full and could not leave a message.

## 2018-01-17 DIAGNOSIS — K859 Acute pancreatitis without necrosis or infection, unspecified: Secondary | ICD-10-CM | POA: Insufficient documentation

## 2018-01-17 DIAGNOSIS — J449 Chronic obstructive pulmonary disease, unspecified: Secondary | ICD-10-CM | POA: Insufficient documentation

## 2018-01-17 DIAGNOSIS — G4733 Obstructive sleep apnea (adult) (pediatric): Secondary | ICD-10-CM | POA: Insufficient documentation

## 2018-01-17 DIAGNOSIS — J45909 Unspecified asthma, uncomplicated: Secondary | ICD-10-CM | POA: Insufficient documentation

## 2018-02-03 ENCOUNTER — Telehealth: Payer: Self-pay | Admitting: Gastroenterology

## 2018-02-03 NOTE — Telephone Encounter (Signed)
Spoke with pt and she had a procedure at [facility] and was inquiring about the results. Pt was advised to call [facility] to see if they have released the results. Results haven't been sent to our office yet.

## 2018-02-03 NOTE — Telephone Encounter (Signed)
Pt called to see if her results from her procedure was back yet. [phone]

## 2018-02-09 ENCOUNTER — Other Ambulatory Visit (HOSPITAL_COMMUNITY): Payer: Self-pay | Admitting: Neurology

## 2018-02-09 DIAGNOSIS — R269 Unspecified abnormalities of gait and mobility: Secondary | ICD-10-CM

## 2018-02-18 ENCOUNTER — Ambulatory Visit (HOSPITAL_COMMUNITY): Payer: BLUE CROSS/BLUE SHIELD

## 2018-02-25 ENCOUNTER — Ambulatory Visit (HOSPITAL_COMMUNITY)
Admission: RE | Admit: 2018-02-25 | Discharge: 2018-02-25 | Disposition: A | Discharge status: Home / Self Care | Payer: BLUE CROSS/BLUE SHIELD | Source: Ambulatory Visit | Attending: Neurology | Admitting: Neurology

## 2018-02-25 DIAGNOSIS — R269 Unspecified abnormalities of gait and mobility: Secondary | ICD-10-CM | POA: Diagnosis present

## 2018-02-25 DIAGNOSIS — R296 Repeated falls: Secondary | ICD-10-CM | POA: Insufficient documentation

## 2018-02-25 DIAGNOSIS — G4459 Other complicated headache syndrome: Secondary | ICD-10-CM | POA: Insufficient documentation

## 2018-02-25 DIAGNOSIS — R42 Dizziness and giddiness: Secondary | ICD-10-CM | POA: Diagnosis not present

## 2019-07-28 ENCOUNTER — Other Ambulatory Visit: Payer: Self-pay

## 2019-07-28 NOTE — Progress Notes (Unsigned)
Phoned patient to enroll in BCCCP, and to prescreen for 07/1919 visit. Left message;  Screened patient for BCCCP eligibility using two patient identifiers.  Patient states she is having left breast pain at 3:oclock with swelling. Moved appointment to 12:30 on 08/09/19 at 12:30.  Patient to go directly to [facility] for diagnostic mammogram, and ultrasound.  Orders are in.

## 2019-08-01 ENCOUNTER — Ambulatory Visit: Payer: Self-pay

## 2019-08-09 ENCOUNTER — Ambulatory Visit
Admission: RE | Admit: 2019-08-09 | Discharge: 2019-08-09 | Disposition: A | Discharge status: Home / Self Care | Payer: Self-pay | Source: Ambulatory Visit | Attending: Oncology | Admitting: Oncology

## 2019-08-09 ENCOUNTER — Ambulatory Visit: Payer: Self-pay | Attending: Oncology

## 2019-08-09 DIAGNOSIS — N644 Mastodynia: Secondary | ICD-10-CM | POA: Insufficient documentation

## 2019-08-09 NOTE — Progress Notes (Signed)
Patient pre-screened for BCCCP eligibility due to COVID 19 precautions. Two patient identifiers confirmed correct patient.   Patient complains of targeted left breast pain and swelling.   Presented to [facility] today for BCCCP diagnostic mammogram. Jeanella Anton Spoke with patient to ensure outside films were brought to mammogram visit.   Risk Assessment    No risk assessment data for the current encounter   Risk Scores      08/01/2019   Last edited by: Theodore Demark, RN   5-year risk: 1.8 %   Lifetime risk: 10.2 %

## 2019-08-25 NOTE — Progress Notes (Signed)
Letter mailed from [facility] to notify of normal mammogram results.  Patient to return in one year for annual screening.  Copy to [facility]. 

## 2020-02-06 ENCOUNTER — Ambulatory Visit
Admission: EM | Admit: 2020-02-06 | Discharge: 2020-02-06 | Disposition: A | Discharge status: Home / Self Care | Payer: 59 | Attending: Family Medicine | Admitting: Family Medicine

## 2020-02-06 ENCOUNTER — Encounter: Payer: Self-pay | Admitting: Emergency Medicine

## 2020-02-06 DIAGNOSIS — Z23 Encounter for immunization: Secondary | ICD-10-CM

## 2020-02-06 DIAGNOSIS — M79602 Pain in left arm: Secondary | ICD-10-CM

## 2020-02-06 DIAGNOSIS — W540XXA Bitten by dog, initial encounter: Secondary | ICD-10-CM

## 2020-02-06 MED ORDER — AMOXICILLIN-POT CLAVULANATE 875-125 MG PO TABS: 1.0000 | ORAL_TABLET | Freq: Two times a day (BID) | ORAL | 0 refills | Status: DC

## 2020-02-06 MED ORDER — TETANUS-DIPHTH-ACELL PERTUSSIS 5-2.5-18.5 LF-MCG/0.5 IM SUSP
0.5000 mL | Freq: Once | INTRAMUSCULAR | Status: AC
  Administered 2020-02-06: 0.5 mL via INTRAMUSCULAR

## 2020-02-06 NOTE — ED Triage Notes (Signed)
Dog bite to LT lower arm. Dog is up to date on rabies vaccine.  Pt states she needs a tetanus shot.

## 2020-02-06 NOTE — Discharge Instructions (Addendum)
Take the antibiotics as prescribed  Keep the area clean and dry Tetanus updated today Follow up as needed for continued or worsening symptoms

## 2020-02-07 NOTE — ED Provider Notes (Signed)
[place]    CSN: [id number] Arrival date & time: 02/06/20  1328      History   Chief Complaint Chief Complaint  Patient presents with  . Animal Bite    HPI Vanessa Mcconnell is a 59 y.o. female.   Patient is a 59 year old female presents today with dog bite to left forearm.  This occurred earlier prior to arrival.  Reporting this is  a family dog and dog is up to date on rabies vaccine.  Mild tenderness to forearm.  Bleeding controlled.  No numbness, tingling or loss of sensation. She cleaned the wound.   ROS per HPI      Past Medical History:  Diagnosis Date  . Arthritis   . Asthma   . Essential hypertension   . Fibromyalgia   . History of migraine   . Hyperlipidemia   . Malignant hyperthermia    Reportedly at age 14 with tonsillectomy  . Pancreatitis    Reportedly single episode in childhood  . Small intestinal bacterial overgrowth 12/2015   Positive breath test  . Thoracic disc herniation     Patient Active Problem List   Diagnosis Date Noted  . Constipation 12/06/2017  . GERD (gastroesophageal reflux disease) 12/02/2017  . RUQ pain 12/02/2017  . Heme positive stool 12/02/2017  . Chest pain 08/26/2017  . Essential hypertension 08/26/2017  . Hyperlipidemia 08/26/2017  . Hypokalemia 08/26/2017  . Small intestinal bacterial overgrowth 12/26/2015  . Dysphagia 12/26/2015  . Nausea without vomiting 11/04/2015  . Elevated blood pressure 05/08/2015  . Bloating 02/05/2015  . Nausea with vomiting 02/05/2015  . Hematochezia 02/05/2015    Past Surgical History:  Procedure Laterality Date  . ABDOMINAL HYSTERECTOMY    . BREAST BIOPSY Left 1990's   3 areas biopsied at different times, all with needle not excisional  . COLONOSCOPY N/A 02/11/2015   [id number] diverticulosis in the sigmoid colon/moderate internal hemorrhoids  . DEBRIDEMENT LEG     cellulitis debridement as a teen  . ESOPHAGEAL DILATION  02/11/2015   Procedure: ESOPHAGEAL  DILATION;  Surgeon: Danie Binder, MD;  Location: [facility];  Service: Endoscopy;;  . ESOPHAGOGASTRODUODENOSCOPY N/A 02/11/2015   [id number] size HH/mild non-erosive gastritis  . strep infection     causing skin infection, debridement buttocks, hip, legs  . TONSILLECTOMY    . ULNER NERVE SURGERY     Right Elbow    OB History   No obstetric history on file.      Home Medications    Prior to Admission medications   Medication Sig Start Date End Date Taking? Authorizing Provider  albuterol (PROVENTIL HFA;VENTOLIN HFA) 108 (90 Base) MCG/ACT inhaler Inhale into the lungs every 6 (six) hours as needed for wheezing or shortness of breath.    [provider]  amLODipine (NORVASC) 5 MG tablet Take 5 mg by mouth every evening.  06/24/17   [provider]  amoxicillin-clavulanate (AUGMENTIN) 875-125 MG tablet Take 1 tablet by mouth every 12 (twelve) hours. 02/06/20   Loura Halt A, NP  gabapentin (NEURONTIN) 300 MG capsule Take 300 mg by mouth 3 (three) times daily.    [provider]  losartan-hydrochlorothiazide (HYZAAR) 100-12.5 MG tablet Take 1 tablet by mouth daily.  10/30/15   [provider]  metoprolol succinate (TOPROL-XL) 25 MG 24 hr tablet Take 25 mg by mouth 2 (two) times daily. 06/14/17   [provider]  pantoprazole (PROTONIX) 40 MG tablet Take 1 tablet (40 mg total) by  mouth daily. 30 minutes before breakfast 12/02/17   Annitta Needs, NP  vitamin C (ASCORBIC ACID) 500 MG tablet Take 500 mg by mouth daily.    [provider]  zinc gluconate 50 MG tablet Take 50 mg by mouth daily.    [provider]    Family History Family History  Problem Relation Age of Onset  . Hypertension Mother   . Diabetes Father   . Heart attack Father        CABG X'3 & stent placement  . Hypertension Father   . Epilepsy Sister   . Supraventricular tachycardia Sister   . Other Sister        "hole in heart"  . Heart attack Maternal  Grandfather   . Peripheral Artery Disease Sister   . CAD Sister   . Congestive Heart Failure Sister        mild diastolic heart failure  . Heart disease Son   . Heart disease Son   . Colon cancer Neg Hx   . Breast cancer Neg Hx     Social History Social History   Tobacco Use  . Smoking status: Never Smoker  . Smokeless tobacco: Never Used  . Tobacco comment: Never smoked  Vaping Use  . Vaping Use: Never used  Substance Use Topics  . Alcohol use: No    Alcohol/week: 0.0 standard drinks  . Drug use: No     Allergies   Benadryl [diphenhydramine hcl], Erythromycin, Iohexol, Latex, Penicillins, and Tetracyclines & related   Review of Systems Review of Systems   Physical Exam Triage Vital Signs ED Triage Vitals  Enc Vitals Group     BP 02/06/20 1342 (!) 161/106     Pulse Rate 02/06/20 1342 75     Resp 02/06/20 1342 18     Temp 02/06/20 1342 98.3 F (36.8 C)     Temp Source 02/06/20 1342 Oral     SpO2 02/06/20 1342 96 %     Weight 02/06/20 1340 176 lb 5.9 oz (80 kg)     Height 02/06/20 1340 5\' 3"  (1.6 m)     Head Circumference --      Peak Flow --      Pain Score 02/06/20 1340 0     Pain Loc --      Pain Edu? --      Excl. in [place]? --    No data found.  Updated Vital Signs BP (!) 161/106 (BP Location: Right Arm)   Pulse 75   Temp 98.3 F (36.8 C) (Oral)   Resp 18   Ht 5\' 3"  (1.6 m)   Wt 176 lb 5.9 oz (80 kg)   SpO2 96%   BMI 31.24 kg/m   Visual Acuity Right Eye Distance:   Left Eye Distance:   Bilateral Distance:    Right Eye Near:   Left Eye Near:    Bilateral Near:     Physical Exam Vitals and nursing note reviewed.  Constitutional:      General: She is not in acute distress.    Appearance: Normal appearance. She is not ill-appearing, toxic-appearing or diaphoretic.  HENT:     Head: Normocephalic.     Nose: Nose normal.  Eyes:     Conjunctiva/sclera: Conjunctivae normal.  Pulmonary:     Effort: Pulmonary effort is normal.    Musculoskeletal:        General: Normal range of motion.     Cervical back: Normal range of motion.  Skin:    General: Skin is warm and dry.     Findings: Signs of injury and wound present. No rash.          Comments: Bite to left posterior forearm. No bleeding. TTP  Neurological:     Mental Status: She is alert.  Psychiatric:        Mood and Affect: Mood normal.      UC Treatments / Results  Labs (all labs ordered are listed, but only abnormal results are displayed) Labs Reviewed - No data to display  EKG   Radiology No results found.  Procedures Procedures (including critical care time)  Medications Ordered in UC Medications  Tdap (BOOSTRIX) injection 0.5 mL (0.5 mLs Intramuscular Given 02/06/20 1401)    Initial Impression / Assessment and Plan / UC Course  I have reviewed the triage vital signs and the nursing notes.  Pertinent labs & imaging results that were available during my care of the patient were reviewed by me and considered in my medical decision making (see chart for details).     Dog bite Cleaned wound and wrapped in clinic. Placing on antibiotics prophylactic ally Tetanus updated Follow up as needed for continued or worsening symptoms  Final Clinical Impressions(s) / UC Diagnoses   Final diagnoses:  Pain of left upper extremity  Dog bite, initial encounter     Discharge Instructions     Take the antibiotics as prescribed  Keep the area clean and dry Tetanus updated today Follow up as needed for continued or worsening symptoms     ED Prescriptions    Medication Sig Dispense Auth. Provider   amoxicillin-clavulanate (AUGMENTIN) 875-125 MG tablet Take 1 tablet by mouth every 12 (twelve) hours. 14 tablet Emett Stapel A, NP     PDMP not reviewed this encounter.   Loura Halt A, NP 02/07/20 [phone]

## 2020-02-29 DIAGNOSIS — Z299 Encounter for prophylactic measures, unspecified: Secondary | ICD-10-CM | POA: Diagnosis not present

## 2020-02-29 DIAGNOSIS — Z789 Other specified health status: Secondary | ICD-10-CM | POA: Diagnosis not present

## 2020-02-29 DIAGNOSIS — I10 Essential (primary) hypertension: Secondary | ICD-10-CM | POA: Diagnosis not present

## 2020-02-29 DIAGNOSIS — J449 Chronic obstructive pulmonary disease, unspecified: Secondary | ICD-10-CM | POA: Diagnosis not present

## 2020-02-29 DIAGNOSIS — H8309 Labyrinthitis, unspecified ear: Secondary | ICD-10-CM | POA: Diagnosis not present

## 2020-03-20 DIAGNOSIS — Z299 Encounter for prophylactic measures, unspecified: Secondary | ICD-10-CM | POA: Diagnosis not present

## 2020-03-20 DIAGNOSIS — Z789 Other specified health status: Secondary | ICD-10-CM | POA: Diagnosis not present

## 2020-03-20 DIAGNOSIS — I10 Essential (primary) hypertension: Secondary | ICD-10-CM | POA: Diagnosis not present

## 2020-03-20 DIAGNOSIS — H6592 Unspecified nonsuppurative otitis media, left ear: Secondary | ICD-10-CM | POA: Diagnosis not present

## 2020-06-12 DIAGNOSIS — J449 Chronic obstructive pulmonary disease, unspecified: Secondary | ICD-10-CM | POA: Diagnosis not present

## 2020-06-12 DIAGNOSIS — Z2821 Immunization not carried out because of patient refusal: Secondary | ICD-10-CM | POA: Diagnosis not present

## 2020-06-12 DIAGNOSIS — I10 Essential (primary) hypertension: Secondary | ICD-10-CM | POA: Diagnosis not present

## 2020-06-12 DIAGNOSIS — Z789 Other specified health status: Secondary | ICD-10-CM | POA: Diagnosis not present

## 2020-06-12 DIAGNOSIS — Z299 Encounter for prophylactic measures, unspecified: Secondary | ICD-10-CM | POA: Diagnosis not present

## 2020-06-12 DIAGNOSIS — H6592 Unspecified nonsuppurative otitis media, left ear: Secondary | ICD-10-CM | POA: Diagnosis not present

## 2020-06-12 DIAGNOSIS — J45909 Unspecified asthma, uncomplicated: Secondary | ICD-10-CM | POA: Diagnosis not present

## 2020-08-22 DIAGNOSIS — N390 Urinary tract infection, site not specified: Secondary | ICD-10-CM | POA: Diagnosis not present

## 2020-08-22 DIAGNOSIS — Z299 Encounter for prophylactic measures, unspecified: Secondary | ICD-10-CM | POA: Diagnosis not present

## 2020-08-22 DIAGNOSIS — Z789 Other specified health status: Secondary | ICD-10-CM | POA: Diagnosis not present

## 2020-08-22 DIAGNOSIS — R350 Frequency of micturition: Secondary | ICD-10-CM | POA: Diagnosis not present

## 2020-08-22 DIAGNOSIS — I10 Essential (primary) hypertension: Secondary | ICD-10-CM | POA: Diagnosis not present

## 2020-08-22 DIAGNOSIS — J449 Chronic obstructive pulmonary disease, unspecified: Secondary | ICD-10-CM | POA: Diagnosis not present

## 2020-08-23 DIAGNOSIS — R42 Dizziness and giddiness: Secondary | ICD-10-CM | POA: Diagnosis not present

## 2020-08-23 DIAGNOSIS — R278 Other lack of coordination: Secondary | ICD-10-CM | POA: Diagnosis not present

## 2020-08-23 DIAGNOSIS — H9313 Tinnitus, bilateral: Secondary | ICD-10-CM | POA: Diagnosis not present

## 2020-08-23 DIAGNOSIS — I10 Essential (primary) hypertension: Secondary | ICD-10-CM | POA: Diagnosis not present

## 2020-08-29 ENCOUNTER — Encounter: Payer: Self-pay | Admitting: Neurology

## 2020-09-12 ENCOUNTER — Other Ambulatory Visit: Payer: Self-pay

## 2020-09-12 ENCOUNTER — Other Ambulatory Visit: Payer: Self-pay | Admitting: Neurology

## 2020-09-12 ENCOUNTER — Ambulatory Visit (HOSPITAL_COMMUNITY)
Admission: RE | Admit: 2020-09-12 | Discharge: 2020-09-12 | Disposition: A | Discharge status: Home / Self Care | Payer: Medicare Other | Source: Ambulatory Visit | Attending: Neurology | Admitting: Neurology

## 2020-09-12 ENCOUNTER — Other Ambulatory Visit (HOSPITAL_COMMUNITY): Payer: Self-pay | Admitting: Neurology

## 2020-09-12 DIAGNOSIS — G35 Multiple sclerosis: Secondary | ICD-10-CM

## 2020-09-12 DIAGNOSIS — M50222 Other cervical disc displacement at C5-C6 level: Secondary | ICD-10-CM | POA: Diagnosis not present

## 2020-09-12 DIAGNOSIS — M4802 Spinal stenosis, cervical region: Secondary | ICD-10-CM | POA: Diagnosis not present

## 2020-09-12 DIAGNOSIS — R110 Nausea: Secondary | ICD-10-CM | POA: Diagnosis not present

## 2020-09-12 MED ORDER — GADOBUTROL 1 MMOL/ML IV SOLN
8.0000 mL | Freq: Once | INTRAVENOUS | Status: AC | PRN
  Administered 2020-09-12: 8 mL via INTRAVENOUS

## 2020-09-18 DIAGNOSIS — R29818 Other symptoms and signs involving the nervous system: Secondary | ICD-10-CM | POA: Insufficient documentation

## 2020-09-18 DIAGNOSIS — R42 Dizziness and giddiness: Secondary | ICD-10-CM | POA: Insufficient documentation

## 2020-09-18 DIAGNOSIS — H9313 Tinnitus, bilateral: Secondary | ICD-10-CM | POA: Insufficient documentation

## 2020-09-20 DIAGNOSIS — R109 Unspecified abdominal pain: Secondary | ICD-10-CM | POA: Diagnosis not present

## 2020-09-20 DIAGNOSIS — G35 Multiple sclerosis: Secondary | ICD-10-CM | POA: Diagnosis not present

## 2020-09-20 DIAGNOSIS — Z2821 Immunization not carried out because of patient refusal: Secondary | ICD-10-CM | POA: Diagnosis not present

## 2020-09-20 DIAGNOSIS — K5792 Diverticulitis of intestine, part unspecified, without perforation or abscess without bleeding: Secondary | ICD-10-CM | POA: Diagnosis not present

## 2020-09-20 DIAGNOSIS — Z789 Other specified health status: Secondary | ICD-10-CM | POA: Diagnosis not present

## 2020-09-20 DIAGNOSIS — Z20822 Contact with and (suspected) exposure to covid-19: Secondary | ICD-10-CM | POA: Diagnosis not present

## 2020-09-20 DIAGNOSIS — Z299 Encounter for prophylactic measures, unspecified: Secondary | ICD-10-CM | POA: Diagnosis not present

## 2020-09-27 ENCOUNTER — Other Ambulatory Visit: Payer: Self-pay | Admitting: Otolaryngology

## 2020-09-27 ENCOUNTER — Other Ambulatory Visit (HOSPITAL_COMMUNITY): Payer: Self-pay | Admitting: Otolaryngology

## 2020-09-27 DIAGNOSIS — H903 Sensorineural hearing loss, bilateral: Secondary | ICD-10-CM | POA: Diagnosis not present

## 2020-09-27 DIAGNOSIS — H9313 Tinnitus, bilateral: Secondary | ICD-10-CM | POA: Diagnosis not present

## 2020-09-27 DIAGNOSIS — Z0000 Encounter for general adult medical examination without abnormal findings: Secondary | ICD-10-CM | POA: Diagnosis not present

## 2020-09-27 DIAGNOSIS — H93A9 Pulsatile tinnitus, unspecified ear: Secondary | ICD-10-CM

## 2020-09-27 DIAGNOSIS — R5383 Other fatigue: Secondary | ICD-10-CM | POA: Diagnosis not present

## 2020-09-27 DIAGNOSIS — I10 Essential (primary) hypertension: Secondary | ICD-10-CM | POA: Diagnosis not present

## 2020-09-27 DIAGNOSIS — Z789 Other specified health status: Secondary | ICD-10-CM | POA: Diagnosis not present

## 2020-09-27 DIAGNOSIS — Z79899 Other long term (current) drug therapy: Secondary | ICD-10-CM | POA: Diagnosis not present

## 2020-09-27 DIAGNOSIS — Z299 Encounter for prophylactic measures, unspecified: Secondary | ICD-10-CM | POA: Diagnosis not present

## 2020-09-27 DIAGNOSIS — Z7189 Other specified counseling: Secondary | ICD-10-CM | POA: Diagnosis not present

## 2020-09-27 DIAGNOSIS — H6983 Other specified disorders of Eustachian tube, bilateral: Secondary | ICD-10-CM | POA: Diagnosis not present

## 2020-09-27 DIAGNOSIS — E7800 Pure hypercholesterolemia, unspecified: Secondary | ICD-10-CM | POA: Diagnosis not present

## 2020-10-04 DIAGNOSIS — G35 Multiple sclerosis: Secondary | ICD-10-CM | POA: Diagnosis not present

## 2020-10-04 DIAGNOSIS — I10 Essential (primary) hypertension: Secondary | ICD-10-CM | POA: Diagnosis not present

## 2020-10-04 DIAGNOSIS — R269 Unspecified abnormalities of gait and mobility: Secondary | ICD-10-CM | POA: Diagnosis not present

## 2020-10-04 DIAGNOSIS — M545 Low back pain, unspecified: Secondary | ICD-10-CM | POA: Diagnosis not present

## 2020-10-04 DIAGNOSIS — R569 Unspecified convulsions: Secondary | ICD-10-CM | POA: Diagnosis not present

## 2020-10-04 DIAGNOSIS — R278 Other lack of coordination: Secondary | ICD-10-CM | POA: Diagnosis not present

## 2020-10-08 DIAGNOSIS — E7800 Pure hypercholesterolemia, unspecified: Secondary | ICD-10-CM | POA: Diagnosis not present

## 2020-10-08 DIAGNOSIS — Z299 Encounter for prophylactic measures, unspecified: Secondary | ICD-10-CM | POA: Diagnosis not present

## 2020-10-08 DIAGNOSIS — G35 Multiple sclerosis: Secondary | ICD-10-CM | POA: Diagnosis not present

## 2020-10-08 DIAGNOSIS — J45909 Unspecified asthma, uncomplicated: Secondary | ICD-10-CM | POA: Diagnosis not present

## 2020-10-08 DIAGNOSIS — I10 Essential (primary) hypertension: Secondary | ICD-10-CM | POA: Diagnosis not present

## 2020-10-09 DIAGNOSIS — M5124 Other intervertebral disc displacement, thoracic region: Secondary | ICD-10-CM | POA: Diagnosis not present

## 2020-10-09 DIAGNOSIS — E7800 Pure hypercholesterolemia, unspecified: Secondary | ICD-10-CM | POA: Diagnosis not present

## 2020-10-09 DIAGNOSIS — N183 Chronic kidney disease, stage 3 unspecified: Secondary | ICD-10-CM | POA: Diagnosis not present

## 2020-10-09 DIAGNOSIS — K219 Gastro-esophageal reflux disease without esophagitis: Secondary | ICD-10-CM | POA: Diagnosis not present

## 2020-10-18 ENCOUNTER — Other Ambulatory Visit (HOSPITAL_COMMUNITY): Payer: Self-pay | Admitting: Otolaryngology

## 2020-10-18 DIAGNOSIS — H93A9 Pulsatile tinnitus, unspecified ear: Secondary | ICD-10-CM

## 2020-10-23 ENCOUNTER — Encounter (HOSPITAL_COMMUNITY): Payer: Self-pay

## 2020-10-23 ENCOUNTER — Ambulatory Visit (HOSPITAL_COMMUNITY): Admission: RE | Admit: 2020-10-23 | Payer: Medicare Other | Source: Ambulatory Visit

## 2020-11-04 ENCOUNTER — Other Ambulatory Visit: Payer: Self-pay

## 2020-11-04 ENCOUNTER — Ambulatory Visit: Payer: 59 | Admitting: Neurology

## 2020-11-04 ENCOUNTER — Ambulatory Visit (HOSPITAL_COMMUNITY)
Admission: RE | Admit: 2020-11-04 | Discharge: 2020-11-04 | Disposition: A | Discharge status: Home / Self Care | Payer: Medicare Other | Source: Ambulatory Visit | Attending: Otolaryngology | Admitting: Otolaryngology

## 2020-11-04 DIAGNOSIS — H93A9 Pulsatile tinnitus, unspecified ear: Secondary | ICD-10-CM | POA: Insufficient documentation

## 2020-11-04 DIAGNOSIS — R531 Weakness: Secondary | ICD-10-CM | POA: Diagnosis not present

## 2020-11-05 ENCOUNTER — Telehealth: Payer: Self-pay

## 2020-11-05 NOTE — Telephone Encounter (Signed)
Telephone call to [facility] no answer. LVM for Vanessa Mcconnell to call us back or fax over last ov notes.

## 2020-11-07 ENCOUNTER — Other Ambulatory Visit (INDEPENDENT_AMBULATORY_CARE_PROVIDER_SITE_OTHER): Payer: Medicare Other

## 2020-11-07 ENCOUNTER — Encounter: Payer: Self-pay | Admitting: Neurology

## 2020-11-07 ENCOUNTER — Ambulatory Visit: Payer: Medicare Other | Admitting: Neurology

## 2020-11-07 ENCOUNTER — Other Ambulatory Visit: Payer: Self-pay

## 2020-11-07 VITALS — BP 127/79 | HR 82 | Ht 63.0 in | Wt 180.8 lb

## 2020-11-07 DIAGNOSIS — R2681 Unsteadiness on feet: Secondary | ICD-10-CM

## 2020-11-07 DIAGNOSIS — H93A9 Pulsatile tinnitus, unspecified ear: Secondary | ICD-10-CM

## 2020-11-07 DIAGNOSIS — R9082 White matter disease, unspecified: Secondary | ICD-10-CM

## 2020-11-07 DIAGNOSIS — R531 Weakness: Secondary | ICD-10-CM | POA: Diagnosis not present

## 2020-11-07 NOTE — Progress Notes (Addendum)
NEUROLOGY CONSULTATION NOTE  Vanessa Mcconnell MRN: [id number] DOB: [birth year]  Referring provider: Jerene Bears, MD Primary care provider: Jerene Bears  Reason for consult:  Spinal cord lesion  Assessment/Plan:   1.  White matter abnormalities on MRI brain - findings are nonspecific and I cannot therefore confidently say that they represent multiple sclerosis.  Between 2019 and 2022 there is very mild progression but not significant.  Cervical spinal cord does not demonstrate demyelinating lesions.   2.  Pulsatile tinnitus.  MRA negative for intracranial vascular abnormality.   1.  I would like to check MRI of thoracic spine with and without contrast to evaluate for any possible MS lesions 2.  If unremarkable, would consider LP 3.  For further evaluation of pulsatile tinnitus, check TSH and carotid ultrasound.   Subjective:  Vanessa Mcconnell is a 60 year old right-handed female who presents for multiple sclerosis.  In 2018, she started having frequent falls, dropping objects, pain squeezing around her torso.  It lasted a month and resolved.  It recurred again in 2019.  MRI of brain without contrast on 02/25/2018 showed numerous nonspecific scattered hyperintense foci in the bilateral cerebral white matter.  She saw neurology who recommended repeating MRI in 6 months.  However, she never followed up because she was focused on caring for her elderly parents.  Symptoms subsequently resolved.  She started having balance problems in July 2021.  In January, she started having worsening balance with falls.  Sometimes she drags her foot.  She endorsed blurred vision in her right eye, stabbing pain in her left eye and pulsatile tinnitus in her left ear.  She began having episodes of brief blacking out of vision when she would would be reading or scanning her phone with her eyes.  Sometimes while driving, she may zone out and suddenly find herself briefly disoriented of her surroundings.  Sometimes  she would "jump" in jer sleep.  She returned to the neurologist.  She had an EEG which reportedly showed abnormalities concerning for seizures and she was started on Keppra (ADDENDUM:  EEG showed "occasional bitemporal slowing more prominent on the left side of unclear significance.  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  She still has spells but they have minimized.  She started having the "hug" pain again.   She started feeling nauseous as well.  MRI of brain with and without contrast on 09/12/2020 again showed numerous scattered hyperintense foci in the bilateral cerebral white matter mildly progressed compared to 2019.  MRI of cervical spine with and without contrast showed mild disc protrusions but normal spinal cord without evidence of demyelination.  MRA/MRV of head on 11/04/2020 were normal.     PAST MEDICAL HISTORY: Past Medical History:  Diagnosis Date  . Arthritis   . Asthma   . Essential hypertension   . Fibromyalgia   . History of migraine   . Hyperlipidemia   . 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 Pancreatitis    Reportedly single episode in childhood  . Small intestinal bacterial overgrowth 12/2015   Positive breath test  . Thoracic disc herniation     PAST SURGICAL HISTORY: Past Surgical History:  Procedure Laterality Date  . ABDOMINAL HYSTERECTOMY    . BREAST BIOPSY Left 1990's   3 areas biopsied at different times, all with needle not excisional  . COLONOSCOPY N/A 02/11/2015  JQB:HALPFXTK diverticulosis in the sigmoid colon/moderate internal hemorrhoids  . DEBRIDEMENT LEG     cellulitis debridement as a teen  . ESOPHAGEAL DILATION  02/11/2015   Procedure: ESOPHAGEAL DILATION;  Surgeon: Danie Binder, MD;  Location: [facility];  Service: Endoscopy;;  . ESOPHAGOGASTRODUODENOSCOPY N/A 02/11/2015   WIO:[id number] size HH/mild non-erosive gastritis  . strep  infection     causing skin infection, debridement buttocks, hip, legs  . TONSILLECTOMY    . ULNER NERVE SURGERY     Right Elbow    MEDICATIONS: Current Outpatient Medications on File Prior to Visit  Medication Sig Dispense Refill  . albuterol (PROVENTIL HFA;VENTOLIN HFA) 108 (90 Base) MCG/ACT inhaler Inhale into the lungs every 6 (six) hours as needed for wheezing or shortness of breath.    Marland Kitchen amLODipine (NORVASC) 5 MG tablet Take 5 mg by mouth every evening.   0  . amoxicillin-clavulanate (AUGMENTIN) 875-125 MG tablet Take 1 tablet by mouth every 12 (twelve) hours. 14 tablet 0  . gabapentin (NEURONTIN) 300 MG capsule Take 300 mg by mouth 3 (three) times daily.    Marland Kitchen losartan-hydrochlorothiazide (HYZAAR) 100-12.5 MG tablet Take 1 tablet by mouth daily.     . metoprolol succinate (TOPROL-XL) 25 MG 24 hr tablet Take 25 mg by mouth 2 (two) times daily.  5  . pantoprazole (PROTONIX) 40 MG tablet Take 1 tablet (40 mg total) by mouth daily. 30 minutes before breakfast 90 tablet 3  . vitamin C (ASCORBIC ACID) 500 MG tablet Take 500 mg by mouth daily.    Marland Kitchen zinc gluconate 50 MG tablet Take 50 mg by mouth daily.     No current facility-administered medications on file prior to visit.    ALLERGIES: Allergies  Allergen Reactions  . Benadryl [Diphenhydramine Hcl] Hives and Other (See Comments)    Passed out  . Erythromycin Hives  . Iohexol      Code: HIVES, Desc: Anaphylaxis/Hives, Onset Date: [id number]   . Latex Rash  . Penicillins Swelling and Rash    Has patient had a PCN reaction causing immediate rash, facial/tongue/throat swelling, SOB or lightheadedness with hypotension: Yes Has patient had a PCN reaction causing severe rash involving mucus membranes or skin necrosis: No Has patient had a PCN reaction that required hospitalization: No Has patient had a PCN reaction occurring within the last 10 years: No If all of the above answers are "NO", then may proceed with Cephalosporin use.  .  Tetracyclines & Related Rash    FAMILY HISTORY: Family History  Problem Relation Age of Onset  . Hypertension Mother   . Diabetes Father   . Heart attack Father        CABG X'3 & stent placement  . Hypertension Father   . Epilepsy Sister   . Supraventricular tachycardia Sister   . Other Sister        "hole in heart"  . Heart attack Maternal Grandfather   . Peripheral Artery Disease Sister   . CAD Sister   . Congestive Heart Failure Sister        mild diastolic heart failure  . Heart disease Son   . Heart disease Son   . Colon cancer Neg Hx   . Breast cancer Neg Hx     Objective:  Blood pressure 127/79, pulse 82, height 5\' 3"  (1.6 m), weight 180 lb 12.8 oz (82 kg), SpO2 96 %. General: No acute distress.  Patient appears well-groomed.   Head:  Normocephalic/atraumatic Eyes:  fundi examined  but not visualized Neck: supple, no paraspinal tenderness, full range of motion Back: No paraspinal tenderness Heart: regular rate and rhythm Lungs: Clear to auscultation bilaterally. Vascular: No carotid bruits. Neurological Exam: Mental status: alert and oriented to person, place, and time, recent and remote memory intact, fund of knowledge intact, attention and concentration intact, speech fluent and not dysarthric, language intact. Cranial nerves: CN I: not tested CN II: pupils equal, round and reactive to light, visual fields intact CN III, IV, VI:  full range of motion, no nystagmus, no ptosis CN V: facial sensation intact. CN VII: upper and lower face symmetric CN VIII: hearing intact CN IX, X: gag intact, uvula midline CN XI: sternocleidomastoid and trapezius muscles intact CN XII: tongue midline Bulk & Tone: normal, no fasciculations. Motor:  muscle strength 5/5 throughout Sensation:  Pinprick, temperature and vibratory sensation intact. Deep Tendon Reflexes:  2+ throughout,  toes downgoing.   Finger to nose testing:  Without dysmetria.   Heel to shin:  Without dysmetria.    Gait:  Normal station and stride.  Romberg negative.    Thank you for allowing me to take part in the care of this patient.  Metta Clines, DO  CC:  Jerene Bears, MD

## 2020-11-07 NOTE — Patient Instructions (Addendum)
1.  Check MRI of thoracic spine with and without contrast.  Pending results, we may need to consider a spinal tap 2.  Check TSH and carotid ultrasound 3.  Follow up after testing. 4.   No driving for 6 months from last seizure, as per [facility].   Please refer to the following link on the [facility] website for more information: [email or web address]  5.  Follow up after testing

## 2020-11-09 DIAGNOSIS — I10 Essential (primary) hypertension: Secondary | ICD-10-CM | POA: Diagnosis not present

## 2020-11-09 DIAGNOSIS — E785 Hyperlipidemia, unspecified: Secondary | ICD-10-CM | POA: Diagnosis not present

## 2020-11-13 ENCOUNTER — Other Ambulatory Visit: Payer: Self-pay

## 2020-11-13 ENCOUNTER — Ambulatory Visit
Admission: RE | Admit: 2020-11-13 | Discharge: 2020-11-13 | Disposition: A | Discharge status: Home / Self Care | Payer: Medicare Other | Source: Ambulatory Visit | Attending: Neurology | Admitting: Neurology

## 2020-11-13 DIAGNOSIS — D1809 Hemangioma of other sites: Secondary | ICD-10-CM | POA: Diagnosis not present

## 2020-11-13 DIAGNOSIS — M5124 Other intervertebral disc displacement, thoracic region: Secondary | ICD-10-CM | POA: Diagnosis not present

## 2020-11-13 DIAGNOSIS — G35 Multiple sclerosis: Secondary | ICD-10-CM | POA: Diagnosis not present

## 2020-11-13 DIAGNOSIS — R2681 Unsteadiness on feet: Secondary | ICD-10-CM

## 2020-11-13 DIAGNOSIS — E538 Deficiency of other specified B group vitamins: Secondary | ICD-10-CM | POA: Diagnosis not present

## 2020-11-13 DIAGNOSIS — Z299 Encounter for prophylactic measures, unspecified: Secondary | ICD-10-CM | POA: Diagnosis not present

## 2020-11-13 DIAGNOSIS — D45 Polycythemia vera: Secondary | ICD-10-CM | POA: Diagnosis not present

## 2020-11-13 DIAGNOSIS — R531 Weakness: Secondary | ICD-10-CM

## 2020-11-13 DIAGNOSIS — I10 Essential (primary) hypertension: Secondary | ICD-10-CM | POA: Diagnosis not present

## 2020-11-13 DIAGNOSIS — J449 Chronic obstructive pulmonary disease, unspecified: Secondary | ICD-10-CM | POA: Diagnosis not present

## 2020-11-13 DIAGNOSIS — R9082 White matter disease, unspecified: Secondary | ICD-10-CM

## 2020-11-13 MED ORDER — GADOBENATE DIMEGLUMINE 529 MG/ML IV SOLN
20.0000 mL | Freq: Once | INTRAVENOUS | Status: AC | PRN
  Administered 2020-11-13: 20 mL via INTRAVENOUS

## 2020-11-15 ENCOUNTER — Telehealth: Payer: Self-pay

## 2020-11-15 DIAGNOSIS — M545 Low back pain, unspecified: Secondary | ICD-10-CM | POA: Diagnosis not present

## 2020-11-15 DIAGNOSIS — I10 Essential (primary) hypertension: Secondary | ICD-10-CM | POA: Diagnosis not present

## 2020-11-15 DIAGNOSIS — R269 Unspecified abnormalities of gait and mobility: Secondary | ICD-10-CM | POA: Diagnosis not present

## 2020-11-15 DIAGNOSIS — R569 Unspecified convulsions: Secondary | ICD-10-CM | POA: Diagnosis not present

## 2020-11-15 DIAGNOSIS — R9082 White matter disease, unspecified: Secondary | ICD-10-CM

## 2020-11-15 DIAGNOSIS — G35 Multiple sclerosis: Secondary | ICD-10-CM | POA: Diagnosis not present

## 2020-11-15 DIAGNOSIS — R278 Other lack of coordination: Secondary | ICD-10-CM | POA: Diagnosis not present

## 2020-11-15 NOTE — Telephone Encounter (Signed)
Pt advised of her MRI results. PT agree to spinal tap.  Spinal tap order placed and faxed to [facility].

## 2020-11-15 NOTE — Telephone Encounter (Signed)
-----   Message from Pieter Partridge, DO sent at 11/15/2020  8:26 AM EDT ----- MRI of thoracic spine shows mild arthritic changes but nothing significant and no evidence of MS.  Therefore, for further workup of MS, I would proceed with spinal tap checking CSF for cell count, protein, glucose, gram stain and culture, cytology, oligoclonal bands and IgG index.

## 2020-11-22 ENCOUNTER — Other Ambulatory Visit (HOSPITAL_COMMUNITY)
Admission: RE | Admit: 2020-11-22 | Discharge: 2020-11-22 | Disposition: A | Discharge status: Home / Self Care | Payer: Medicare Other | Source: Ambulatory Visit | Attending: Neurology | Admitting: Neurology

## 2020-11-22 ENCOUNTER — Ambulatory Visit
Admission: RE | Admit: 2020-11-22 | Discharge: 2020-11-22 | Disposition: A | Discharge status: Home / Self Care | Payer: Medicare Other | Source: Ambulatory Visit | Attending: Neurology | Admitting: Neurology

## 2020-11-22 ENCOUNTER — Other Ambulatory Visit: Payer: Self-pay

## 2020-11-22 VITALS — BP 144/81 | HR 67

## 2020-11-22 DIAGNOSIS — R9402 Abnormal brain scan: Secondary | ICD-10-CM | POA: Diagnosis not present

## 2020-11-22 DIAGNOSIS — G9389 Other specified disorders of brain: Secondary | ICD-10-CM | POA: Diagnosis not present

## 2020-11-22 DIAGNOSIS — R9082 White matter disease, unspecified: Secondary | ICD-10-CM

## 2020-11-22 NOTE — Discharge Instructions (Signed)
Lumbar Puncture Discharge Instructions  1. Go home and rest quietly as needed. You may resume normal activities; however, do not exert yourself strongly or do any heavy lifting today and tomorrow.   2. DO NOT drive today.    3. You may resume your normal diet and medications unless otherwise indicated. Drink lots of extra fluids today and tomorrow.   4. The incidence of headache, nausea, or vomiting is about 5% (one in 20 patients).  If you develop a headache, lie flat for 24 hours and drink plenty of fluids until the headache goes away.  Caffeinated beverages may be helpful. If when you get up you still have a headache when standing, go back to bed and force fluids for another 24 hours.   5. If you develop severe nausea and vomiting or a headache that does not go away with the flat bedrest after 48 hours, please call [phone].   6. Call your physician for a follow-up appointment.  The results of your Lumbar Puncture will be sent directly to your physician and they will contact you.   7. If you have any questions or if complications develop after you arrive home, please call [phone].  Discharge instructions have been explained to the patient.  The patient, or the person responsible for the patient, fully understands these instructions.   Thank you for visiting our office today.

## 2020-11-22 NOTE — Progress Notes (Signed)
1 vial of Blood drawn from pts LAC to be sent with LP lab work. 1 successful attempt. Pt tolerated well. Gauze and paper tape applied to site after.

## 2020-11-25 ENCOUNTER — Other Ambulatory Visit: Payer: Self-pay | Admitting: Neurology

## 2020-11-25 ENCOUNTER — Other Ambulatory Visit: Payer: Self-pay

## 2020-11-25 ENCOUNTER — Ambulatory Visit
Admission: RE | Admit: 2020-11-25 | Discharge: 2020-11-25 | Disposition: A | Discharge status: Home / Self Care | Payer: Medicare Other | Source: Ambulatory Visit | Attending: Neurology | Admitting: Neurology

## 2020-11-25 ENCOUNTER — Telehealth: Payer: Self-pay | Admitting: Neurology

## 2020-11-25 DIAGNOSIS — G971 Other reaction to spinal and lumbar puncture: Secondary | ICD-10-CM | POA: Diagnosis not present

## 2020-11-25 LAB — CYTOLOGY - NON PAP

## 2020-11-25 NOTE — Telephone Encounter (Signed)
Patient called in stating she needs an order sent to [facility] for blood patch.  She had her spinal tap done on Friday and got a headache and sick during the night. They don't think the hole has closed up yet and she needs that order so they can see her today.

## 2020-11-25 NOTE — Telephone Encounter (Signed)
Patient called to check on the status of the request. She said if [facility] doesn't get the order by 1PM today, she'll have to wait until tomorrow.

## 2020-11-25 NOTE — Discharge Instructions (Signed)

## 2020-11-25 NOTE — Progress Notes (Signed)
20 cc of blood drawn from pts RAC for blood patch. 1 successful attempt. Pt tolerated well.

## 2020-11-25 NOTE — Telephone Encounter (Signed)
Vanessa Mcconnell, would you be able to place order?  Thanks

## 2020-11-25 NOTE — Telephone Encounter (Signed)
Order has been placed and patient has been notified. Informed patient if she has any issues to give [place] a call back

## 2020-11-28 ENCOUNTER — Telehealth: Payer: Self-pay | Admitting: Neurology

## 2020-11-28 NOTE — Telephone Encounter (Signed)
Please see result note from CSF labs

## 2020-11-28 NOTE — Telephone Encounter (Signed)
Patient advised,stated,"she was severe weakness in her left arm, I advised her to see ED or PCP.

## 2020-11-28 NOTE — Telephone Encounter (Signed)
Patient called and left a voice mail with concerns she was told to follow up in 4 hours by the Access Nurse. Please call.

## 2020-11-28 NOTE — Telephone Encounter (Signed)
fyi

## 2020-11-29 DIAGNOSIS — Z299 Encounter for prophylactic measures, unspecified: Secondary | ICD-10-CM | POA: Diagnosis not present

## 2020-11-29 DIAGNOSIS — G35 Multiple sclerosis: Secondary | ICD-10-CM | POA: Diagnosis not present

## 2020-11-29 DIAGNOSIS — D45 Polycythemia vera: Secondary | ICD-10-CM | POA: Diagnosis not present

## 2020-11-29 DIAGNOSIS — R519 Headache, unspecified: Secondary | ICD-10-CM | POA: Diagnosis not present

## 2020-11-29 DIAGNOSIS — I10 Essential (primary) hypertension: Secondary | ICD-10-CM | POA: Diagnosis not present

## 2020-12-02 LAB — GRAM STAIN
GRAM STAIN:: NONE SEEN
MICRO NUMBER:: 11887902
SPECIMEN QUALITY:: ADEQUATE

## 2020-12-02 LAB — PROTEIN, CSF: Total Protein, CSF: 34 mg/dL (ref 15–45)

## 2020-12-02 LAB — OLIGOCLONAL BANDS, CSF + SERM

## 2020-12-02 LAB — CSF CELL COUNT WITH DIFFERENTIAL
RBC Count, CSF: 62 cells/uL — ABNORMAL HIGH
WBC, CSF: 3 cells/uL (ref 0–5)

## 2020-12-02 LAB — CNS IGG SYNTHESIS RATE, CSF+BLOOD
Albumin Serum: 4.5 g/dL (ref 3.2–4.6)
Albumin, CSF: 9.7 mg/dL (ref 8.0–42.0)
CNS-IgG Synthesis Rate: -3.1 mg/24 h (ref <=3.3)
IgG (Immunoglobin G), Serum: 805 mg/dL (ref 600–1640)
IgG Total CSF: 0.8 mg/dL (ref 0.8–7.7)
IgG-Index: 0.46 (ref <0.66)

## 2020-12-02 LAB — GLUCOSE, CSF: Glucose, CSF: 53 mg/dL (ref 40–80)

## 2020-12-09 DIAGNOSIS — I10 Essential (primary) hypertension: Secondary | ICD-10-CM | POA: Diagnosis not present

## 2020-12-09 DIAGNOSIS — E785 Hyperlipidemia, unspecified: Secondary | ICD-10-CM | POA: Diagnosis not present

## 2020-12-11 DIAGNOSIS — H9202 Otalgia, left ear: Secondary | ICD-10-CM | POA: Diagnosis not present

## 2020-12-11 DIAGNOSIS — H903 Sensorineural hearing loss, bilateral: Secondary | ICD-10-CM | POA: Diagnosis not present

## 2020-12-16 ENCOUNTER — Other Ambulatory Visit: Payer: Self-pay | Admitting: Internal Medicine

## 2020-12-16 ENCOUNTER — Ambulatory Visit
Admission: RE | Admit: 2020-12-16 | Discharge: 2020-12-16 | Disposition: A | Discharge status: Home / Self Care | Payer: Medicare Other | Source: Ambulatory Visit | Attending: Internal Medicine | Admitting: Internal Medicine

## 2020-12-16 ENCOUNTER — Other Ambulatory Visit: Payer: Self-pay

## 2020-12-16 DIAGNOSIS — Z139 Encounter for screening, unspecified: Secondary | ICD-10-CM

## 2020-12-16 DIAGNOSIS — Z1231 Encounter for screening mammogram for malignant neoplasm of breast: Secondary | ICD-10-CM | POA: Diagnosis not present

## 2020-12-17 ENCOUNTER — Other Ambulatory Visit: Payer: Self-pay | Admitting: Neurology

## 2020-12-17 DIAGNOSIS — R42 Dizziness and giddiness: Secondary | ICD-10-CM

## 2020-12-17 DIAGNOSIS — H93A9 Pulsatile tinnitus, unspecified ear: Secondary | ICD-10-CM

## 2020-12-17 DIAGNOSIS — R2681 Unsteadiness on feet: Secondary | ICD-10-CM

## 2020-12-19 ENCOUNTER — Other Ambulatory Visit: Payer: Self-pay | Admitting: Internal Medicine

## 2020-12-19 DIAGNOSIS — R928 Other abnormal and inconclusive findings on diagnostic imaging of breast: Secondary | ICD-10-CM

## 2020-12-30 DIAGNOSIS — G35 Multiple sclerosis: Secondary | ICD-10-CM | POA: Diagnosis not present

## 2020-12-30 DIAGNOSIS — Z299 Encounter for prophylactic measures, unspecified: Secondary | ICD-10-CM | POA: Diagnosis not present

## 2020-12-30 DIAGNOSIS — I10 Essential (primary) hypertension: Secondary | ICD-10-CM | POA: Diagnosis not present

## 2020-12-30 DIAGNOSIS — R109 Unspecified abdominal pain: Secondary | ICD-10-CM | POA: Diagnosis not present

## 2020-12-30 DIAGNOSIS — R609 Edema, unspecified: Secondary | ICD-10-CM | POA: Diagnosis not present

## 2021-01-06 ENCOUNTER — Encounter: Payer: Self-pay | Admitting: Neurology

## 2021-01-06 ENCOUNTER — Ambulatory Visit: Payer: Medicare Other | Admitting: Neurology

## 2021-01-06 VITALS — BP 139/97 | HR 77 | Ht 63.0 in | Wt 189.5 lb

## 2021-01-06 DIAGNOSIS — F4310 Post-traumatic stress disorder, unspecified: Secondary | ICD-10-CM

## 2021-01-06 DIAGNOSIS — R200 Anesthesia of skin: Secondary | ICD-10-CM

## 2021-01-06 DIAGNOSIS — H539 Unspecified visual disturbance: Secondary | ICD-10-CM

## 2021-01-06 DIAGNOSIS — G629 Polyneuropathy, unspecified: Secondary | ICD-10-CM | POA: Diagnosis not present

## 2021-01-06 DIAGNOSIS — R9082 White matter disease, unspecified: Secondary | ICD-10-CM

## 2021-01-06 MED ORDER — GABAPENTIN 300 MG PO CAPS: ORAL_CAPSULE | ORAL | 11 refills | Status: DC

## 2021-01-06 NOTE — Progress Notes (Signed)
[facility]  PATIENT: Vanessa Mcconnell DOB: 1960-10-19  REFERRING DOCTOR OR PCP:  Jerene Bears, MD SOURCE: Patient, notes from [facility] neurology, notes from [facility] neurology, imaging and lab reports, MRI images personally reviewed.  _________________________________   HISTORICAL  CHIEF COMPLAINT:  Chief Complaint  Patient presents with   New Patient (Initial Visit)    RM 13, alone. Paper referral from Barton Fanny, NP for seizures vs MS. Hx cousins w/ MS. Past sx and ongoing: Falling, dizziness, pain, cognitive issues, vision blurry intermittently, tunnel vision, grip around torso intermittently. Has chronic ear infections in L ear. Sees ENT for this.  Had LP in May and never got results or most recent MRI. Would like to go over these results. She wears glasses (got rx about 2 years ago). Vision 20/20 for R, L, and both eyes.   Seizures     Never been dx w/ seizures but told EEG abnormal    HISTORY OF PRESENT ILLNESS:  I had the pleasure of seeing your patient, Vanessa Mcconnell, at [facility] for neurologic consultation regarding her abnormal brain MRI and multiple symptoms.  She is a 60 year old woman who reports episodes of falling, reduced focus/attention, visual changes and dysesthesias.    Many of the symptoms started in 2018/2019.   She began to fall a few years ago.  She fell in July 2021, twice in November and again in February.    With these falls, she stumbles without reason.  One fall last year led to multiple lacerations.   She has had intermittent blurred vision. She has had a tight sensation around her torso like a python squeezing her that occur a few times a year.   Currently, she noes stumbling a lot.   She has asthma, COPD and PH and gets winded easily.   She often falls against a wall    She notes vision fluctuates with episodes of blurry vision.   She feels her short term memory is poor.    She sleeps poorly most nights.    She has PTSD.   She has confusional  arousals and sometimes wakes up with sweating.     She was on gabapentin 300-300-600 but was switched to Lyrica and had weight gain.   She also has been told she might be having seizures due to the nocturnal spells..   She is on Keppra 500 mg po bid.   EEG showed occasiona bitemporal slowing, left > right prompting the diagnosis of seizures.      She saw Dr. Merlene Laughter and he checked additional MRI of the brain and cervical spine.  The  09/12/2020 brain MRI shows nonspecific foci predominantly in subcortical white matter -these are much more consistent with chronic microvascular ischemic change than demyelination.   No definite change compared to 2019.   MRI cervical spine 09/12/2020 showed a normal spinal cord.  She had a left paramedian protrusion at C5C6 causing mild spinal stenosis.     She also saw Dr. Tomi Likens who checked an MRI of the thoracic spine (spinal cord normla has some protrusions to the right at T6T7 and T7T8, left at T9T10 and T10T11 but no nerve root compression or significant spinal stenosis).     She has had a lumbar puncture 11/22/2020.   IgG index was normal.   She had no oligoclonal bands (3 paired bands in CSF/serum usually just incidental finding).     REVIEW OF SYSTEMS: Constitutional: No fevers, chills, sweats, or change  in appetite.  Poor sleep. Eyes: No visual changes, double vision, eye pain Ear, nose and throat: No hearing loss, ear pain, nasal congestion, sore throat Cardiovascular: No chest pain, palpitations Respiratory: Frequent shortness of breath with exertion.   She has asthma, COPD and pulmonary hypertension. GastrointestinaI: No nausea, vomiting, diarrhea, abdominal pain, fecal incontinence Genitourinary:  No dysuria, urinary retention or frequency.  No nocturia. Musculoskeletal:  No neck pain, back pain.  Some myalgias Integumentary: No rash, pruritus, skin lesions Neurological: as above Psychiatric: She reports having PTSD.    Endocrine: No palpitations, diaphoresis, change in appetite, change in weigh or increased thirst Hematologic/Lymphatic:  No anemia, purpura, petechiae. Allergic/Immunologic: No itchy/runny eyes, nasal congestion, recent allergic reactions, rashes  ALLERGIES: Allergies  Allergen Reactions   Anesthesia S-I-40 [Propofol]    Benadryl [Diphenhydramine Hcl] Hives and Other (See Comments)    Passed out   Erythromycin Hives   Iohexol      Code: HIVES, Desc: Anaphylaxis/Hives, Onset Date: [id number]    Latex Rash   Penicillins Swelling and Rash    Has patient had a PCN reaction causing immediate rash, facial/tongue/throat swelling, SOB or lightheadedness with hypotension: Yes Has patient had a PCN reaction causing severe rash involving mucus membranes or skin necrosis: No Has patient had a PCN reaction that required hospitalization: No Has patient had a PCN reaction occurring within the last 10 years: No If all of the above answers are "NO", then may proceed with Cephalosporin use.   Tetracyclines & Related Rash    HOME MEDICATIONS:  Current Outpatient Medications:    albuterol (PROVENTIL HFA;VENTOLIN HFA) 108 (90 Base) MCG/ACT inhaler, Inhale into the lungs every 6 (six) hours as needed for wheezing or shortness of breath., Disp: , Rfl:    amLODipine (NORVASC) 5 MG tablet, Take 10 mg by mouth every evening., Disp: , Rfl: 0   ANORO ELLIPTA 62.5-25 MCG/INH AEPB, Inhale 1 puff into the lungs daily., Disp: , Rfl:    gabapentin (NEURONTIN) 300 MG capsule, Take 300 mg by mouth 3 (three) times daily., Disp: , Rfl:    levETIRAcetam (KEPPRA) 500 MG tablet, Take 500 mg by mouth in the morning and at bedtime., Disp: , Rfl:    losartan-hydrochlorothiazide (HYZAAR) 100-12.5 MG tablet, Take 1 tablet by mouth daily. , Disp: , Rfl:    meclizine (ANTIVERT) 25 MG tablet, Take 25 mg by mouth 3 (three) times daily as needed., Disp: , Rfl:    metoprolol succinate (TOPROL-XL) 25 MG 24 hr tablet, Take 25 mg by  mouth 2 (two) times daily., Disp: , Rfl: 5   olmesartan (BENICAR) 40 MG tablet, Take 40 mg by mouth daily., Disp: , Rfl:    ondansetron (ZOFRAN) 4 MG tablet, Take 4 mg by mouth as needed., Disp: , Rfl:    pantoprazole (PROTONIX) 40 MG tablet, Take 1 tablet (40 mg total) by mouth daily. 30 minutes before breakfast, Disp: 90 tablet, Rfl: 3   prazosin (MINIPRESS) 2 MG capsule, Take 2 mg by mouth at bedtime and may repeat dose one time if needed., Disp: , Rfl:    vitamin C (ASCORBIC ACID) 500 MG tablet, Take 500 mg by mouth daily., Disp: , Rfl:    zinc gluconate 50 MG tablet, Take 50 mg by mouth daily., Disp: , Rfl:   PAST MEDICAL HISTORY: Past Medical History:  Diagnosis Date   Arthritis    Asthma    Essential hypertension    Fibromyalgia    History of migraine    Hyperlipidemia  Malignant hyperthermia    Reportedly at age 89 with tonsillectomy   Pancreatitis    Reportedly single episode in childhood   Small intestinal bacterial overgrowth 12/2015   Positive breath test   Thoracic disc herniation     PAST SURGICAL HISTORY: Past Surgical History:  Procedure Laterality Date   ABDOMINAL HYSTERECTOMY     BREAST BIOPSY Left 1990's   3 areas biopsied at different times, all with needle not excisional   COLONOSCOPY N/A 02/11/2015   UDJ:SHFWYOVZ diverticulosis in the sigmoid colon/moderate internal hemorrhoids   DEBRIDEMENT LEG     cellulitis debridement as a teen   ESOPHAGEAL DILATION  02/11/2015   Procedure: ESOPHAGEAL DILATION;  Surgeon: Danie Binder, MD;  Location: [facility];  Service: Endoscopy;;   ESOPHAGOGASTRODUODENOSCOPY N/A 02/11/2015   CHY:IFOYDXAJ size HH/mild non-erosive gastritis   strep infection     causing skin infection, debridement buttocks, hip, legs   TONSILLECTOMY     ULNER NERVE SURGERY     Right Elbow    FAMILY HISTORY: Family History  Problem Relation Age of Onset   Hypertension Mother    Diabetes Father    Heart attack Father        CABG X'3 &  stent placement   Hypertension Father    Epilepsy Sister    Supraventricular tachycardia Sister    Other Sister        "hole in heart"   Heart attack Maternal Grandfather    Peripheral Artery Disease Sister    CAD Sister    Congestive Heart Failure Sister        mild diastolic heart failure   Heart disease Son    Heart disease Son    Colon cancer Neg Hx    Breast cancer Neg Hx     SOCIAL HISTORY:  Social History   Socioeconomic History   Marital status: Married    Spouse name: Not on file   Number of children: Not on file   Years of education: Not on file   Highest education level: Not on file  Occupational History   Not on file  Tobacco Use   Smoking status: Never   Smokeless tobacco: Never   Tobacco comments:    Never smoked  Vaping Use   Vaping Use: Never used  Substance and Sexual Activity   Alcohol use: No    Alcohol/week: 0.0 standard drinks   Drug use: No   Sexual activity: Not on file  Other Topics Concern   Not on file  Social History Narrative   Right handed   Caffeine use: daily   Social Determinants of Health   Financial Resource Strain: Not on file  Food Insecurity: Not on file  Transportation Needs: Not on file  Physical Activity: Not on file  Stress: Not on file  Social Connections: Not on file  Intimate Partner Violence: Not on file     PHYSICAL EXAM  Vitals:   01/06/21 1015  BP: (!) 139/97  Pulse: 77  Weight: 189 lb 8 oz (86 kg)  Height: _0  (1.6 m)    Body mass index is 33.57 kg/m.   General: The patient is well-developed and well-nourished and in no acute distress  HEENT:  Head is [place]/AT.  Sclera are anicteric.  Funduscopic exam shows normal optic discs and retinal vessels.  Neck: No carotid bruits are noted.  The neck is nontender.  Cardiovascular: The heart has a regular rate and rhythm with a normal S1 and S2.  There were no murmurs, gallops or rubs.    Skin: Extremities are without rash or   edema.  Musculoskeletal:  Back is nontender  Neurologic Exam  Mental status: The patient is alert and oriented x 3 at the time of the examination. The patient has apparent normal recent and remote memory, with an apparently normal attention span and concentration ability.   Speech is normal.  Cranial nerves: Extraocular movements are full. Pupils are equal, round, and reactive to light and accomodation.  Visual fields are full.  Facial symmetry is present. There is good facial sensation to soft touch bilaterally.Facial strength is normal.  Trapezius and sternocleidomastoid strength is normal. No dysarthria is noted.  The tongue is midline, and the patient has symmetric elevation of the soft palate. No obvious hearing deficits are noted.  Motor:  Muscle bulk is normal.   Tone is normal. Strength is  5 / 5 in all 4 extremities.   Sensory: Temperature feels colder on the left but touch feels more on the right arm.   In legs, temperature is colder on the right.   Vibration is stronger on hth right     Coordination: Cerebellar testing reveals good finger-nose-finger and heel-to-shin bilaterally.  Gait and station: Station is normal.   Gait is slightly wide and arthritis.   Tandem is wide.  Romberg is negative.   Reflexes: Deep tendon reflexes are symmetric and normal bilaterally (3 in legs and 2 in arms).   Plantar responses are flexor.    DIAGNOSTIC DATA (LABS, IMAGING, TESTING) - I reviewed patient records, labs, notes, testing and imaging myself where available.  Lab Results  Component Value Date   WBC 11.7 (H) 08/26/2017   HGB 15.1 (H) 08/26/2017   HCT 45.6 08/26/2017   MCV 90.8 08/26/2017   PLT 343 08/26/2017      Component Value Date/Time   NA 138 08/27/2017 0840   K 3.9 08/27/2017 0840   CL 103 08/27/2017 0840   CO2 27 08/27/2017 0840   GLUCOSE 120 (H) 08/27/2017 0840   BUN 14 08/27/2017 0840   CREATININE 0.74 08/27/2017 0840   CREATININE 0.64 02/05/2015 0941   CALCIUM  9.2 08/27/2017 0840   PROT 7.9 08/26/2017 1453   ALBUMIN 4.5 11/22/2020 1137   AST 16 08/26/2017 1453   ALT 17 08/26/2017 1453   ALKPHOS 125 08/26/2017 1453   BILITOT 0.7 08/26/2017 1453   GFRNONAA >60 08/27/2017 0840   GFRAA >60 08/27/2017 0840    Lab Results  Component Value Date   TSH 1.54 12/26/2015       ASSESSMENT AND PLAN  White matter abnormality on MRI of brain  Neuropathy - Plan: Vitamin B12, Sjogren's syndrome antibods(ssa + ssb), Multiple Myeloma Panel (SPEP&IFE w/QIG)  Numbness - Plan: Vitamin B12, Sjogren's syndrome antibods(ssa + ssb), Multiple Myeloma Panel (SPEP&IFE w/QIG)   In summary, Ms. Penafiel is a 60 year old woman reporting numbness, visual changes and difficulties with cognition over the past couple of years.  The MRI of the brain shows some scattered punctate T2/FLAIR hyperintense foci, predominantly in the subcortical white matter of the hemispheres.  This is a nonspecific finding and is most consistent with either mild chronic microvascular ischemic change or sequela of migraine headaches.  It does not have an appearance typical for demyelination.  Recent CSF did not show any oligoclonal bands or elevated IgG index.  MS is extremely unlikely.  I do not have a great explanation for her symptoms.  She reports a tight sensation  in the trunk but the cervical and thoracic spine do not show any explanation for the symptoms.  On exam, she had a symmetric subjective reduced sensation in her legs.  I will check B12, SSA/SSB and SPEP/IEF.  She felt her dysesthesias did better on gabapentin and Lyrica.  Additionally, she has gained weight on Lyrica.  Therefore, I will place her back on the gabapentin.  No follow-up was scheduled but she should call if she has major new or worsening neurologic symptoms.  Thank you for asking to see Ms. Bobette Mo.      Amani Nodarse A. Felecia Shelling, MD, [facility] 2/37/9909, 40:00 AM Certified in Neurology, Clinical Neurophysiology, Sleep  Medicine and Neuroimaging  [facility] [address] [place], [place] [address] [phone]

## 2021-01-08 LAB — MULTIPLE MYELOMA PANEL, SERUM
Albumin SerPl Elph-Mcnc: 3.8 g/dL (ref 2.9–4.4)
Albumin/Glob SerPl: 1.2 (ref 0.7–1.7)
Alpha 1: 0.3 g/dL (ref 0.0–0.4)
Alpha2 Glob SerPl Elph-Mcnc: 0.8 g/dL (ref 0.4–1.0)
B-Globulin SerPl Elph-Mcnc: 1.3 g/dL (ref 0.7–1.3)
Gamma Glob SerPl Elph-Mcnc: 0.8 g/dL (ref 0.4–1.8)
Globulin, Total: 3.2 g/dL (ref 2.2–3.9)
IgA/Immunoglobulin A, Serum: 385 mg/dL — ABNORMAL HIGH (ref 87–352)
IgG (Immunoglobin G), Serum: 755 mg/dL (ref 586–1602)
IgM (Immunoglobulin M), Srm: 90 mg/dL (ref 26–217)
Total Protein: 7 g/dL (ref 6.0–8.5)

## 2021-01-08 LAB — VITAMIN B12: Vitamin B-12: 643 pg/mL (ref 232–1245)

## 2021-01-08 LAB — SJOGREN'S SYNDROME ANTIBODS(SSA + SSB)
ENA SSA (RO) Ab: <0.2 AI (ref 0.0–0.9)
ENA SSB (LA) Ab: <0.2 AI (ref 0.0–0.9)

## 2021-01-09 ENCOUNTER — Ambulatory Visit (INDEPENDENT_AMBULATORY_CARE_PROVIDER_SITE_OTHER): Payer: Medicare Other

## 2021-01-09 DIAGNOSIS — E785 Hyperlipidemia, unspecified: Secondary | ICD-10-CM | POA: Diagnosis not present

## 2021-01-09 DIAGNOSIS — R42 Dizziness and giddiness: Secondary | ICD-10-CM

## 2021-01-09 DIAGNOSIS — H93A9 Pulsatile tinnitus, unspecified ear: Secondary | ICD-10-CM

## 2021-01-09 DIAGNOSIS — I10 Essential (primary) hypertension: Secondary | ICD-10-CM | POA: Diagnosis not present

## 2021-01-09 DIAGNOSIS — H93A2 Pulsatile tinnitus, left ear: Secondary | ICD-10-CM

## 2021-01-09 DIAGNOSIS — R2681 Unsteadiness on feet: Secondary | ICD-10-CM

## 2021-01-09 NOTE — Progress Notes (Signed)
Left message of results

## 2021-01-10 ENCOUNTER — Ambulatory Visit
Admission: RE | Admit: 2021-01-10 | Discharge: 2021-01-10 | Disposition: A | Discharge status: Home / Self Care | Payer: Medicare Other | Source: Ambulatory Visit | Attending: Internal Medicine | Admitting: Internal Medicine

## 2021-01-10 ENCOUNTER — Ambulatory Visit: Payer: Medicare Other

## 2021-01-10 ENCOUNTER — Other Ambulatory Visit: Payer: Self-pay

## 2021-01-10 DIAGNOSIS — R928 Other abnormal and inconclusive findings on diagnostic imaging of breast: Secondary | ICD-10-CM | POA: Diagnosis not present

## 2021-01-10 DIAGNOSIS — R922 Inconclusive mammogram: Secondary | ICD-10-CM | POA: Diagnosis not present

## 2021-01-16 ENCOUNTER — Telehealth: Payer: Self-pay | Admitting: Neurology

## 2021-01-16 NOTE — Telephone Encounter (Signed)
Called pt back. Advised MD sent her mychart message about normal results on 01/08/21. She did not receive this, could not get on mychart. She needs to reset password. I provided her mychart help desk# [phone] for this. She will follow up here as needed and let us know if she develops any new or worsening symptoms.

## 2021-01-16 NOTE — Telephone Encounter (Signed)
Pt states she lab work done on 06-27, she'd like a call with results as soon as they are available.

## 2021-01-20 DIAGNOSIS — R269 Unspecified abnormalities of gait and mobility: Secondary | ICD-10-CM | POA: Diagnosis not present

## 2021-01-20 DIAGNOSIS — M545 Low back pain, unspecified: Secondary | ICD-10-CM | POA: Diagnosis not present

## 2021-01-20 DIAGNOSIS — R569 Unspecified convulsions: Secondary | ICD-10-CM | POA: Diagnosis not present

## 2021-01-20 DIAGNOSIS — I10 Essential (primary) hypertension: Secondary | ICD-10-CM | POA: Diagnosis not present

## 2021-01-20 DIAGNOSIS — G35 Multiple sclerosis: Secondary | ICD-10-CM | POA: Diagnosis not present

## 2021-01-20 DIAGNOSIS — R278 Other lack of coordination: Secondary | ICD-10-CM | POA: Diagnosis not present

## 2021-03-12 DIAGNOSIS — I10 Essential (primary) hypertension: Secondary | ICD-10-CM | POA: Diagnosis not present

## 2021-03-12 DIAGNOSIS — E785 Hyperlipidemia, unspecified: Secondary | ICD-10-CM | POA: Diagnosis not present

## 2021-03-24 DIAGNOSIS — T2840XA Burn of unspecified internal organ, initial encounter: Secondary | ICD-10-CM | POA: Diagnosis not present

## 2021-03-24 DIAGNOSIS — D45 Polycythemia vera: Secondary | ICD-10-CM | POA: Diagnosis not present

## 2021-03-24 DIAGNOSIS — G35 Multiple sclerosis: Secondary | ICD-10-CM | POA: Diagnosis not present

## 2021-03-24 DIAGNOSIS — I10 Essential (primary) hypertension: Secondary | ICD-10-CM | POA: Diagnosis not present

## 2021-03-24 DIAGNOSIS — R5383 Other fatigue: Secondary | ICD-10-CM | POA: Diagnosis not present

## 2021-03-24 DIAGNOSIS — Z299 Encounter for prophylactic measures, unspecified: Secondary | ICD-10-CM | POA: Diagnosis not present

## 2021-04-11 DIAGNOSIS — I10 Essential (primary) hypertension: Secondary | ICD-10-CM | POA: Diagnosis not present

## 2021-04-11 DIAGNOSIS — E7800 Pure hypercholesterolemia, unspecified: Secondary | ICD-10-CM | POA: Diagnosis not present

## 2021-04-14 DIAGNOSIS — R278 Other lack of coordination: Secondary | ICD-10-CM | POA: Diagnosis not present

## 2021-04-14 DIAGNOSIS — R269 Unspecified abnormalities of gait and mobility: Secondary | ICD-10-CM | POA: Diagnosis not present

## 2021-04-14 DIAGNOSIS — R569 Unspecified convulsions: Secondary | ICD-10-CM | POA: Diagnosis not present

## 2021-04-14 DIAGNOSIS — Z79899 Other long term (current) drug therapy: Secondary | ICD-10-CM | POA: Diagnosis not present

## 2021-04-14 DIAGNOSIS — I10 Essential (primary) hypertension: Secondary | ICD-10-CM | POA: Diagnosis not present

## 2021-04-14 DIAGNOSIS — M545 Low back pain, unspecified: Secondary | ICD-10-CM | POA: Diagnosis not present

## 2021-04-15 DIAGNOSIS — Z79899 Other long term (current) drug therapy: Secondary | ICD-10-CM | POA: Diagnosis not present

## 2021-05-12 DIAGNOSIS — I10 Essential (primary) hypertension: Secondary | ICD-10-CM | POA: Diagnosis not present

## 2021-05-12 DIAGNOSIS — E7800 Pure hypercholesterolemia, unspecified: Secondary | ICD-10-CM | POA: Diagnosis not present

## 2021-06-09 DIAGNOSIS — Z299 Encounter for prophylactic measures, unspecified: Secondary | ICD-10-CM | POA: Diagnosis not present

## 2021-06-09 DIAGNOSIS — R21 Rash and other nonspecific skin eruption: Secondary | ICD-10-CM | POA: Diagnosis not present

## 2021-06-09 DIAGNOSIS — R6889 Other general symptoms and signs: Secondary | ICD-10-CM | POA: Diagnosis not present

## 2021-06-09 DIAGNOSIS — J029 Acute pharyngitis, unspecified: Secondary | ICD-10-CM | POA: Diagnosis not present

## 2021-06-24 DIAGNOSIS — R569 Unspecified convulsions: Secondary | ICD-10-CM | POA: Diagnosis not present

## 2021-06-24 DIAGNOSIS — I10 Essential (primary) hypertension: Secondary | ICD-10-CM | POA: Diagnosis not present

## 2021-06-24 DIAGNOSIS — M545 Low back pain, unspecified: Secondary | ICD-10-CM | POA: Diagnosis not present

## 2021-06-24 DIAGNOSIS — R278 Other lack of coordination: Secondary | ICD-10-CM | POA: Diagnosis not present

## 2021-06-24 DIAGNOSIS — Z79899 Other long term (current) drug therapy: Secondary | ICD-10-CM | POA: Diagnosis not present

## 2021-06-24 DIAGNOSIS — R269 Unspecified abnormalities of gait and mobility: Secondary | ICD-10-CM | POA: Diagnosis not present

## 2021-07-11 DIAGNOSIS — E785 Hyperlipidemia, unspecified: Secondary | ICD-10-CM | POA: Diagnosis not present

## 2021-08-05 ENCOUNTER — Emergency Department (HOSPITAL_COMMUNITY)
Admission: EM | Admit: 2021-08-05 | Discharge: 2021-08-05 | Disposition: A | Discharge status: Home / Self Care | Payer: Medicare Other | Attending: Emergency Medicine | Admitting: Emergency Medicine

## 2021-08-05 ENCOUNTER — Encounter (HOSPITAL_COMMUNITY): Payer: Self-pay | Admitting: *Deleted

## 2021-08-05 ENCOUNTER — Emergency Department (HOSPITAL_COMMUNITY): Payer: Medicare Other

## 2021-08-05 DIAGNOSIS — Y92481 Parking lot as the place of occurrence of the external cause: Secondary | ICD-10-CM | POA: Diagnosis not present

## 2021-08-05 DIAGNOSIS — W010XXA Fall on same level from slipping, tripping and stumbling without subsequent striking against object, initial encounter: Secondary | ICD-10-CM | POA: Insufficient documentation

## 2021-08-05 DIAGNOSIS — Z91040 Latex allergy status: Secondary | ICD-10-CM | POA: Insufficient documentation

## 2021-08-05 DIAGNOSIS — S62114A Nondisplaced fracture of triquetrum [cuneiform] bone, right wrist, initial encounter for closed fracture: Secondary | ICD-10-CM | POA: Insufficient documentation

## 2021-08-05 DIAGNOSIS — M79605 Pain in left leg: Secondary | ICD-10-CM | POA: Diagnosis not present

## 2021-08-05 DIAGNOSIS — Z043 Encounter for examination and observation following other accident: Secondary | ICD-10-CM | POA: Diagnosis not present

## 2021-08-05 DIAGNOSIS — S62111A Displaced fracture of triquetrum [cuneiform] bone, right wrist, initial encounter for closed fracture: Secondary | ICD-10-CM | POA: Diagnosis not present

## 2021-08-05 DIAGNOSIS — W19XXXA Unspecified fall, initial encounter: Secondary | ICD-10-CM

## 2021-08-05 DIAGNOSIS — R0781 Pleurodynia: Secondary | ICD-10-CM | POA: Diagnosis not present

## 2021-08-05 DIAGNOSIS — M25542 Pain in joints of left hand: Secondary | ICD-10-CM | POA: Insufficient documentation

## 2021-08-05 DIAGNOSIS — S299XXA Unspecified injury of thorax, initial encounter: Secondary | ICD-10-CM | POA: Diagnosis present

## 2021-08-05 DIAGNOSIS — S2241XA Multiple fractures of ribs, right side, initial encounter for closed fracture: Secondary | ICD-10-CM | POA: Insufficient documentation

## 2021-08-05 MED ORDER — METHYL SALICYLATE-LIDO-MENTHOL 4-4-5 % EX PTCH: 1.0000 | MEDICATED_PATCH | Freq: Two times a day (BID) | CUTANEOUS | 0 refills | Status: DC

## 2021-08-05 MED ORDER — DICLOFENAC EPOLAMINE 1.3 % EX PTCH
1.0000 | MEDICATED_PATCH | Freq: Two times a day (BID) | CUTANEOUS | Status: DC
  Administered 2021-08-05: 18:00:00 1 via TRANSDERMAL
  Filled 2021-08-05: qty 1

## 2021-08-05 MED ORDER — HYDROCODONE-ACETAMINOPHEN 5-325 MG PO TABS: 1.0000 | ORAL_TABLET | Freq: Four times a day (QID) | ORAL | 0 refills | Status: DC | PRN

## 2021-08-05 MED ORDER — ACETAMINOPHEN 500 MG PO TABS
1000.0000 mg | ORAL_TABLET | Freq: Once | ORAL | Status: AC
  Administered 2021-08-05: 16:00:00 1000 mg via ORAL
  Filled 2021-08-05: qty 2

## 2021-08-05 NOTE — ED Notes (Signed)
Patient transported to X-ray 

## 2021-08-05 NOTE — Discharge Instructions (Addendum)
As discussed, you have been diagnosed with both a wrist fracture and fractures of your ribs on the right side.  The former requires follow-up with our orthopedic colleague.  The rib fractures require ongoing monitoring, use of the provided incentive spirometer and continued use of pain medication patches.  You will likely be sore for the coming days, but should improve after that.  The pain patches may be available at a more reasonable price over-the-counter.  Return here for concerning changes in your condition

## 2021-08-05 NOTE — ED Provider Notes (Signed)
[facility] EMERGENCY DEPARTMENT Provider Note   CSN: [id number] Arrival date & time: 08/05/21  1454     History  Chief Complaint  Patient presents with   Vanessa Mcconnell is a 61 y.o. female.  HPI Adult female, previously well presents after fall with pain in multiple areas.  She was in her usual state of health until having mechanical fall which she recalls in its entirety.  She tripped, fell onto concrete, no head trauma, no loss of consciousness.  Since that time she has had pain in her right and left upper extremities, left distal lower extremity, and right rib cage.  There are some dyspnea associated with pain in the rib cage which is severe.  No medication taken for relief.  No confusion, disorientation, other chest pain, head pain, neck pain.    Home Medications Prior to Admission medications   Medication Sig Start Date End Date Taking? Authorizing Provider  albuterol (PROVENTIL HFA;VENTOLIN HFA) 108 (90 Base) MCG/ACT inhaler Inhale into the lungs every 6 (six) hours as needed for wheezing or shortness of breath.    [provider]  amLODipine (NORVASC) 5 MG tablet Take 10 mg by mouth every evening. 06/24/17   [provider]  ANORO ELLIPTA 62.5-25 MCG/INH AEPB Inhale 1 puff into the lungs daily. 10/12/20   [provider]  gabapentin (NEURONTIN) 300 MG capsule One po qAM, one po qPM, two po qhs 01/06/21   Sater, Nanine Means, MD  levETIRAcetam (KEPPRA) 500 MG tablet Take 500 mg by mouth in the morning and at bedtime. 10/04/20   [provider]  losartan-hydrochlorothiazide (HYZAAR) 100-12.5 MG tablet Take 1 tablet by mouth daily.  10/30/15   [provider]  meclizine (ANTIVERT) 25 MG tablet Take 25 mg by mouth 3 (three) times daily as needed. 10/10/20   [provider]  metoprolol succinate (TOPROL-XL) 25 MG 24 hr tablet Take 25 mg by mouth 2 (two) times daily. 06/14/17   [provider]  olmesartan (BENICAR) 40  MG tablet Take 40 mg by mouth daily.    [provider]  ondansetron (ZOFRAN) 4 MG tablet Take 4 mg by mouth as needed. 10/22/20   [provider]  pantoprazole (PROTONIX) 40 MG tablet Take 1 tablet (40 mg total) by mouth daily. 30 minutes before breakfast 12/02/17   Annitta Needs, NP  prazosin (MINIPRESS) 2 MG capsule Take 2 mg by mouth at bedtime and may repeat dose one time if needed. 10/04/20   [provider]  vitamin C (ASCORBIC ACID) 500 MG tablet Take 500 mg by mouth daily.    [provider]  zinc gluconate 50 MG tablet Take 50 mg by mouth daily.    [provider]      Allergies    Anesthesia s-i-40 [propofol], Benadryl [diphenhydramine hcl], Erythromycin, Iohexol, Latex, Penicillins, and Tetracyclines & related    Review of Systems   Review of Systems  Constitutional:        Per HPI, otherwise negative  HENT:         Per HPI, otherwise negative  Respiratory:         Per HPI, otherwise negative  Cardiovascular:        Per HPI, otherwise negative  Gastrointestinal:  Negative for vomiting.  Endocrine:       Negative aside from HPI  Genitourinary:        Neg aside from HPI   Musculoskeletal:  Per HPI, otherwise negative  Skin:  Positive for wound.  Neurological:  Negative for syncope.   Physical Exam Updated Vital Signs BP (!) 156/90 (BP Location: Left Arm)    Pulse 63    Temp 98.2 F (36.8 C)    Resp 18    SpO2 96%  Physical Exam Vitals and nursing note reviewed.  Constitutional:      General: She is not in acute distress.    Appearance: She is well-developed.  HENT:     Head: Normocephalic and atraumatic.  Eyes:     Conjunctiva/sclera: Conjunctivae normal.  Cardiovascular:     Rate and Rhythm: Normal rate and regular rhythm.  Pulmonary:     Effort: Pulmonary effort is normal. No respiratory distress.     Breath sounds: Normal breath sounds. No stridor.  Abdominal:     General: There is no distension.   Musculoskeletal:       Arms:       Legs:  Skin:    General: Skin is warm and dry.  Neurological:     Mental Status: She is alert and oriented to person, place, and time.     Cranial Nerves: No cranial nerve deficit.    ED Results / Procedures / Treatments   Labs (all labs ordered are listed, but only abnormal results are displayed) Labs Reviewed - No data to display  EKG None  Radiology DG Ribs Unilateral W/Chest Right  Result Date: 08/05/2021 CLINICAL DATA:  Fall in parking lot today.  Right rib pain. EXAM: RIGHT RIBS AND CHEST - 3+ VIEW COMPARISON:  Chest radiograph 08/26/2017 FINDINGS: Suspect nondisplaced fractures of the anterior right 6, seventh, and eighth ribs at the costochondral junction, appreciated only on the final oblique view. There is no evidence of pneumothorax or pleural effusion. Both lungs are clear. Heart size and mediastinal contours are within normal limits. IMPRESSION: Suspect nondisplaced fractures of anterior right sixth, seventh, and eighth ribs at the costochondral junction. No pulmonary complication such as pneumothorax. Electronically Signed   By: Keith Rake M.D.   On: 08/05/2021 16:15   DG Forearm Right  Result Date: 08/05/2021 CLINICAL DATA:  fall EXAM: RIGHT FOREARM - 2 VIEW; RIGHT WRIST - COMPLETE 3+ VIEW COMPARISON:  None. FINDINGS: Right forearm: Normal alignment. No acute fracture of the radius or ulna. The soft tissues are unremarkable. Right wrist: Mildly displaced acute fracture fragment along the posterior wrist, compatible with a triquetral fracture. Otherwise normal alignment of the carpal bones without additional fracture identified. Mild soft tissue swelling of the wrist. IMPRESSION: Right forearm, right wrist: Mildly displaced fracture of the triquetrum. Electronically Signed   By: Albin Felling M.D.   On: 08/05/2021 16:16   DG Wrist Complete Right  Result Date: 08/05/2021 CLINICAL DATA:  fall EXAM: RIGHT FOREARM - 2 VIEW; RIGHT  WRIST - COMPLETE 3+ VIEW COMPARISON:  None. FINDINGS: Right forearm: Normal alignment. No acute fracture of the radius or ulna. The soft tissues are unremarkable. Right wrist: Mildly displaced acute fracture fragment along the posterior wrist, compatible with a triquetral fracture. Otherwise normal alignment of the carpal bones without additional fracture identified. Mild soft tissue swelling of the wrist. IMPRESSION: Right forearm, right wrist: Mildly displaced fracture of the triquetrum. Electronically Signed   By: Albin Felling M.D.   On: 08/05/2021 16:16   DG Hand Complete Left  Result Date: 08/05/2021 CLINICAL DATA:  fall EXAM: LEFT HAND - COMPLETE 3+ VIEW COMPARISON:  None. FINDINGS: Normal alignment. No acute fracture.  The soft tissues are unremarkable. IMPRESSION: No malalignment or acute fracture. Electronically Signed   By: Albin Felling M.D.   On: 08/05/2021 16:18   DG Foot Complete Left  Result Date: 08/05/2021 CLINICAL DATA:  fall EXAM: LEFT FOOT - COMPLETE 3+ VIEW COMPARISON:  None. FINDINGS: Normal alignment. No acute fracture. Calcaneal plantar enthesophyte. The soft tissues are unremarkable. IMPRESSION: No malalignment or acute fracture. Electronically Signed   By: Albin Felling M.D.   On: 08/05/2021 16:20    Procedures Procedures    Medications Ordered in ED Medications  diclofenac (FLECTOR) 1.3 % 1 patch (has no administration in time range)  acetaminophen (TYLENOL) tablet 1,000 mg (1,000 mg Oral Given 08/05/21 1533)    ED Course/ Medical Decision Making/ A&P  5:38 PM Patient in no distress, no COVID by her husband.  I interpreted the x-rays, and demonstrated the pictures to them both in the room.  Findings concerning for triquetrum fracture as well as multiple rib fractures, no pneumothorax, no other findings.  Patient will receive wrist splint, and I discussed her case with her orthopedic colleague, Dr. Aline Brochure for follow-up.  Patient received topical NSAID as well as  narcotics for pain control.  With otherwise reassuring vital signs, no evidence for pneumothorax, no hemodynamic instability, patient discharged in stable condition.  5:52 PM Sling in place, splint well tolerated.                          Medical Decision Making Amount and/or Complexity of Data Reviewed Independent Historian: spouse Radiology: ordered. Decision-making details documented in ED Course. Discussion of management or test interpretation with external provider(s): Discussed the patient's case with her orthopedist, Dr. Aline Brochure for follow-up.  Risk OTC drugs. Prescription drug management.  Critical Care Total time providing critical care: < 30 minutes  Final Clinical Impression(s) / ED Diagnoses Final diagnoses:  Fall, initial encounter  Nondisplaced fracture of triquetrum (cuneiform) bone, right wrist, initial encounter for closed fracture  Closed fracture of multiple ribs of right side, initial encounter    Rx / DC Orders ED Discharge Orders          Ordered    HYDROcodone-acetaminophen (NORCO/VICODIN) 5-325 MG tablet  Every 6 hours PRN        08/05/21 0017    Methyl Salicylate-Lido-Menthol 4-4-5 % PTCH  2 times daily        08/05/21 1751              Carmin Muskrat, MD 08/05/21 1754

## 2021-08-05 NOTE — ED Notes (Signed)
ED Provider at bedside. 

## 2021-08-05 NOTE — ED Triage Notes (Signed)
Fell in [place] parking lot,pain in right hand, right rib cage, left foot and left hand

## 2021-08-06 DIAGNOSIS — R609 Edema, unspecified: Secondary | ICD-10-CM | POA: Diagnosis not present

## 2021-08-06 DIAGNOSIS — Z299 Encounter for prophylactic measures, unspecified: Secondary | ICD-10-CM | POA: Diagnosis not present

## 2021-08-06 DIAGNOSIS — G35 Multiple sclerosis: Secondary | ICD-10-CM | POA: Diagnosis not present

## 2021-08-06 DIAGNOSIS — I10 Essential (primary) hypertension: Secondary | ICD-10-CM | POA: Diagnosis not present

## 2021-08-06 DIAGNOSIS — J449 Chronic obstructive pulmonary disease, unspecified: Secondary | ICD-10-CM | POA: Diagnosis not present

## 2021-08-06 DIAGNOSIS — Z789 Other specified health status: Secondary | ICD-10-CM | POA: Diagnosis not present

## 2021-08-11 ENCOUNTER — Encounter: Payer: Self-pay | Admitting: Orthopedic Surgery

## 2021-08-11 ENCOUNTER — Other Ambulatory Visit: Payer: Self-pay

## 2021-08-11 ENCOUNTER — Ambulatory Visit: Payer: Medicare Other | Admitting: Orthopedic Surgery

## 2021-08-11 ENCOUNTER — Ambulatory Visit (INDEPENDENT_AMBULATORY_CARE_PROVIDER_SITE_OTHER): Payer: Medicare Other

## 2021-08-11 VITALS — BP 118/83 | HR 77 | Ht 62.0 in | Wt 189.0 lb

## 2021-08-11 DIAGNOSIS — M542 Cervicalgia: Secondary | ICD-10-CM | POA: Insufficient documentation

## 2021-08-11 DIAGNOSIS — G5600 Carpal tunnel syndrome, unspecified upper limb: Secondary | ICD-10-CM | POA: Insufficient documentation

## 2021-08-11 DIAGNOSIS — W19XXXA Unspecified fall, initial encounter: Secondary | ICD-10-CM | POA: Insufficient documentation

## 2021-08-11 DIAGNOSIS — S62111A Displaced fracture of triquetrum [cuneiform] bone, right wrist, initial encounter for closed fracture: Secondary | ICD-10-CM

## 2021-08-11 DIAGNOSIS — G894 Chronic pain syndrome: Secondary | ICD-10-CM | POA: Insufficient documentation

## 2021-08-11 DIAGNOSIS — S40021A Contusion of right upper arm, initial encounter: Secondary | ICD-10-CM

## 2021-08-11 DIAGNOSIS — M79673 Pain in unspecified foot: Secondary | ICD-10-CM | POA: Insufficient documentation

## 2021-08-11 DIAGNOSIS — M545 Low back pain, unspecified: Secondary | ICD-10-CM | POA: Insufficient documentation

## 2021-08-11 DIAGNOSIS — R296 Repeated falls: Secondary | ICD-10-CM | POA: Insufficient documentation

## 2021-08-11 DIAGNOSIS — M898X2 Other specified disorders of bone, upper arm: Secondary | ICD-10-CM | POA: Diagnosis not present

## 2021-08-11 DIAGNOSIS — S62112A Displaced fracture of triquetrum [cuneiform] bone, left wrist, initial encounter for closed fracture: Secondary | ICD-10-CM

## 2021-08-11 DIAGNOSIS — M797 Fibromyalgia: Secondary | ICD-10-CM | POA: Insufficient documentation

## 2021-08-11 DIAGNOSIS — S62114A Nondisplaced fracture of triquetrum [cuneiform] bone, right wrist, initial encounter for closed fracture: Secondary | ICD-10-CM | POA: Diagnosis not present

## 2021-08-11 NOTE — Progress Notes (Addendum)
EVALUATION AND MANAGEMENT   Type of appointment : ER follow-up  PLAN: We can use a splint for the triquetral fracture we will reevaluate her in a week regarding the distal arm pain  No orders of the defined types were placed in this encounter.    Chief Complaint  Patient presents with   Wrist Injury    Fractured RT wrist DOI 08/05/21  fall    61 year old female fell in the parking lot at [employer] injured her right hand had multiple complaints of pain had multiple x-rays right forearm right wrist right hand left upper extremity as well comes in complaining of pain in the right hand and distal to mid humerus     ROS History of chest pain history of coughing not present joint pain falls muscle aches has a history of MS  Body mass index is 34.57 kg/m.  Physical Exam Constitutional:      General: She is not in acute distress.    Appearance: She is well-developed.     Comments: Well developed, well nourished Normal grooming and hygiene     Cardiovascular:     Comments: No peripheral edema Musculoskeletal:     Comments: Right upper extremity exam right hand is tender and swollen there is some bruising decreased range of motion no atrophy no tremor  She has some tenderness midshaft humerus to distal humerus and decreased range of motion of the left shoulder  Skin:    General: Skin is warm and dry.  Neurological:     Mental Status: She is alert and oriented to person, place, and time.     Sensory: No sensory deficit.     Coordination: Coordination normal.     Gait: Gait normal.     Deep Tendon Reflexes: Reflexes are normal and symmetric.  Psychiatric:        Mood and Affect: Mood normal.        Behavior: Behavior normal.        Thought Content: Thought content normal.        Judgment: Judgment normal.     Comments: Affect normal     Past Medical History:  Diagnosis Date   Arthritis    Asthma    Essential hypertension    Fibromyalgia    History of migraine     Hyperlipidemia    Malignant hyperthermia    Reportedly at age 41 with tonsillectomy   Pancreatitis    Reportedly single episode in childhood   Small intestinal bacterial overgrowth 12/2015   Positive breath test   Thoracic disc herniation    Past Surgical History:  Procedure Laterality Date   ABDOMINAL HYSTERECTOMY     BREAST BIOPSY Left 1990's   3 areas biopsied at different times, all with needle not excisional   COLONOSCOPY N/A 02/11/2015   [id number]:[id number] diverticulosis in the sigmoid colon/moderate internal hemorrhoids   DEBRIDEMENT LEG     cellulitis debridement as a teen   ESOPHAGEAL DILATION  02/11/2015   Procedure: ESOPHAGEAL DILATION;  Surgeon: Danie Binder, MD;  [facility];  Service: Endoscopy;;   ESOPHAGOGASTRODUODENOSCOPY N/A 02/11/2015   GNF:[id number] size HH/mild non-erosive gastritis   strep infection     causing skin infection, debridement buttocks, hip, legs   TONSILLECTOMY     ULNER NERVE SURGERY     Right Elbow   Social History   Tobacco Use   Smoking status: Never   Smokeless tobacco: Never   Tobacco comments:    Never smoked  Vaping  Use   Vaping Use: Never used  Substance Use Topics   Alcohol use: No    Alcohol/week: 0.0 standard drinks   Drug use: No     Assessment and Plan:  Encounter Diagnoses  Name Primary?   Pain of right humerus    Triquetral chip fracture, right, closed, initial encounter Yes   Contusion of right upper extremity, initial encounter     X-ray of reviewed right hand triquetral fracture right wrist no fracture right forearm no fracture  Internal image right humerus no fracture

## 2021-08-18 ENCOUNTER — Ambulatory Visit (INDEPENDENT_AMBULATORY_CARE_PROVIDER_SITE_OTHER): Payer: Medicare Other | Admitting: Orthopedic Surgery

## 2021-08-18 ENCOUNTER — Encounter: Payer: Self-pay | Admitting: Orthopedic Surgery

## 2021-08-18 ENCOUNTER — Other Ambulatory Visit: Payer: Self-pay

## 2021-08-18 DIAGNOSIS — S43101A Unspecified dislocation of right acromioclavicular joint, initial encounter: Secondary | ICD-10-CM | POA: Diagnosis not present

## 2021-08-18 DIAGNOSIS — M898X2 Other specified disorders of bone, upper arm: Secondary | ICD-10-CM | POA: Diagnosis not present

## 2021-08-18 NOTE — Progress Notes (Signed)
Chief Complaint  Patient presents with   Fracture     triquetral fracture DOI1/24/23    61 year-old female had a fall injured her right hand sustained a triquetral fracture but during her evaluation she had pain in the distal to mid humerus and elbow area on the right and x-rays were negative we had her come back today to reevaluate that  The elbow and arm are getting better she can move the arm which is hurts when she moves the elbow but she has regained most of her range of motion except for the last few degrees of extension  She does have some tenderness over the [facility] joint without any step-off so I am adding a diagnosis of AC joint separation type I  We will x-ray her left hand in 4 weeks /triquetral fracture  Encounter Diagnoses  Name Primary?   Pain of right humerus Yes   AC separation, right, initial encounter type I

## 2021-08-22 DIAGNOSIS — M545 Low back pain, unspecified: Secondary | ICD-10-CM | POA: Diagnosis not present

## 2021-08-22 DIAGNOSIS — R569 Unspecified convulsions: Secondary | ICD-10-CM | POA: Diagnosis not present

## 2021-08-22 DIAGNOSIS — Z79899 Other long term (current) drug therapy: Secondary | ICD-10-CM | POA: Diagnosis not present

## 2021-09-07 ENCOUNTER — Encounter: Payer: Self-pay | Admitting: Cardiovascular Disease

## 2021-09-07 NOTE — Progress Notes (Signed)
Cardiology Office Note:    Date:  09/08/2021   ID:  Vanessa Mcconnell, DOB 29-Sep-1960, MRN [id number]  PCP:  Glenda Chroman, MD   [facility] Providers Cardiologist: Selinda Michaels to update primary MD,subspecialty MD or APP then REFRESH:1}    Referring MD: Berenice Primas, NP   Chief Complaint  Patient presents with   Leg Swelling   Hypertension         History of Present Illness:    Vanessa Mcconnell is a 61 y.o. female with a hx of HTN, fibromyaliga, HLD She presents for further evaluation of some leg swelling recently . She typically sees Dr. Domenic Polite in the [place] office.   She wanted to be seen sooner than he would be able to see her and was scheduled with me.    Both her parents were in hospice in January.   She and her daughter sat with them for the 2 weeks .   Minimal activity,   lots of sitting,  fast food, take out fod.   Was there for 24 hours for 11 days  Both parents passed away a month ago .    Has been more active and eating a better diet for the past month since the death of her parent s.   Hx of MS Had a seizure a month ago and broke R wrist, hand, A/C joint, 3 R ribs  Taked amlodipine 10 mg a day  Metoprolol XL 25 mg a day  Prazosin 2 mg a day ( for PTSD)    Echocardiogram from 2018 reveals normal left ventricular systolic function.  She has mild mitral valve prolapse with mild mitral regurgitation. Has occasional chest twinges.   Does not exercise ,  plans on exercising now that her parents have passed away   She is unsure of her medications   Past Medical History:  Diagnosis Date   Arthritis    Asthma    Essential hypertension    Fibromyalgia    History of migraine    Hyperlipidemia    Malignant hyperthermia    Reportedly at age 89 with tonsillectomy   Pancreatitis    Reportedly single episode in childhood   Small intestinal bacterial overgrowth 12/2015   Positive breath test   Thoracic disc herniation     Past  Surgical History:  Procedure Laterality Date   ABDOMINAL HYSTERECTOMY     BREAST BIOPSY Left 1990's   3 areas biopsied at different times, all with needle not excisional   COLONOSCOPY N/A 02/11/2015   ZJQ:[id number] diverticulosis in the sigmoid colon/moderate internal hemorrhoids   DEBRIDEMENT LEG     cellulitis debridement as a teen   ESOPHAGEAL DILATION  02/11/2015   Procedure: ESOPHAGEAL DILATION;  Surgeon: Danie Binder, MD;  [facility];  Service: Endoscopy;;   ESOPHAGOGASTRODUODENOSCOPY N/A 02/11/2015   KWI:OXBDZHGD size HH/mild non-erosive gastritis   strep infection     causing skin infection, debridement buttocks, hip, legs   TONSILLECTOMY     ULNER NERVE SURGERY     Right Elbow    Current Medications: Current Meds  Medication Sig   albuterol (PROVENTIL HFA;VENTOLIN HFA) 108 (90 Base) MCG/ACT inhaler Inhale into the lungs every 6 (six) hours as needed for wheezing or shortness of breath.   amLODipine (NORVASC) 5 MG tablet Take 1 tablet (5 mg total) by mouth daily.   ANORO ELLIPTA 62.5-25 MCG/INH AEPB Inhale 1 puff into the lungs daily.   gabapentin (NEURONTIN)  300 MG capsule One po qAM, one po qPM, two po qhs   HYDROcodone-acetaminophen (NORCO/VICODIN) 5-325 MG tablet Take 1 tablet by mouth every 6 (six) hours as needed for severe pain.   levETIRAcetam (KEPPRA) 750 MG tablet Take 750 mg by mouth daily. 2 TABLETS AT BEDTIME   meclizine (ANTIVERT) 25 MG tablet Take 25 mg by mouth 3 (three) times daily as needed.   Methyl Salicylate-Lido-Menthol 4-4-5 % PTCH Apply 1 patch topically in the morning and at bedtime.   metoprolol succinate (TOPROL-XL) 25 MG 24 hr tablet Take 25 mg by mouth 2 (two) times daily.   olmesartan (BENICAR) 40 MG tablet Take 40 mg by mouth daily.   ondansetron (ZOFRAN) 4 MG tablet Take 4 mg by mouth as needed.   pantoprazole (PROTONIX) 40 MG tablet Take 1 tablet (40 mg total) by mouth daily. 30 minutes before breakfast   prazosin (MINIPRESS) 2 MG  capsule Take 2 mg by mouth at bedtime and may repeat dose one time if needed.   vitamin C (ASCORBIC ACID) 500 MG tablet Take 500 mg by mouth daily.   zinc gluconate 50 MG tablet Take 50 mg by mouth daily.   [DISCONTINUED] amLODipine (NORVASC) 5 MG tablet Take 10 mg by mouth every evening.   [DISCONTINUED] losartan (COZAAR) 50 MG tablet Take 1 tablet (50 mg total) by mouth daily.   [DISCONTINUED] losartan-hydrochlorothiazide (HYZAAR) 100-12.5 MG tablet Take 1 tablet by mouth daily.      Allergies:   Anesthesia s-i-40 [propofol], Benadryl [diphenhydramine hcl], Erythromycin, Iohexol, Latex, Penicillins, and Tetracyclines & related   Social History   Socioeconomic History   Marital status: Married    Spouse name: Not on file   Number of children: Not on file   Years of education: Not on file   Highest education level: Not on file  Occupational History   Not on file  Tobacco Use   Smoking status: Never   Smokeless tobacco: Never   Tobacco comments:    Never smoked  Vaping Use   Vaping Use: Never used  Substance and Sexual Activity   Alcohol use: No    Alcohol/week: 0.0 standard drinks   Drug use: No   Sexual activity: Not on file  Other Topics Concern   Not on file  Social History Narrative   Right handed   Caffeine use: daily   Social Determinants of Health   Financial Resource Strain: Not on file  Food Insecurity: Not on file  Transportation Needs: Not on file  Physical Activity: Not on file  Stress: Not on file  Social Connections: Not on file     Family History: The patient's family history includes CAD in her sister; Congestive Heart Failure in her sister; Diabetes in her father; Epilepsy in her sister; Heart attack in her father and maternal grandfather; Heart disease in her son and son; Hypertension in her father and mother; Other in her sister; Peripheral Artery Disease in her sister; Supraventricular tachycardia in her sister. There is no history of Colon cancer  or Breast cancer.  ROS:   Please see the history of present illness.     All other systems reviewed and are negative.  EKGs/Labs/Other Studies Reviewed:    The following studies were reviewed today:   EKG: September 08, 2021: Normal sinus rhythm at 60.  No ST or T wave changes.  Recent Labs: No results found for requested labs within last 8760 hours.  Recent Lipid Panel No results found for: CHOL, TRIG,  HDL, CHOLHDL, VLDL, LDLCALC, LDLDIRECT   Risk Assessment/Calculations:           Physical Exam:    VS:  BP 136/70 (BP Location: Left Arm, Patient Position: Sitting, Cuff Size: Normal)    Pulse 60    Ht 5\' 2"  (1.575 m)    Wt 195 lb 3.2 oz (88.5 kg)    SpO2 97%    BMI 35.70 kg/m     Wt Readings from Last 3 Encounters:  09/08/21 195 lb 3.2 oz (88.5 kg)  08/11/21 189 lb (85.7 kg)  01/06/21 189 lb 8 oz (86 kg)     GEN:  Well nourished, well developed in no acute distress HEENT: Normal NECK: No JVD; No carotid bruits LYMPHATICS: No lymphadenopathy CARDIAC:  RR , soft mid systolic click,  soft systolic murmur  RESPIRATORY:  Clear to auscultation without rales, wheezing or rhonchi  ABDOMEN: Soft, non-tender, non-distended MUSCULOSKELETAL:  No edema; No deformity  SKIN: Warm and dry NEUROLOGIC:  Alert and oriented x 3 PSYCHIATRIC:  Normal affect   ASSESSMENT:    1. Essential hypertension   2. Medication management    PLAN:    In order of problems listed above:  HTN:   Blood pressure is fairly well controlled.  I think some of her leg edema is likely due to the amlodipine, high salt diet .   BP has improved since she is now more active/.   3.  Leg edema: I think that her leg edema last month was due to her circumstances.  She was taking care of her parents and was sitting and not exercising for much of the day for 11 days straight.  She was also eating lots of fast food and takeout food which was likely more salty than usual.  Her leg edema has resolved at this  point. She had an echocardiogram in 2018 which revealed normal left ventricular systolic function.  She has mild mitral valve prolapse with mild mitral vegetation.  Her exam does not suggest any worsening of her mitral valve prolapse or mitral regurgitation.   Will have her follow up with an APP in several months to make adjustments in her meds.      Medication Adjustments/Labs and Tests Ordered: Current medicines are reviewed at length with the patient today.  Concerns regarding medicines are outlined above.  Orders Placed This Encounter  Procedures   Basic metabolic panel   EKG [id number]   Meds ordered this encounter  Medications   amLODipine (NORVASC) 5 MG tablet    Sig: Take 1 tablet (5 mg total) by mouth daily.    Dispense:  90 tablet    Refill:  3   DISCONTD: losartan (COZAAR) 50 MG tablet    Sig: Take 1 tablet (50 mg total) by mouth daily.    Dispense:  90 tablet    Refill:  3    Patient Instructions  Medication Instructions:  Please discontinue your Losartan/HCTZ. Start Amlodipine 5 mg daily. Continue all other medications as listed.  *If you need a refill on your cardiac medications before your next appointment, please call your pharmacy*  Lab Work: Please have blood work in 3 weeks (BMP) If you have labs (blood work) drawn today and your tests are completely normal, you will receive your results only by: [place] (if you have MyChart) OR A paper copy in the mail If you have any lab test that is abnormal or we need to change your treatment, we will call you  to review the results.   Follow-Up: At [facility], you and your health needs are our priority.  As part of our continuing mission to provide you with exceptional heart care, we have created designated Provider Care Teams.  These Care Teams include your primary Cardiologist (physician) and Advanced Practice Providers (APPs -  Physician Assistants and Nurse Practitioners) who all work together to  provide you with the care you need, when you need it.  We recommend signing up for the patient portal called "MyChart".  Sign up information is provided on this After Visit Summary.  MyChart is used to connect with patients for Virtual Visits (Telemedicine).  Patients are able to view lab/test results, encounter notes, upcoming appointments, etc.  Non-urgent messages can be sent to your provider as well.   To learn more about what you can do with MyChart, go to [email or web address].    Your next appointment:   3 month(s)  The format for your next appointment:   In Person  Provider:   Christen Bame, NP     {    Thank you for choosing [facility]!!      Signed, Mertie Moores, MD  09/08/2021 11:44 AM    [place]

## 2021-09-08 ENCOUNTER — Ambulatory Visit: Payer: Medicare Other | Admitting: Cardiovascular Disease

## 2021-09-08 ENCOUNTER — Other Ambulatory Visit: Payer: Self-pay

## 2021-09-08 ENCOUNTER — Encounter: Payer: Self-pay | Admitting: Cardiovascular Disease

## 2021-09-08 ENCOUNTER — Telehealth: Payer: Self-pay | Admitting: Cardiovascular Disease

## 2021-09-08 VITALS — BP 136/70 | HR 60 | Ht 62.0 in | Wt 195.2 lb

## 2021-09-08 DIAGNOSIS — Z79899 Other long term (current) drug therapy: Secondary | ICD-10-CM | POA: Diagnosis not present

## 2021-09-08 DIAGNOSIS — I10 Essential (primary) hypertension: Secondary | ICD-10-CM

## 2021-09-08 MED ORDER — LOSARTAN POTASSIUM 50 MG PO TABS: 50.0000 mg | ORAL_TABLET | Freq: Every day | ORAL | 3 refills | Status: DC

## 2021-09-08 MED ORDER — AMLODIPINE BESYLATE 5 MG PO TABS: 5.0000 mg | ORAL_TABLET | Freq: Every day | ORAL | 3 refills | Status: AC

## 2021-09-08 NOTE — Patient Instructions (Addendum)
Medication Instructions:  Please discontinue your Losartan/HCTZ. Start Amlodipine 5 mg daily. Continue all other medications as listed.  *If you need a refill on your cardiac medications before your next appointment, please call your pharmacy*  Lab Work: Please have blood work in 3 weeks (BMP) If you have labs (blood work) drawn today and your tests are completely normal, you will receive your results only by: [place] (if you have MyChart) OR A paper copy in the mail If you have any lab test that is abnormal or we need to change your treatment, we will call you to review the results.   Follow-Up: At [facility], you and your health needs are our priority.  As part of our continuing mission to provide you with exceptional heart care, we have created designated Provider Care Teams.  These Care Teams include your primary Cardiologist (physician) and Advanced Practice Providers (APPs -  Physician Assistants and Nurse Practitioners) who all work together to provide you with the care you need, when you need it.  We recommend signing up for the patient portal called "MyChart".  Sign up information is provided on this After Visit Summary.  MyChart is used to connect with patients for Virtual Visits (Telemedicine).  Patients are able to view lab/test results, encounter notes, upcoming appointments, etc.  Non-urgent messages can be sent to your provider as well.   To learn more about what you can do with MyChart, go to [email or web address].    Your next appointment:   3 month(s)  The format for your next appointment:   In Person  Provider:   Christen Bame, NP     {    Thank you for choosing [place]!!

## 2021-09-08 NOTE — Telephone Encounter (Signed)
Pt c/o medication issue:  1. Name of Medication:    2. How are you currently taking this medication (dosage and times per day)?    3. Are you having a reaction (difficulty breathing--STAT)? no  4. What is your medication issue?   Patient called back to say that she is not on the the losartan so the order from the other medication can be put in.

## 2021-09-08 NOTE — Telephone Encounter (Signed)
Spoke with pt who verified she is not taking losartan or olmesartan.  Dr Acie Fredrickson is aware.  No new orders at this time.  I did remove olmesartan from medication list.

## 2021-09-15 ENCOUNTER — Other Ambulatory Visit: Payer: Self-pay | Admitting: Orthopedic Surgery

## 2021-09-15 ENCOUNTER — Ambulatory Visit: Payer: Medicare Other

## 2021-09-15 ENCOUNTER — Ambulatory Visit (INDEPENDENT_AMBULATORY_CARE_PROVIDER_SITE_OTHER): Payer: Medicare Other | Admitting: Orthopedic Surgery

## 2021-09-15 ENCOUNTER — Other Ambulatory Visit: Payer: Self-pay

## 2021-09-15 DIAGNOSIS — M25511 Pain in right shoulder: Secondary | ICD-10-CM

## 2021-09-15 DIAGNOSIS — S62111A Displaced fracture of triquetrum [cuneiform] bone, right wrist, initial encounter for closed fracture: Secondary | ICD-10-CM

## 2021-09-15 DIAGNOSIS — M25521 Pain in right elbow: Secondary | ICD-10-CM

## 2021-09-15 DIAGNOSIS — S62112A Displaced fracture of triquetrum [cuneiform] bone, left wrist, initial encounter for closed fracture: Secondary | ICD-10-CM

## 2021-09-15 NOTE — Patient Instructions (Signed)
Physical therapy has been ordered for you at [facility]. They should call you to schedule, [phone] is the phone number to call, if you want to call to schedule.   ? ?Follow up in 4 weeks ?

## 2021-09-15 NOTE — Addendum Note (Signed)
Addended by: Arther Abbott E on: 09/15/2021 11:41 AM   Modules accepted: Level of Service

## 2021-09-15 NOTE — Progress Notes (Signed)
Chief Complaint  ?Patient presents with  ? Hand Pain  ?  RT hand// hand has been hurting more lately. Unsure if it has been the weather or not  ?triquetral fracture//DOI 08/05/21  ? ?Encounter Diagnoses  ?Name Primary?  ? Triquetral chip fracture, right, closed, initial encounter Yes  ? Pain in right elbow   ? Acute pain of right shoulder   ? ? ?Ms. Tutton had a fall she had a triquetral fracture but has had continued soft tissue discomfort over the right AC joint and right lateral elbow right ulnar aspect of the wrist and dorsum of the hand where we saw a triquetral fracture on x-ray other x-rays were done and were negative ? ?I recommend she remove her brace ? ?I think she should have several weeks of OT to help with range of motion and strengthening of the right upper extremity and then see me again in 4 weeks ? ?Today's x-ray triquetral fracture no other abnormality seen ?

## 2021-09-15 NOTE — Progress Notes (Signed)
Mb  °

## 2021-09-16 ENCOUNTER — Encounter (HOSPITAL_COMMUNITY): Payer: Self-pay

## 2021-09-16 ENCOUNTER — Ambulatory Visit (HOSPITAL_COMMUNITY): Payer: Medicare Other | Attending: Orthopedic Surgery

## 2021-09-16 DIAGNOSIS — M25511 Pain in right shoulder: Secondary | ICD-10-CM | POA: Diagnosis not present

## 2021-09-16 DIAGNOSIS — R29898 Other symptoms and signs involving the musculoskeletal system: Secondary | ICD-10-CM | POA: Insufficient documentation

## 2021-09-16 DIAGNOSIS — M25611 Stiffness of right shoulder, not elsewhere classified: Secondary | ICD-10-CM | POA: Insufficient documentation

## 2021-09-16 DIAGNOSIS — M25531 Pain in right wrist: Secondary | ICD-10-CM | POA: Diagnosis not present

## 2021-09-16 DIAGNOSIS — M25631 Stiffness of right wrist, not elsewhere classified: Secondary | ICD-10-CM | POA: Diagnosis not present

## 2021-09-16 NOTE — Patient Instructions (Signed)
Complete the following exercises 2-3 times a day. ? ?Doorway Stretch ? ?Place each hand opposite each other on the doorway. (You can change where you feel the stretch by moving arms higher or lower.) ?Step through with one foot and bend front knee until a stretch is felt and hold. ?Step through with the opposite foot on the next rep. ?Hold for __20-30___ seconds. Repeat __2__times.  ? ? ? ?Scapular Retraction (Standing) ? ? ?With arms at sides, pinch shoulder blades together. ?Repeat __10__ times per set. Do __1__ sets per session. Do __2__ sessions per day. ? ? ?Posterior Capsule Stretch ? ? ?Stand or sit, one arm across body so hand rests over opposite shoulder. Gently push on crossed elbow with other hand until stretch is felt in shoulder of crossed arm. Hold _20-30__ seconds.  ?Repeat _2__ times per session. Do ___ sessions per day. ? ? ?Wall Flexion ? ?Slide your arm up the wall or door frame until a stretch is felt in your shoulder . Hold for 20-30 seconds. Complete 2 times  ? ? ? ?Shoulder Abduction Stretch ? ?Stand side ways by a wall with affected up on wall. Gently step in toward wall to feel stretch. Hold for 20-30 seconds. Complete 2 times. ? ?  ?WRIST FLEXOR STRETCH ? ?Use your unaffected hand to bend the affected wrist up as shown.  ? ?Keep the elbow straight on the affected side the entire time.   Hold for 20-30 seconds. Complete 2 times.  ? ? ? ? ?WRIST EXTENSOR STRETCH ? ?Use your unaffected hand to bend the affected wrist down as shown.  ? ?Keep the elbow straight on the affected side the entire time.   Hold for 20-30 seconds. Complete 2 times ? ? ?

## 2021-09-16 NOTE — Therapy (Signed)
[place], [place], [address] Phone: [phone]   Fax:  [phone]  Occupational Therapy Evaluation  Patient Details  Name: Vanessa Mcconnell MRN: [id number] Date of Birth: 1960-12-18 Referring Provider (OT): Arther Abbott, MD   Encounter Date: 09/16/2021   OT End of Session - 09/16/21 0909     Visit Number 1    Number of Visits 12    Date for OT Re-Evaluation 10/28/21    Authorization Type UHC Medicare    Authorization Time Period $20 copay no visit limit no authorization needed    Progress Note Due on Visit 10    OT Start Time 0815    OT Stop Time 0900    OT Time Calculation (min) 45 min    Activity Tolerance Patient tolerated treatment well    Behavior During Therapy [facility] for tasks assessed/performed             Past Medical History:  Diagnosis Date   Arthritis    Asthma    Essential hypertension    Fibromyalgia    History of migraine    Hyperlipidemia    Malignant hyperthermia    Reportedly at age 15 with tonsillectomy   Pancreatitis    Reportedly single episode in childhood   Small intestinal bacterial overgrowth 12/2015   Positive breath test   Thoracic disc herniation     Past Surgical History:  Procedure Laterality Date   ABDOMINAL HYSTERECTOMY     BREAST BIOPSY Left 1990's   3 areas biopsied at different times, all with needle not excisional   COLONOSCOPY N/A 02/11/2015   IWL:[id number] diverticulosis in the sigmoid colon/moderate internal hemorrhoids   DEBRIDEMENT LEG     cellulitis debridement as a teen   ESOPHAGEAL DILATION  02/11/2015   Procedure: ESOPHAGEAL DILATION;  Surgeon: Danie Binder, MD;  [facility];  Service: Endoscopy;;   ESOPHAGOGASTRODUODENOSCOPY N/A 02/11/2015   ERD:[id number] size HH/mild non-erosive gastritis   strep infection     causing skin infection, debridement buttocks, hip, legs   TONSILLECTOMY     ULNER NERVE SURGERY     Right Elbow     There were no vitals filed for this visit.   Subjective Assessment - 09/16/21 0820     Subjective  S: I had a seizure and fell.    Pertinent History Patient is a 61 y/o female S/P right triquetral chip fracture which occured from a mechanical fall on 08/05/21. She was placed in a full arm cast for 1.5 weeks then moved to a short arm brace for 5 weeks. Her brace was removed yesterday. She is experiencing RUE pain and weakness. Dr. Aline Brochure has referred patient to occupational therapy for evaluation and treatment.    Patient Stated Goals To get rid of her pain.    Currently in Pain? Yes    Pain Score 4     Pain Location Hand   forearm   Pain Orientation Right    Pain Descriptors / Indicators Aching    Pain Type Acute pain    Pain Radiating Towards wrist up to elbow    Pain Onset More than a month ago    Pain Frequency Constant    Aggravating Factors  Increased use    Pain Relieving Factors rest, massage, OTC pain medication, heat    Effect of Pain on Daily Activities severe effect               [facility] OT  Assessment - 09/16/21 0813       Assessment   Medical Diagnosis right triquetral chip fracture    Referring Provider (OT) Arther Abbott, MD    Onset Date/Surgical Date 08/05/21    Hand Dominance Right    Next MD Visit 10/13/21    Prior Therapy None      Precautions   Precautions Other (comment)    Precaution Comments 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 1    Has the patient had a decrease in activity level because of a fear of falling?  No    Is the patient reluctant to leave their home because of a fear of falling?  No      Home  Environment   Family/patient expects to be discharged to: Private residence      Prior Function   Level of Independence Independent    IT trainer work    Biomedical scientist [employer]      ADL   ADL comments Difficulty with grip strength, will have radiating pain      Mobility   Mobility Status Independent      Written Expression   Dominant Hand Right      Vision - History   Baseline Vision No visual deficits      Cognition   Overall Cognitive Status Within Functional Limits for tasks assessed      Observation/Other Assessments   Other Surveys  Select    Upper Extremity Functional Index  35      Posture/Postural Control   Posture/Postural Control Postural limitations    Postural Limitations Rounded Shoulders      Edema   Edema Slight edema noted around right wrist and MCP joints of right hand.      ROM / Strength   AROM / PROM / Strength AROM;PROM;Strength      Palpation   Palpation comment Moderate fascial restrictions in right upper arm, upper trapezius, and scapularis region.      AROM   Overall AROM Comments Assessed seated. IR/er abducted    AROM Assessment Site Shoulder;Wrist    Right/Left Shoulder Right;Left    Right Shoulder Flexion 125 Degrees    Right Shoulder ABduction 130 Degrees    Right Shoulder Internal Rotation 40 Degrees    Right Shoulder External Rotation 40 Degrees    Left Shoulder Flexion 125 Degrees    Left Shoulder ABduction 130 Degrees    Left Shoulder Internal Rotation --   WNL   Left Shoulder External Rotation --   WNL   Right/Left Wrist Right    Right Wrist Extension 46 Degrees    Right Wrist Flexion 20 Degrees    Right Wrist Radial Deviation 26 Degrees    Right Wrist Ulnar Deviation 12 Degrees      Strength   Overall Strength Comments Assessed    Strength Assessment Site Hand;Shoulder    Right/Left Shoulder Right;Left    Right Shoulder Flexion 3+/5    Right Shoulder ABduction 3+/5    Right Shoulder Internal Rotation 4-/5    Right Shoulder External Rotation 5/5    Left Shoulder Flexion 3+/5    Left Shoulder ABduction 3+/5    Left Shoulder Internal Rotation 4-/5    Left Shoulder External Rotation  5/5  Right/Left hand Left;Right    Right Hand Grip (lbs) 15    Right Hand Lateral Pinch 10 lbs    Right Hand 3 Point Pinch 6 lbs    Left Hand Grip (lbs) 35    Left Hand Lateral Pinch 15 lbs    Left Hand 3 Point Pinch 13 lbs                            UEFI - 09/16/21 0837     UEFI Total Score 35              OT Education - 09/16/21 0906     Education Details shoulder stretches, wrist stretches    Person(s) Educated Patient    Methods Explanation;Demonstration;Handout;Verbal cues    Comprehension Returned demonstration;Verbalized understanding              OT Short Term Goals - 09/16/21 0919       OT SHORT TERM GOAL #1   Title Pt will be educated and independent with HEP in order to faciliate her progress in therapy and allow for her to return to using her RUE as her dominant extremity.    Time 3    Period Weeks    Status New    Target Date 10/07/21      OT SHORT TERM GOAL #2   Title Patient will increase her Right wrist P/ROM to [facility] in order to increase her ability to turn knobs and handles when opening doors.    Time 3    Period Weeks    Status New               OT Long Term Goals - 09/16/21 0925       OT LONG TERM GOAL #1   Title Pt will increase her RUE shoulder and wrist A/ROM to WNL in order to complete all dressing, bathing, and grooming tasks with less difficulty.    Time 6    Period Weeks    Status New    Target Date 10/28/21      OT LONG TERM GOAL #2   Title Pt will increase her Right shoulder and wrist strength to 5/5 in order to return to lifting and carrying items of moderate weight (10 or more pounds)    Time 6    Period Weeks    Status New      OT LONG TERM GOAL #3   Title Patient will increase right hand grip strength to 25#, lateral pinch to 12#, and 3 point pinch to 11#  in order to open jars and containers with less dificulty.    Time 6    Period Weeks    Status New      OT LONG TERM GOAL #4   Title  Pt will report a decrease in pain level in her RUE of approximately 3/10 or less when utilizing it to complete daily tasks.    Time 6    Period Weeks    Status New                   Plan - 09/16/21 0911     Clinical Impression Statement A: Pt is a 61 y/o female S/P right triquetral chip fracture causing increased pain, fascial restrictions, edema, and decreased ROM and strength in her RUE resulting in maximum difficulty completing required daily, leisure, and volunteer related tasks.    OT Occupational Profile and History Problem  Focused Assessment - Including review of records relating to presenting problem    Occupational performance deficits (Please refer to evaluation for details): ADL's;Leisure;Rest and Sleep;IADL's    Body Structure / Function / Physical Skills ADL;UE functional use;Fascial restriction;Pain;ROM;Edema;Mobility;Strength    Rehab Potential Excellent    Clinical Decision Making Limited treatment options, no task modification necessary    Comorbidities Affecting Occupational Performance: Presence of comorbidities impacting occupational performance    Comorbidities impacting occupational performance description: Hx of MS and seizure disorder    Modification or Assistance to Complete Evaluation  No modification of tasks or assist necessary to complete eval    OT Frequency 2x / week    OT Duration 6 weeks    OT Treatment/Interventions Self-care/ADL training;Ultrasound;DME and/or AE instruction;Patient/family education;Passive range of motion;Paraffin;Cryotherapy;Electrical Stimulation;Moist Heat;Neuromuscular education;Therapeutic exercise;Manual Therapy;Therapeutic activities;Splinting    Plan P: Patient will benefit from skilled OT services to increase functional use of her RUE during daily tasks. Treatment plan: myofascial release, passive stretching, A/ROM, hand strengthening, general UE strengthening. Modalitiles PRN. Next session: Provide HEP for hand strengthening.     OT Home Exercise Plan eval: wrist and shoulder stretches    Consulted and Agree with Plan of Care Patient             Patient will benefit from skilled therapeutic intervention in order to improve the following deficits and impairments:   Body Structure / Function / Physical Skills: ADL, UE functional use, Fascial restriction, Pain, ROM, Edema, Mobility, Strength       Visit Diagnosis: Other symptoms and signs involving the musculoskeletal system - Plan: Ot plan of care cert/re-cert  Stiffness of right shoulder, not elsewhere classified - Plan: Ot plan of care cert/re-cert  Stiffness of right wrist, not elsewhere classified - Plan: Ot plan of care cert/re-cert  Acute pain of right shoulder - Plan: Ot plan of care cert/re-cert  Pain in right wrist - Plan: Ot plan of care cert/re-cert    Problem List Patient Active Problem List   Diagnosis Date Noted   Cervical spine pain 08/11/2021   Carpal tunnel syndrome 08/11/2021   Chronic pain syndrome 08/11/2021   Falls 08/11/2021   Foot pain 08/11/2021   Low back pain 08/11/2021   Primary fibromyalgia syndrome 08/11/2021   White matter abnormality on MRI of brain 01/06/2021   Bilateral tinnitus 09/18/2020   Dizziness and giddiness 09/18/2020   Difficulty balancing 09/18/2020   Asthma 01/17/2018   COPD (chronic obstructive pulmonary disease) ([place]) 01/17/2018   Pancreatitis 01/17/2018   OSA (obstructive sleep apnea) 01/17/2018   Constipation 12/06/2017   GERD (gastroesophageal reflux disease) 12/02/2017   RUQ pain 12/02/2017   Heme positive stool 12/02/2017   Chest pain 08/26/2017   Essential hypertension 08/26/2017   Hyperlipidemia 08/26/2017   Hypokalemia 08/26/2017   Small intestinal bacterial overgrowth 12/26/2015   Dysphagia 12/26/2015   Nausea without vomiting 11/04/2015   Elevated blood pressure 05/08/2015   Bloating 02/05/2015   Nausea with vomiting 02/05/2015   Hematochezia 02/05/2015   Ailene Ravel,  OTR/L,CBIS  [phone]  09/16/2021, 9:42 AM  [place] [address] [place], [place], [address] Phone: [phone]   Fax:  [phone]  Name: ZOHAL RENY MRN: [id number] Date of Birth: 1961-07-05

## 2021-09-23 ENCOUNTER — Other Ambulatory Visit: Payer: Self-pay

## 2021-09-23 ENCOUNTER — Ambulatory Visit (HOSPITAL_COMMUNITY): Payer: Medicare Other | Admitting: Occupational Therapy

## 2021-09-23 ENCOUNTER — Encounter (HOSPITAL_COMMUNITY): Payer: Self-pay | Admitting: Occupational Therapy

## 2021-09-23 DIAGNOSIS — M25611 Stiffness of right shoulder, not elsewhere classified: Secondary | ICD-10-CM

## 2021-09-23 DIAGNOSIS — R29898 Other symptoms and signs involving the musculoskeletal system: Secondary | ICD-10-CM

## 2021-09-23 DIAGNOSIS — M25631 Stiffness of right wrist, not elsewhere classified: Secondary | ICD-10-CM | POA: Diagnosis not present

## 2021-09-23 DIAGNOSIS — M25531 Pain in right wrist: Secondary | ICD-10-CM

## 2021-09-23 DIAGNOSIS — M25511 Pain in right shoulder: Secondary | ICD-10-CM

## 2021-09-23 NOTE — Patient Instructions (Signed)

## 2021-09-23 NOTE — Therapy (Signed)
?OUTPATIENT OCCUPATIONAL THERAPY TREATMENT NOTE ? ? ?Patient Name: Vanessa Mcconnell ?MRN: [id number] ?DOB:[birth year], 61 y.o., female ?Today's Date: 09/23/2021 ? ?PCP: Glenda Chroman, MD ?REFERRING PROVIDER: Dr. Arther Abbott ? ? OT End of Session - 09/23/21 0809   ? ? Visit Number 2   ? Number of Visits 12   ? Date for OT Re-Evaluation 10/28/21   ? Authorization Type UHC Medicare   ? Authorization Time Period $20 copay no visit limit no authorization needed   ? Progress Note Due on Visit 10   ? OT Start Time 0732   ? OT Stop Time 0810   ? OT Time Calculation (min) 38 min   ? Activity Tolerance Patient tolerated treatment well   ? Behavior During Therapy [facility] for tasks assessed/performed   ? ?  ?  ? ?  ? ? ?Past Medical History:  ?Diagnosis Date  ? Arthritis   ? Asthma   ? Essential hypertension   ? Fibromyalgia   ? History of migraine   ? Hyperlipidemia   ? Malignant hyperthermia   ? Reportedly at age 6 with tonsillectomy  ? Pancreatitis   ? Reportedly single episode in childhood  ? Small intestinal bacterial overgrowth 12/2015  ? Positive breath test  ? Thoracic disc herniation   ? ?Past Surgical History:  ?Procedure Laterality Date  ? ABDOMINAL HYSTERECTOMY    ? BREAST BIOPSY Left 1990's  ? 3 areas biopsied at different times, all with needle not excisional  ? COLONOSCOPY N/A 02/11/2015  ? EXH:[id number] diverticulosis in the sigmoid colon/moderate internal hemorrhoids  ? DEBRIDEMENT LEG    ? cellulitis debridement as a teen  ? ESOPHAGEAL DILATION  02/11/2015  ? Procedure: ESOPHAGEAL DILATION;  Surgeon: Danie Binder, MD;  [facility];  Service: Endoscopy;;  ? ESOPHAGOGASTRODUODENOSCOPY N/A 02/11/2015  ? LFY:BOFBPZWC size HH/mild non-erosive gastritis  ? strep infection    ? causing skin infection, debridement buttocks, hip, legs  ? TONSILLECTOMY    ? ULNER NERVE SURGERY    ? Right Elbow  ? ?Patient Active Problem List  ? Diagnosis Date Noted  ? Cervical spine pain 08/11/2021  ? Carpal tunnel syndrome  08/11/2021  ? Chronic pain syndrome 08/11/2021  ? Falls 08/11/2021  ? Foot pain 08/11/2021  ? Low back pain 08/11/2021  ? Primary fibromyalgia syndrome 08/11/2021  ? White matter abnormality on MRI of brain 01/06/2021  ? Bilateral tinnitus 09/18/2020  ? Dizziness and giddiness 09/18/2020  ? Difficulty balancing 09/18/2020  ? Asthma 01/17/2018  ? COPD (chronic obstructive pulmonary disease) ([place]) 01/17/2018  ? Pancreatitis 01/17/2018  ? OSA (obstructive sleep apnea) 01/17/2018  ? Constipation 12/06/2017  ? GERD (gastroesophageal reflux disease) 12/02/2017  ? RUQ pain 12/02/2017  ? Heme positive stool 12/02/2017  ? Chest pain 08/26/2017  ? Essential hypertension 08/26/2017  ? Hyperlipidemia 08/26/2017  ? Hypokalemia 08/26/2017  ? Small intestinal bacterial overgrowth 12/26/2015  ? Dysphagia 12/26/2015  ? Nausea without vomiting 11/04/2015  ? Elevated blood pressure 05/08/2015  ? Bloating 02/05/2015  ? Nausea with vomiting 02/05/2015  ? Hematochezia 02/05/2015  ? ? ?ONSET DATE: 08/05/21 ? ?REFERRING DIAG: H85.277O (ICD-10-CM) - Triquetral chip fracture, ? ?THERAPY DIAG:  ?Other symptoms and signs involving the musculoskeletal system ? ?Stiffness of right shoulder, not elsewhere classified ? ?Stiffness of right wrist, not elsewhere classified ? ?Acute pain of right shoulder ? ?Pain in right wrist ? ? ?PERTINENT HISTORY:  Patient is a 61 y/o female S/P right triquetral chip fracture  which occured from a mechanical fall on 08/05/21. She was placed in a full arm cast for 1.5 weeks then moved to a short arm brace for 5 weeks. ? ? 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 ?SUBJECTIVE: S: The shoulder is hit and miss but the wrist has been hurting for a few days.  ? ?PAIN:  ?Are you having pain? Yes: NPRS scale: 2/10 ?Pain location: right wrist and shoulder ?Pain description: aching, sharp at times ?Aggravating factors: unsure, weather ?Relieving factors: rest ? ? ? ? ?OBJECTIVE:  ? ?TODAY'S TREATMENT:  ?-Myofascial release:  completed separately from therapeutic exercises. Myofascial release to right ulnar wrist, upper arm, anterior shoulder, and trapezius regions to decrease pain and fascial restrictions and increase joint ROM.  ?-P/ROM-shoulder flexion, er/IR, horizontal ab/adduction, abduction, 5X each ?-P/ROM-wrist flexion, extension, radial and ulnar deviation, 5X each ?-A/ROM-wrist flexion, extension, radial and ulnar deviation, 10X each ?-Yellow theraputty-rolling into ball, flatten, roll, lateral and 3 point pinch ?-Sponges: 7, 7 ?-Shoulder A/ROM: protraction, flexion, horizontal abduction, 10X each ? ? ?PATIENT EDUCATION: ?Education details: grip strengthening with yellow theraputty ?Person educated: Patient ?Education method: Explanation, Demonstration, and Handouts ?Education comprehension: verbalized understanding and returned demonstration ? ? ?[place] ?Home Exercises Program ?Theraputty Exercises ? ?Do the following exercises 2 times a day using your affected hand.  ?1. Roll putty into a ball. ? ?2. Make into a pancake. ? ?3. Roll putty into a roll. ? ?4. Pinch along log with first finger and thumb.  ? ?5. Make into a ball. ? ?6. Roll it back into a log.  ? ?7. Pinch using thumb and side of first finger. ? ?8. Roll into a ball, then flatten into a pancake. ? ?9. Using your fingers, make putty into a mountain. ? ?10. Roll putty back into a ball and squeeze gently for 2-3 minutes.  ? ? ? OT Short Term Goals - 09/23/21 0731   ? ?  ? OT SHORT TERM GOAL #1  ? Title Pt will be educated and independent with HEP in order to faciliate her progress in therapy and allow for her to return to using her RUE as her dominant extremity.   ? Time 3   ? Period Weeks   ? Status On-going   ? Target Date 10/07/21   ?  ? OT SHORT TERM GOAL #2  ? Title Patient will increase her Right wrist P/ROM to [facility] in order to increase her ability to turn knobs and handles when opening doors.   ? Time 3   ? Period Weeks   ? Status On-going   ? ?   ?  ? ?  ? ? ? OT Long Term Goals - 09/23/21 0731   ? ?  ? OT LONG TERM GOAL #1  ? Title Pt will increase her RUE shoulder and wrist A/ROM to WNL in order to complete all dressing, bathing, and grooming tasks with less difficulty.   ? Time 6   ? Period Weeks   ? Status On-going   ? Target Date 10/28/21   ?  ? OT LONG TERM GOAL #2  ? Title Pt will increase her Right shoulder and wrist strength to 5/5 in order to return to lifting and carrying items of moderate weight (10 or more pounds)   ? Time 6   ? Period Weeks   ? Status On-going   ?  ? OT LONG TERM GOAL #3  ? Title Patient will increase right hand grip  strength to 25#, lateral pinch to 12#, and 3 point pinch to 11#  in order to open jars and containers with less dificulty.   ? Time 6   ? Period Weeks   ? Status On-going   ?  ? OT LONG TERM GOAL #4  ? Title Pt will report a decrease in pain level in her RUE of approximately 3/10 or less when utilizing it to complete daily tasks.   ? Time 6   ? Period Weeks   ? Status On-going   ? ?  ?  ? ?  ? ? ? Plan - 09/23/21 0801   ? ? Clinical Impression Statement A: Pt reports some discomfort over the weekend. Initiated myofascial release to right wrist and shoulder regions to address fascial restrictions. Completed P/ROM to shoulder and wrist, pt with shoulder ROM WFL today. Pt completing wrist and shoulder A/ROM, added yellow theraputty tasks for grip strengthening and updated HEP. Verbal cuing for form and technique.   ? Body Structure / Function / Physical Skills ADL;UE functional use;Fascial restriction;Pain;ROM;Edema;Mobility;Strength   ? Plan P: Follow up on HEP, continue shoulder A/ROM adding er/IR and abduction, add hand gripper activity   ? OT Home Exercise Plan eval: wrist and shoulder stretches; 3/14: yellow theraputty HEP   ? ?  ?  ? ?  ? ? ? ?Guadelupe Sabin, OTR/L  ?[phone] ?09/23/2021, 8:11 AM ? ?  ? ? ? ?

## 2021-09-25 ENCOUNTER — Other Ambulatory Visit: Payer: Self-pay | Admitting: Internal Medicine

## 2021-09-25 ENCOUNTER — Ambulatory Visit (HOSPITAL_COMMUNITY): Payer: Medicare Other | Admitting: Occupational Therapy

## 2021-09-25 ENCOUNTER — Other Ambulatory Visit: Payer: Self-pay

## 2021-09-25 ENCOUNTER — Encounter (HOSPITAL_COMMUNITY): Payer: Self-pay | Admitting: Occupational Therapy

## 2021-09-25 DIAGNOSIS — M25511 Pain in right shoulder: Secondary | ICD-10-CM | POA: Diagnosis not present

## 2021-09-25 DIAGNOSIS — R29898 Other symptoms and signs involving the musculoskeletal system: Secondary | ICD-10-CM | POA: Diagnosis not present

## 2021-09-25 DIAGNOSIS — M25631 Stiffness of right wrist, not elsewhere classified: Secondary | ICD-10-CM | POA: Diagnosis not present

## 2021-09-25 DIAGNOSIS — M25611 Stiffness of right shoulder, not elsewhere classified: Secondary | ICD-10-CM | POA: Diagnosis not present

## 2021-09-25 DIAGNOSIS — M25531 Pain in right wrist: Secondary | ICD-10-CM | POA: Diagnosis not present

## 2021-09-25 NOTE — Therapy (Signed)
?OUTPATIENT OCCUPATIONAL THERAPY TREATMENT NOTE ? ? ?Patient Name: Vanessa Mcconnell ?MRN: [id number] ?DOB:10/02/1960, 61 y.o., female ?Today's Date: 09/25/2021 ? ?PCP: Glenda Chroman, MD ?REFERRING PROVIDER: Dr. Arther Abbott ? ? OT End of Session - 09/25/21 0830   ? ? Visit Number 3   ? Number of Visits 12   ? Date for OT Re-Evaluation 10/28/21   ? Authorization Type [employer] Medicare   ? Authorization Time Period $20 copay no visit limit no authorization needed   ? Progress Note Due on Visit 10   ? OT Start Time 0732   ? OT Stop Time 0810   ? OT Time Calculation (min) 38 min   ? Activity Tolerance Patient tolerated treatment well   ? Behavior During Therapy [facility] for tasks assessed/performed   ? ?  ?  ? ?  ? ? ? ?Past Medical History:  ?Diagnosis Date  ? Arthritis   ? Asthma   ? Essential hypertension   ? Fibromyalgia   ? History of migraine   ? Hyperlipidemia   ? Malignant hyperthermia   ? Reportedly at age 20 with tonsillectomy  ? Pancreatitis   ? Reportedly single episode in childhood  ? Small intestinal bacterial overgrowth 12/2015  ? Positive breath test  ? Thoracic disc herniation   ? ?Past Surgical History:  ?Procedure Laterality Date  ? ABDOMINAL HYSTERECTOMY    ? BREAST BIOPSY Left 1990's  ? 3 areas biopsied at different times, all with needle not excisional  ? COLONOSCOPY N/A 02/11/2015  ? NOB:[id number] diverticulosis in the sigmoid colon/moderate internal hemorrhoids  ? DEBRIDEMENT LEG    ? cellulitis debridement as a teen  ? ESOPHAGEAL DILATION  02/11/2015  ? Procedure: ESOPHAGEAL DILATION;  Surgeon: Danie Binder, MD;  [facility];  Service: Endoscopy;;  ? ESOPHAGOGASTRODUODENOSCOPY N/A 02/11/2015  ? [id number] size HH/mild non-erosive gastritis  ? strep infection    ? causing skin infection, debridement buttocks, hip, legs  ? TONSILLECTOMY    ? ULNER NERVE SURGERY    ? Right Elbow  ? ?Patient Active Problem List  ? Diagnosis Date Noted  ? Cervical spine pain 08/11/2021  ? Carpal tunnel syndrome  08/11/2021  ? Chronic pain syndrome 08/11/2021  ? Falls 08/11/2021  ? Foot pain 08/11/2021  ? Low back pain 08/11/2021  ? Primary fibromyalgia syndrome 08/11/2021  ? White matter abnormality on MRI of brain 01/06/2021  ? Bilateral tinnitus 09/18/2020  ? Dizziness and giddiness 09/18/2020  ? Difficulty balancing 09/18/2020  ? Asthma 01/17/2018  ? COPD (chronic obstructive pulmonary disease) ([place]) 01/17/2018  ? Pancreatitis 01/17/2018  ? OSA (obstructive sleep apnea) 01/17/2018  ? Constipation 12/06/2017  ? GERD (gastroesophageal reflux disease) 12/02/2017  ? RUQ pain 12/02/2017  ? Heme positive stool 12/02/2017  ? Chest pain 08/26/2017  ? Essential hypertension 08/26/2017  ? Hyperlipidemia 08/26/2017  ? Hypokalemia 08/26/2017  ? Small intestinal bacterial overgrowth 12/26/2015  ? Dysphagia 12/26/2015  ? Nausea without vomiting 11/04/2015  ? Elevated blood pressure 05/08/2015  ? Bloating 02/05/2015  ? Nausea with vomiting 02/05/2015  ? Hematochezia 02/05/2015  ? ? ?ONSET DATE: 08/05/21 ? ?REFERRING DIAG: T46.568L (ICD-10-CM) - Triquetral chip fracture, ? ?THERAPY DIAG:  ?Other symptoms and signs involving the musculoskeletal system ? ?Stiffness of right shoulder, not elsewhere classified ? ?Stiffness of right wrist, not elsewhere classified ? ?Acute pain of right shoulder ? ?Pain in right wrist ? ? ?PERTINENT HISTORY:  Patient is a 61 y/o female S/P right triquetral chip  fracture which occured from a mechanical fall on 08/05/21. She was placed in a full arm cast for 1.5 weeks then moved to a short arm brace for 5 weeks. ? ? 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 ?SUBJECTIVE: S: I don't know what I did but my hand was hurting really bad yesterday.  ? ?PAIN:  ?Are you having pain? Yes: NPRS scale: 5/10 ?Pain location: right hand ?Pain description: aching, sharp at times ?Aggravating factors: unsure ?Relieving factors: rest, pain medication ? ? ? ? ?OBJECTIVE:  ? ?TODAY'S TREATMENT:  ?-Myofascial release: completed  separately from therapeutic exercises. Myofascial release to right ulnar wrist, upper arm, anterior shoulder, and trapezius regions to decrease pain and fascial restrictions and increase joint ROM.  ?-P/ROM-shoulder flexion, er/IR, horizontal ab/adduction, abduction, 5X each ?-Shoulder A/ROM: protraction, flexion, horizontal abduction, 10X each ?-P/ROM-wrist flexion, extension, radial and ulnar deviation, 5X each ?-Hand gripper: large and medium beads hand gripper at 11#, vertical ?-A/ROM-wrist flexion, extension, radial and ulnar deviation, forearm supination/pronation 10X each ?-Sponges: 10, 11 ?-Thumb flexion: 10X to base of 5th digit ?-Hand: full extension to flat fist, 10X each ? ? ?09/23/21 ?-Myofascial release: completed separately from therapeutic exercises. Myofascial release to right ulnar wrist, upper arm, anterior shoulder, and trapezius regions to decrease pain and fascial restrictions and increase joint ROM.  ?-P/ROM-shoulder flexion, er/IR, horizontal ab/adduction, abduction, 5X each ?-P/ROM-wrist flexion, extension, radial and ulnar deviation, 5X each ?-A/ROM-wrist flexion, extension, radial and ulnar deviation, 10X each ?-Yellow theraputty-rolling into ball, flatten, roll, lateral and 3 point pinch ?-Sponges: 7, 7 ?-Shoulder A/ROM: protraction, flexion, horizontal abduction, 10X each ? ? ? OT Short Term Goals   ? ?  ? OT SHORT TERM GOAL #1  ? Title Pt will be educated and independent with HEP in order to faciliate her progress in therapy and allow for her to return to using her RUE as her dominant extremity.   ? Time 3   ? Period Weeks   ? Status On-going   ? Target Date 10/07/21   ?  ? OT SHORT TERM GOAL #2  ? Title Patient will increase her Right wrist P/ROM to [facility] in order to increase her ability to turn knobs and handles when opening doors.   ? Time 3   ? Period Weeks   ? Status On-going   ? ?  ?  ? ?  ? ? ? OT Long Term Goals   ? ?  ? OT LONG TERM GOAL #1  ? Title Pt will increase her RUE  shoulder and wrist A/ROM to WNL in order to complete all dressing, bathing, and grooming tasks with less difficulty.   ? Time 6   ? Period Weeks   ? Status On-going   ? Target Date 10/28/21   ?  ? OT LONG TERM GOAL #2  ? Title Pt will increase her Right shoulder and wrist strength to 5/5 in order to return to lifting and carrying items of moderate weight (10 or more pounds)   ? Time 6   ? Period Weeks   ? Status On-going   ?  ? OT LONG TERM GOAL #3  ? Title Patient will increase right hand grip strength to 25#, lateral pinch to 12#, and 3 point pinch to 11#  in order to open jars and containers with less dificulty.   ? Time 6   ? Period Weeks   ? Status On-going   ?  ? OT LONG TERM GOAL #4  ?  Title Pt will report a decrease in pain level in her RUE of approximately 3/10 or less when utilizing it to complete daily tasks.   ? Time 6   ? Period Weeks   ? Status On-going   ? ?  ?  ? ?  ? ? ? Plan - 09/25/21 0830   ? ? Clinical Impression Statement A: Pt reports increased hand pain today, unsure of cause. Continued with myofascial release to address fascial restrictions in RUE, passive stretching to wrist and shoulder. Pt completing shoulder and wrist A/ROM, added grip strengthening using hand gripper. Verbal cuing for form and technique, rest breaks provided as needed. Provided x-small 3/4 finger edema glove today to address right hand edema.   ? Body Structure / Function / Physical Skills ADL;UE functional use;Fascial restriction;Pain;ROM;Edema;Mobility;Strength   ? Plan P: Follow up on edema glove wear and results, continue with grip strengthening, add pinch task   ? OT Home Exercise Plan eval: wrist and shoulder stretches; 3/14: yellow theraputty HEP   ? Consulted and Agree with Plan of Care Patient   ? ?  ?  ? ?  ? ? ? ? ?Guadelupe Sabin, OTR/L  ?[phone] ?09/25/2021, 8:34 AM ? ?  ? ? ? ?

## 2021-09-29 ENCOUNTER — Other Ambulatory Visit: Payer: Medicare Other | Admitting: *Deleted

## 2021-09-29 ENCOUNTER — Other Ambulatory Visit: Payer: Self-pay

## 2021-09-29 DIAGNOSIS — I10 Essential (primary) hypertension: Secondary | ICD-10-CM

## 2021-09-29 DIAGNOSIS — Z79899 Other long term (current) drug therapy: Secondary | ICD-10-CM

## 2021-09-29 LAB — BASIC METABOLIC PANEL
BUN/Creatinine Ratio: 15 (ref 12–28)
BUN: 13 mg/dL (ref 8–27)
CO2: 24 mmol/L (ref 20–29)
Calcium: 9.7 mg/dL (ref 8.7–10.3)
Chloride: 101 mmol/L (ref 96–106)
Creatinine, Ser: 0.85 mg/dL (ref 0.57–1.00)
Glucose: 98 mg/dL (ref 70–99)
Potassium: 4.1 mmol/L (ref 3.5–5.2)
Sodium: 138 mmol/L (ref 134–144)
eGFR: 78 mL/min/{1.73_m2} (ref >59)

## 2021-09-30 ENCOUNTER — Ambulatory Visit (HOSPITAL_COMMUNITY): Payer: Medicare Other | Admitting: Occupational Therapy

## 2021-09-30 ENCOUNTER — Encounter (HOSPITAL_COMMUNITY): Payer: Self-pay | Admitting: Occupational Therapy

## 2021-09-30 DIAGNOSIS — R29898 Other symptoms and signs involving the musculoskeletal system: Secondary | ICD-10-CM

## 2021-09-30 DIAGNOSIS — M25511 Pain in right shoulder: Secondary | ICD-10-CM | POA: Diagnosis not present

## 2021-09-30 DIAGNOSIS — M25611 Stiffness of right shoulder, not elsewhere classified: Secondary | ICD-10-CM

## 2021-09-30 DIAGNOSIS — M25531 Pain in right wrist: Secondary | ICD-10-CM | POA: Diagnosis not present

## 2021-09-30 DIAGNOSIS — M25631 Stiffness of right wrist, not elsewhere classified: Secondary | ICD-10-CM

## 2021-09-30 NOTE — Therapy (Signed)
?OUTPATIENT OCCUPATIONAL THERAPY TREATMENT NOTE ? ? ?Patient Name: Vanessa Mcconnell ?MRN: [id number] ?DOB:Jan 12, 1961, 61 y.o., female ?Today's Date: 09/30/2021 ? ?PCP: Glenda Chroman, MD ?REFERRING PROVIDER: Dr. Arther Abbott ? ? OT End of Session - 09/30/21 0801   ? ? Visit Number 4   ? Number of Visits 12   ? Date for OT Re-Evaluation 10/28/21   ? Authorization Type [employer] Medicare   ? Authorization Time Period $20 copay no visit limit no authorization needed   ? Progress Note Due on Visit 10   ? OT Start Time 0730   ? OT Stop Time 0813   ? OT Time Calculation (min) 43 min   ? Activity Tolerance Patient tolerated treatment well   ? Behavior During Therapy [facility] for tasks assessed/performed   ? ?  ?  ? ?  ? ? ? ? ?Past Medical History:  ?Diagnosis Date  ? Arthritis   ? Asthma   ? Essential hypertension   ? Fibromyalgia   ? History of migraine   ? Hyperlipidemia   ? Malignant hyperthermia   ? Reportedly at age 48 with tonsillectomy  ? Pancreatitis   ? Reportedly single episode in childhood  ? Small intestinal bacterial overgrowth 12/2015  ? Positive breath test  ? Thoracic disc herniation   ? ?Past Surgical History:  ?Procedure Laterality Date  ? ABDOMINAL HYSTERECTOMY    ? BREAST BIOPSY Left 1990's  ? 3 areas biopsied at different times, all with needle not excisional  ? COLONOSCOPY N/A 02/11/2015  ? [id number] diverticulosis in the sigmoid colon/moderate internal hemorrhoids  ? DEBRIDEMENT LEG    ? cellulitis debridement as a teen  ? ESOPHAGEAL DILATION  02/11/2015  ? Procedure: ESOPHAGEAL DILATION;  Surgeon: Danie Binder, MD;  [facility];  Service: Endoscopy;;  ? ESOPHAGOGASTRODUODENOSCOPY N/A 02/11/2015  ? [id number] size HH/mild non-erosive gastritis  ? strep infection    ? causing skin infection, debridement buttocks, hip, legs  ? TONSILLECTOMY    ? ULNER NERVE SURGERY    ? Right Elbow  ? ?Patient Active Problem List  ? Diagnosis Date Noted  ? Cervical spine pain 08/11/2021  ? Carpal tunnel syndrome  08/11/2021  ? Chronic pain syndrome 08/11/2021  ? Falls 08/11/2021  ? Foot pain 08/11/2021  ? Low back pain 08/11/2021  ? Primary fibromyalgia syndrome 08/11/2021  ? White matter abnormality on MRI of brain 01/06/2021  ? Bilateral tinnitus 09/18/2020  ? Dizziness and giddiness 09/18/2020  ? Difficulty balancing 09/18/2020  ? Asthma 01/17/2018  ? COPD (chronic obstructive pulmonary disease) ([place]) 01/17/2018  ? Pancreatitis 01/17/2018  ? OSA (obstructive sleep apnea) 01/17/2018  ? Constipation 12/06/2017  ? GERD (gastroesophageal reflux disease) 12/02/2017  ? RUQ pain 12/02/2017  ? Heme positive stool 12/02/2017  ? Chest pain 08/26/2017  ? Essential hypertension 08/26/2017  ? Hyperlipidemia 08/26/2017  ? Hypokalemia 08/26/2017  ? Small intestinal bacterial overgrowth 12/26/2015  ? Dysphagia 12/26/2015  ? Nausea without vomiting 11/04/2015  ? Elevated blood pressure 05/08/2015  ? Bloating 02/05/2015  ? Nausea with vomiting 02/05/2015  ? Hematochezia 02/05/2015  ? ? ?ONSET DATE: 08/05/21 ? ?REFERRING DIAG: V56.433I (ICD-10-CM) - Triquetral chip fracture, ? ?THERAPY DIAG:  ?Other symptoms and signs involving the musculoskeletal system ? ?Stiffness of right shoulder, not elsewhere classified ? ?Stiffness of right wrist, not elsewhere classified ? ?Acute pain of right shoulder ? ?Pain in right wrist ? ? ?PERTINENT HISTORY:  Patient is a 61 y/o female S/P right triquetral  chip fracture which occured from a mechanical fall on 08/05/21. She was placed in a full arm cast for 1.5 weeks then moved to a short arm brace for 5 weeks. ? ? 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 ?SUBJECTIVE: S: If I pick something up my fingers bend and feel like fire.  ? ?PAIN:  ?Are you having pain? Yes: NPRS scale: 3/10 ?Pain location: right hand ?Pain description: aching, sore ?Aggravating factors: unsure ?Relieving factors: rest, pain medication ? ? ? ? ?OBJECTIVE:  ? ?TODAY'S TREATMENT:  ?--Myofascial release: completed separately from  therapeutic exercises. Myofascial release to right ulnar wrist, upper arm, anterior shoulder, and trapezius regions to decrease pain and fascial restrictions and increase joint ROM.  ?-P/ROM-shoulder flexion, er/IR, horizontal ab/adduction, abduction, 5X each ?-P/ROM-wrist flexion, extension, radial and ulnar deviation, 5X each ?-Shoulder A/ROM: protraction, flexion, horizontal abduction, 10X each ?-X to V arms, 10X  ?-Hand gripper: large and medium beads hand gripper at 11#, vertical ?-Grooved pegboard: pt holding pegs in palm and translating to fingertips, then placing into pegboard while maintaining hold on the remaining pegs. Mod difficulty maintaining hold of pegs in palm, dropping several throughout task.  ?-Pinch task: using 3 point pinch and red clothespin, pt stacking 4 towers of 5 sponges. Pt then using lateral pinch to remove and replacing sponges into bucket using green clothespin.  ? ? ?09/25/21 ?-Myofascial release: completed separately from therapeutic exercises. Myofascial release to right ulnar wrist, upper arm, anterior shoulder, and trapezius regions to decrease pain and fascial restrictions and increase joint ROM.  ?-P/ROM-shoulder flexion, er/IR, horizontal ab/adduction, abduction, 5X each ?-Shoulder A/ROM: protraction, flexion, horizontal abduction, 10X each ?-P/ROM-wrist flexion, extension, radial and ulnar deviation, 5X each ?-Hand gripper: large and medium beads hand gripper at 11#, vertical ?-A/ROM-wrist flexion, extension, radial and ulnar deviation, forearm supination/pronation 10X each ?-Sponges: 10, 11 ?-Thumb flexion: 10X to base of 5th digit ?-Hand: full extension to flat fist, 10X each ? ? ?09/23/21 ?-Myofascial release: completed separately from therapeutic exercises. Myofascial release to right ulnar wrist, upper arm, anterior shoulder, and trapezius regions to decrease pain and fascial restrictions and increase joint ROM.  ?-P/ROM-shoulder flexion, er/IR, horizontal ab/adduction,  abduction, 5X each ?-P/ROM-wrist flexion, extension, radial and ulnar deviation, 5X each ?-A/ROM-wrist flexion, extension, radial and ulnar deviation, 10X each ?-Yellow theraputty-rolling into ball, flatten, roll, lateral and 3 point pinch ?-Sponges: 7, 7 ?-Shoulder A/ROM: protraction, flexion, horizontal abduction, 10X each ? ? ? ?OT Education: ?Shoulder A/ROM ?Wrist A/ROM ? ? OT Short Term Goals   ? ?  ? OT SHORT TERM GOAL #1  ? Title Pt will be educated and independent with HEP in order to faciliate her progress in therapy and allow for her to return to using her RUE as her dominant extremity.   ? Time 3   ? Period Weeks   ? Status On-going   ? Target Date 10/07/21   ?  ? OT SHORT TERM GOAL #2  ? Title Patient will increase her Right wrist P/ROM to [facility] in order to increase her ability to turn knobs and handles when opening doors.   ? Time 3   ? Period Weeks   ? Status On-going   ? ?  ?  ? ?  ? ? ? OT Long Term Goals   ? ?  ? OT LONG TERM GOAL #1  ? Title Pt will increase her RUE shoulder and wrist A/ROM to WNL in order to complete all dressing, bathing, and grooming tasks  with less difficulty.   ? Time 6   ? Period Weeks   ? Status On-going   ? Target Date 10/28/21   ?  ? OT LONG TERM GOAL #2  ? Title Pt will increase her Right shoulder and wrist strength to 5/5 in order to return to lifting and carrying items of moderate weight (10 or more pounds)   ? Time 6   ? Period Weeks   ? Status On-going   ?  ? OT LONG TERM GOAL #3  ? Title Patient will increase right hand grip strength to 25#, lateral pinch to 12#, and 3 point pinch to 11#  in order to open jars and containers with less dificulty.   ? Time 6   ? Period Weeks   ? Status On-going   ?  ? OT LONG TERM GOAL #4  ? Title Pt will report a decrease in pain level in her RUE of approximately 3/10 or less when utilizing it to complete daily tasks.   ? Time 6   ? Period Weeks   ? Status On-going   ? ?  ?  ? ?  ? ? ? Plan - 09/30/21 0751   ? ? Clinical Impression  Statement A: Pt reports a lot of pain yesterday when trying to pick up a plastic bowl, she was unable to maintain wrist stability needed for holding and had burning pain in her wrist and hand. The edema glove h

## 2021-09-30 NOTE — Patient Instructions (Signed)
Repeat all exercises 10-15 times, 1-2 times per day. ? ?1) Shoulder Protraction ? ? ? ?Begin with elbows by your side, slowly "punch" straight out in front of you.   ? ? ? ?2) Shoulder Flexion ? ?Standing:  ?      ? ?Begin with arms at your side with thumbs pointed up, slowly raise both arms up and forward towards overhead.  ? ? ? ? ? ? ? ? ? ? ? ? ? ?3) Horizontal abduction/adduction ? ?Standing: ? ?       ? ? ?Begin with arms straight out in front of you, bring out to the side in at "T" shape. Keep arms straight entire time.  ? ? ? ? ? ? ? ? ? ? ? ? ? ? ? ?4) Internal & External Rotation ? ?Standing: ?   ? ?Stand with elbows at the side and elbows bent 90 degrees. Move your forearms away from your body, then bring back inward toward the body.   ? ? ?5) Shoulder Abduction ? ?Standing:  ? ? ?  ? ? Slowly move your arms out to the side so that they go overhead, in a jumping jack or snow angel movement.  ? ? ? ?AROM Exercises  ?*Complete exercises ___10___ times each, ____2___ times per day* ? ?1) Wrist Flexion ? ?Start with wrist at edge of table, palm facing up. With wrist hanging slightly off table, curl wrist upward, and back down. ? ? ? ? ? ?2) Wrist Extension ? ?Start with wrist at edge of table, palm facing down. With wrist slightly off the edge of the table, curl wrist up and back down. ? ? ? ? ? ?3) Radial Deviations ? ?Start with forearm flat against a table, wrist hanging slightly off the edge, and palm facing the wall. Bending at the wrist only, and keeping palm facing the wall, bend wrist so fist is pointing towards the floor, back up to start position, and up towards the ceiling. Return to start. ? ? ? ? ? ? ? ?4) WRIST PRONATION ? ?Turn your forearm towards palm face down. ? ?Keep your elbow bent and by the side of your  Body. ? ? ? ? ? ?5) WRIST SUPINATION ? ?Turn your forearm towards palm face up. ? ?Keep your elbow bent and by the side of your  Body. ? ? ? ? ? ? ? ? ? ? ? ? ? ?

## 2021-10-02 ENCOUNTER — Encounter (HOSPITAL_COMMUNITY): Payer: Medicare Other | Admitting: Occupational Therapy

## 2021-10-02 DIAGNOSIS — Z0000 Encounter for general adult medical examination without abnormal findings: Secondary | ICD-10-CM | POA: Diagnosis not present

## 2021-10-02 DIAGNOSIS — I10 Essential (primary) hypertension: Secondary | ICD-10-CM | POA: Diagnosis not present

## 2021-10-02 DIAGNOSIS — Z299 Encounter for prophylactic measures, unspecified: Secondary | ICD-10-CM | POA: Diagnosis not present

## 2021-10-02 DIAGNOSIS — Z789 Other specified health status: Secondary | ICD-10-CM | POA: Diagnosis not present

## 2021-10-06 ENCOUNTER — Encounter (HOSPITAL_COMMUNITY): Payer: Self-pay | Admitting: Occupational Therapy

## 2021-10-06 ENCOUNTER — Other Ambulatory Visit: Payer: Self-pay

## 2021-10-06 ENCOUNTER — Ambulatory Visit (HOSPITAL_COMMUNITY): Payer: Medicare Other | Admitting: Occupational Therapy

## 2021-10-06 DIAGNOSIS — M25531 Pain in right wrist: Secondary | ICD-10-CM

## 2021-10-06 DIAGNOSIS — M25631 Stiffness of right wrist, not elsewhere classified: Secondary | ICD-10-CM | POA: Diagnosis not present

## 2021-10-06 DIAGNOSIS — M25611 Stiffness of right shoulder, not elsewhere classified: Secondary | ICD-10-CM | POA: Diagnosis not present

## 2021-10-06 DIAGNOSIS — R29898 Other symptoms and signs involving the musculoskeletal system: Secondary | ICD-10-CM | POA: Diagnosis not present

## 2021-10-06 DIAGNOSIS — M25511 Pain in right shoulder: Secondary | ICD-10-CM

## 2021-10-06 NOTE — Therapy (Signed)
?OUTPATIENT OCCUPATIONAL THERAPY TREATMENT NOTE ? ? ?Patient Name: Vanessa Mcconnell ?MRN: [id number] ?DOB:07/10/1961, 61 y.o., female ?Today's Date: 10/06/2021 ? ?PCP: Glenda Chroman, MD ?REFERRING PROVIDER: Dr. Arther Abbott ? ? OT End of Session - 10/06/21 4665   ? ? Visit Number 5   ? Number of Visits 12   ? Date for OT Re-Evaluation 10/28/21   ? Authorization Type [employer] Medicare   ? Authorization Time Period $20 copay no visit limit no authorization needed   ? Progress Note Due on Visit 10   ? OT Start Time [phone]   pt arrived late  ? OT Stop Time 0815   ? OT Time Calculation (min) 36 min   ? Activity Tolerance Patient tolerated treatment well   ? Behavior During Therapy [facility] for tasks assessed/performed   ? ?  ?  ? ?  ? ? ? ? ? ?Past Medical History:  ?Diagnosis Date  ? Arthritis   ? Asthma   ? Essential hypertension   ? Fibromyalgia   ? History of migraine   ? Hyperlipidemia   ? Malignant hyperthermia   ? Reportedly at age 1 with tonsillectomy  ? Pancreatitis   ? Reportedly single episode in childhood  ? Small intestinal bacterial overgrowth 12/2015  ? Positive breath test  ? Thoracic disc herniation   ? ?Past Surgical History:  ?Procedure Laterality Date  ? ABDOMINAL HYSTERECTOMY    ? BREAST BIOPSY Left 1990's  ? 3 areas biopsied at different times, all with needle not excisional  ? COLONOSCOPY N/A 02/11/2015  ? [id number] diverticulosis in the sigmoid colon/moderate internal hemorrhoids  ? DEBRIDEMENT LEG    ? cellulitis debridement as a teen  ? ESOPHAGEAL DILATION  02/11/2015  ? Procedure: ESOPHAGEAL DILATION;  Surgeon: Danie Binder, MD;  Location: [facility];  Service: Endoscopy;;  ? ESOPHAGOGASTRODUODENOSCOPY N/A 02/11/2015  ? SPQ:[id number] size HH/mild non-erosive gastritis  ? strep infection    ? causing skin infection, debridement buttocks, hip, legs  ? TONSILLECTOMY    ? ULNER NERVE SURGERY    ? Right Elbow  ? ?Patient Active Problem List  ? Diagnosis Date Noted  ? Cervical spine pain 08/11/2021  ?  Carpal tunnel syndrome 08/11/2021  ? Chronic pain syndrome 08/11/2021  ? Falls 08/11/2021  ? Foot pain 08/11/2021  ? Low back pain 08/11/2021  ? Primary fibromyalgia syndrome 08/11/2021  ? White matter abnormality on MRI of brain 01/06/2021  ? Bilateral tinnitus 09/18/2020  ? Dizziness and giddiness 09/18/2020  ? Difficulty balancing 09/18/2020  ? Asthma 01/17/2018  ? COPD (chronic obstructive pulmonary disease) ([place]) 01/17/2018  ? Pancreatitis 01/17/2018  ? OSA (obstructive sleep apnea) 01/17/2018  ? Constipation 12/06/2017  ? GERD (gastroesophageal reflux disease) 12/02/2017  ? RUQ pain 12/02/2017  ? Heme positive stool 12/02/2017  ? Chest pain 08/26/2017  ? Essential hypertension 08/26/2017  ? Hyperlipidemia 08/26/2017  ? Hypokalemia 08/26/2017  ? Small intestinal bacterial overgrowth 12/26/2015  ? Dysphagia 12/26/2015  ? Nausea without vomiting 11/04/2015  ? Elevated blood pressure 05/08/2015  ? Bloating 02/05/2015  ? Nausea with vomiting 02/05/2015  ? Hematochezia 02/05/2015  ? ? ?ONSET DATE: 08/05/21 ? ?REFERRING DIAG: Q33.354T (ICD-10-CM) - Triquetral chip fracture, ? ?THERAPY DIAG:  ?Other symptoms and signs involving the musculoskeletal system ? ?Stiffness of right shoulder, not elsewhere classified ? ?Stiffness of right wrist, not elsewhere classified ? ?Acute pain of right shoulder ? ?Pain in right wrist ? ? ?PERTINENT HISTORY:  Patient is a 61  y/o female S/P right triquetral chip fracture which occured from a mechanical fall on 08/05/21. She was placed in a full arm cast for 1.5 weeks then moved to a short arm brace for 5 weeks. ? ? 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 ?SUBJECTIVE: S: If I move a certain way, my neck really twinges.  ? ?PAIN:  ?Are you having pain? Yes: NPRS scale: 4/10 ?Pain location: left side ?Pain description: aching, sore ?Aggravating factors: unsure ?Relieving factors: rest, pain medication ? ? ? ? ?OBJECTIVE:  ? ?TODAY'S TREATMENT:  ?-Shoulder stretches: flexion, abduction,  doorway stretch, IR behind back, 2x10" ?-Ball pass behind back for IR and behind head for er, 10X each using tennis ball ?-Scapular theraband: red-row, extension, retraction, 10X each ?-wrist strengthening: red theraband-flexion, extension, radial deviation, 10X each ?-Hand gripper: large beads horizontal and medium beads vertical and horizontal, hand gripper at 18# ?-Pinch task: using 3 point pinch and green clothespin, pt stacking 4 towers of 5 sponges. Pt then using lateral pinch to remove and replacing sponges into bucket. ? ? ?09/30/21 ?-Myofascial release: completed separately from therapeutic exercises. Myofascial release to right ulnar wrist, upper arm, anterior shoulder, and trapezius regions to decrease pain and fascial restrictions and increase joint ROM.  ?-P/ROM-shoulder flexion, er/IR, horizontal ab/adduction, abduction, 5X each ?-P/ROM-wrist flexion, extension, radial and ulnar deviation, 5X each ?-Shoulder A/ROM: protraction, flexion, horizontal abduction, 10X each ?-X to V arms, 10X  ?-Hand gripper: large and medium beads hand gripper at 11#, vertical ?-Grooved pegboard: pt holding pegs in palm and translating to fingertips, then placing into pegboard while maintaining hold on the remaining pegs. Mod difficulty maintaining hold of pegs in palm, dropping several throughout task.  ?-Pinch task: using 3 point pinch and red clothespin, pt stacking 4 towers of 5 sponges. Pt then using lateral pinch to remove and replacing sponges into bucket using green clothespin.  ? ? ?09/25/21 ?-Myofascial release: completed separately from therapeutic exercises. Myofascial release to right ulnar wrist, upper arm, anterior shoulder, and trapezius regions to decrease pain and fascial restrictions and increase joint ROM.  ?-P/ROM-shoulder flexion, er/IR, horizontal ab/adduction, abduction, 5X each ?-Shoulder A/ROM: protraction, flexion, horizontal abduction, 10X each ?-P/ROM-wrist flexion, extension, radial and ulnar  deviation, 5X each ?-Hand gripper: large and medium beads hand gripper at 11#, vertical ?-A/ROM-wrist flexion, extension, radial and ulnar deviation, forearm supination/pronation 10X each ?-Sponges: 10, 11 ?-Thumb flexion: 10X to base of 5th digit ?-Hand: full extension to flat fist, 10X each ? ? ? ? ? ?OT Education: ? ? ? OT Short Term Goals   ? ?  ? OT SHORT TERM GOAL #1  ? Title Pt will be educated and independent with HEP in order to faciliate her progress in therapy and allow for her to return to using her RUE as her dominant extremity.   ? Time 3   ? Period Weeks   ? Status On-going   ? Target Date 10/07/21   ?  ? OT SHORT TERM GOAL #2  ? Title Patient will increase her Right wrist P/ROM to [facility] in order to increase her ability to turn knobs and handles when opening doors.   ? Time 3   ? Period Weeks   ? Status On-going   ? ?  ?  ? ?  ? ? ? OT Long Term Goals   ? ?  ? OT LONG TERM GOAL #1  ? Title Pt will increase her RUE shoulder and wrist A/ROM to WNL in order  to complete all dressing, bathing, and grooming tasks with less difficulty.   ? Time 6   ? Period Weeks   ? Status On-going   ? Target Date 10/28/21   ?  ? OT LONG TERM GOAL #2  ? Title Pt will increase her Right shoulder and wrist strength to 5/5 in order to return to lifting and carrying items of moderate weight (10 or more pounds)   ? Time 6   ? Period Weeks   ? Status On-going   ?  ? OT LONG TERM GOAL #3  ? Title Patient will increase right hand grip strength to 25#, lateral pinch to 12#, and 3 point pinch to 11#  in order to open jars and containers with less dificulty.   ? Time 6   ? Period Weeks   ? Status On-going   ?  ? OT LONG TERM GOAL #4  ? Title Pt will report a decrease in pain level in her RUE of approximately 3/10 or less when utilizing it to complete daily tasks.   ? Time 6   ? Period Weeks   ? Status On-going   ? ?  ?  ? ?  ? ? ? ? ?Plan   ?  ?  Clinical Impression Statement A: Pt reports occasional neck pain with certain movements,  like twinges. Completed shoulder stretches, ball pass, and scapular theraband. Progressed to wrist strengthening using red theraband, pt reports occasional pain with tasks. Continued with grip strengthening, i

## 2021-10-08 ENCOUNTER — Encounter (HOSPITAL_COMMUNITY): Payer: Medicare Other | Admitting: Occupational Therapy

## 2021-10-09 DIAGNOSIS — E7800 Pure hypercholesterolemia, unspecified: Secondary | ICD-10-CM | POA: Diagnosis not present

## 2021-10-09 DIAGNOSIS — I10 Essential (primary) hypertension: Secondary | ICD-10-CM | POA: Diagnosis not present

## 2021-10-13 ENCOUNTER — Ambulatory Visit: Payer: Medicare Other | Admitting: Orthopedic Surgery

## 2021-10-13 DIAGNOSIS — D485 Neoplasm of uncertain behavior of skin: Secondary | ICD-10-CM | POA: Diagnosis not present

## 2021-10-13 DIAGNOSIS — I10 Essential (primary) hypertension: Secondary | ICD-10-CM | POA: Diagnosis not present

## 2021-10-13 DIAGNOSIS — Z299 Encounter for prophylactic measures, unspecified: Secondary | ICD-10-CM | POA: Diagnosis not present

## 2021-10-13 DIAGNOSIS — J449 Chronic obstructive pulmonary disease, unspecified: Secondary | ICD-10-CM | POA: Diagnosis not present

## 2021-10-13 DIAGNOSIS — S62111D Displaced fracture of triquetrum [cuneiform] bone, right wrist, subsequent encounter for fracture with routine healing: Secondary | ICD-10-CM | POA: Diagnosis not present

## 2021-10-13 DIAGNOSIS — G5601 Carpal tunnel syndrome, right upper limb: Secondary | ICD-10-CM

## 2021-10-13 DIAGNOSIS — G35 Multiple sclerosis: Secondary | ICD-10-CM | POA: Diagnosis not present

## 2021-10-13 NOTE — Progress Notes (Signed)
Chief Complaint  ?Patient presents with  ? Shoulder Pain  ?  Follow up after PT ?LEFT shoulder  ? Elbow Pain  ?  LEFT//follow up after PT  ? ? ?Vanessa Mcconnell had a fall she had a triquetral fracture and had significant symptoms even afterwards was sent for OT she has improved somewhat in terms of her shoulder range of motion but she still having pain in her hand she is dropping things she is having trouble opening doors she is having night pain which requires her to shake her hand or lift her arm over her head ? ?She denies any neck symptoms ? ?She does have MS and thinks this may be contributing to her radial sided wrist pain ? ?I recommended she have a carpal tunnel test and I will call her with results and we will go from there ?

## 2021-10-13 NOTE — Patient Instructions (Signed)
You will have your nerve conduction study done at [facility] ? ?The address is [place] ? ?Dr. Aline Brochure will call you within 48 hours after he receives the results to discuss the findings.  ?

## 2021-10-14 ENCOUNTER — Encounter (HOSPITAL_COMMUNITY): Payer: Medicare Other

## 2021-10-16 ENCOUNTER — Telehealth (HOSPITAL_COMMUNITY): Payer: Self-pay | Admitting: Occupational Therapy

## 2021-10-16 ENCOUNTER — Ambulatory Visit (HOSPITAL_COMMUNITY): Payer: Medicare Other | Admitting: Occupational Therapy

## 2021-10-16 ENCOUNTER — Telehealth: Payer: Self-pay

## 2021-10-16 NOTE — Telephone Encounter (Signed)
S/w patient she wants to cx 4/11-13/2023 -she will return 10/28/21 ?

## 2021-10-16 NOTE — Telephone Encounter (Signed)
Patient called and would like to go ahead and get it set up to have an apt with Dr. Ernestina Patches ?

## 2021-10-21 ENCOUNTER — Encounter (HOSPITAL_COMMUNITY): Payer: Medicare Other | Admitting: Occupational Therapy

## 2021-10-21 DIAGNOSIS — E2839 Other primary ovarian failure: Secondary | ICD-10-CM | POA: Diagnosis not present

## 2021-10-23 ENCOUNTER — Encounter (HOSPITAL_COMMUNITY): Payer: Medicare Other | Admitting: Occupational Therapy

## 2021-10-24 DIAGNOSIS — J449 Chronic obstructive pulmonary disease, unspecified: Secondary | ICD-10-CM | POA: Diagnosis not present

## 2021-10-24 DIAGNOSIS — Z299 Encounter for prophylactic measures, unspecified: Secondary | ICD-10-CM | POA: Diagnosis not present

## 2021-10-24 DIAGNOSIS — M810 Age-related osteoporosis without current pathological fracture: Secondary | ICD-10-CM | POA: Diagnosis not present

## 2021-10-24 DIAGNOSIS — I10 Essential (primary) hypertension: Secondary | ICD-10-CM | POA: Diagnosis not present

## 2021-10-28 ENCOUNTER — Encounter (HOSPITAL_COMMUNITY): Payer: Medicare Other | Admitting: Occupational Therapy

## 2021-10-30 ENCOUNTER — Encounter (HOSPITAL_COMMUNITY): Payer: Medicare Other

## 2021-11-07 ENCOUNTER — Encounter: Payer: Self-pay | Admitting: Physical Medicine and Rehabilitation

## 2021-11-07 ENCOUNTER — Ambulatory Visit: Payer: No Typology Code available for payment source | Admitting: Physical Medicine and Rehabilitation

## 2021-11-07 DIAGNOSIS — R202 Paresthesia of skin: Secondary | ICD-10-CM | POA: Diagnosis not present

## 2021-11-07 NOTE — Progress Notes (Signed)
? ?Vanessa Mcconnell - 61 y.o. female MRN [id number]  Date of birth: [birth year] ? ?Office Visit Note: ?Visit Date: 11/07/2021 ?PCP: Glenda Chroman, MD ?Referred by: Carole Civil, MD ? ?Subjective: ?Chief Complaint  ?Patient presents with  ? Right Hand - Numbness, Pain, Weakness  ? Right Arm - Numbness, Weakness, Pain  ? ?HPI:  Vanessa Mcconnell is a 61 y.o. female who comes in today at the request of Dr. Arther Abbott for electrodiagnostic study of the Right upper extremities.  Patient is Right hand dominant.  Patient reports onset of symptoms after a fall in January where she had multiple fractures.  Fall was secondary to focal seizure history.  She is status post remote right ulnar nerve decompression or transposition.  She also has a history of multiple sclerosis.  No recent electrodiagnostic studies. ? ?ROS Otherwise per HPI. ? ?Assessment & Plan: ?Visit Diagnoses:  ?  ICD-10-CM   ?1. Paresthesia of skin  R20.2 NCV with EMG (electromyography)  ?  ?  ?Plan: Impression: ?Essentially NORMAL electrodiagnostic study of the right upper extremity.   ? ?There is no significant electrodiagnostic evidence of nerve entrapment, brachial plexopathy or cervical radiculopathy.  As you know, purely sensory or demyelinating radiculopathies and chemical radiculitis may not be detected with this particular electrodiagnostic study. **This electrodiagnostic study cannot rule out small fiber polyneuropathy and dysesthesias from central pain syndromes such as MS or stroke or central pain sensitization syndromes such as fibromyalgia.  Myotomal referral pain from trigger points is also not excluded. ? ?Recommendations: ?1.  Follow-up with referring physician. ?2.  Continue current management of symptoms. ? ?Meds & Orders: No orders of the defined types were placed in this encounter. ?  ?Orders Placed This Encounter  ?Procedures  ? NCV with EMG (electromyography)  ?  ?Follow-up: Return in about 2 weeks (around 11/21/2021) for  Arther Abbott, MD.  ? ?Procedures: ?No procedures performed  ? ?EMG & NCV Findings: ?Evaluation of the right median motor nerve showed reduced amplitude (4.3 mV) and decreased conduction velocity (Elbow-Wrist, 49 m/s).  The right ulnar sensory nerve showed reduced amplitude (10.0 ?V).  All remaining nerves (as indicated in the following tables) were within normal limits.   ? ?All examined muscles (as indicated in the following table) showed no evidence of electrical instability.   ? ?Impression: ?Essentially NORMAL electrodiagnostic study of the right upper extremity.   ? ?There is no significant electrodiagnostic evidence of nerve entrapment, brachial plexopathy or cervical radiculopathy.  As you know, purely sensory or demyelinating radiculopathies and chemical radiculitis may not be detected with this particular electrodiagnostic study. **This electrodiagnostic study cannot rule out small fiber polyneuropathy and dysesthesias from central pain syndromes such as MS or stroke or central pain sensitization syndromes such as fibromyalgia.  Myotomal referral pain from trigger points is also not excluded. ? ?Recommendations: ?1.  Follow-up with referring physician. ?2.  Continue current management of symptoms. ? ?___________________________ ?Laurence Spates FAAPMR ?Board Certified, Tax adviser of Physical Medicine and Rehabilitation ? ? ? ?Nerve Conduction Studies ?Anti Sensory Summary Table ? ? Stim Site NR Peak (ms) Norm Peak (ms) P-T Amp (?V) Norm P-T Amp Site1 Site2 Delta-P (ms) Dist (cm) Vel (m/s) Norm Vel (m/s)  ?Right Median Acr Palm Anti Sensory (2nd Digit)  31.3?C  ?Wrist    3.6 <3.6 25.3 >10 Wrist Palm 1.9 0.0    ?Palm    1.7 <2.0 24.7         ?Right Radial Anti Sensory (  Base 1st Digit)  31.6?C  ?Wrist    2.3 <3.1 25.3  Wrist Base 1st Digit 2.3 0.0    ?Right Ulnar Anti Sensory (5th Digit)  31.5?C  ?Wrist    3.2 <3.7 *10.0 >15.0 Wrist 5th Digit 3.2 14.0 44 >38  ?B Elbow    3.3  8.8  B Elbow Wrist 0.1 0.0   >47  ? ?Motor Summary Table ? ? Stim Site NR Onset (ms) Norm Onset (ms) O-P Amp (mV) Norm O-P Amp Site1 Site2 Delta-0 (ms) Dist (cm) Vel (m/s) Norm Vel (m/s)  ?Right Median Motor (Abd Poll Brev)  31.6?C  ?Wrist    3.5 <4.2 *4.3 >5 Elbow Wrist 3.7 18.0 *49 >50  ?Elbow    7.2  4.1         ?Right Ulnar Motor (Abd Dig Min)  31.8?C  ?Wrist    2.7 <4.2 9.3 >3 B Elbow Wrist 3.0 17.5 58 >53  ?B Elbow    5.7  9.6  A Elbow B Elbow 0.9 10.0 111 >53  ?A Elbow    6.6  9.6         ? ?EMG ? ? Side Muscle Nerve Root Ins Act Fibs Psw Amp Dur Poly Recrt Int Fraser Din Comment  ?Right Abd Poll Brev Median C8-T1 Nml Nml Nml Nml Nml 0 Nml Nml   ?Right 1stDorInt Ulnar C8-T1 Nml Nml Nml Nml Nml 0 Nml Nml   ?Right PronatorTeres Median C6-7 Nml Nml Nml Nml Nml 0 Nml Nml   ?Right Biceps Musculocut C5-6 Nml Nml Nml Nml Nml 0 Nml Nml   ?Right Deltoid Axillary C5-6 Nml Nml Nml Nml Nml 0 Nml Nml   ? ? ?Nerve Conduction Studies ?Anti Sensory Left/Right Comparison ? ? Stim Site L Lat (ms) R Lat (ms) L-R Lat (ms) L Amp (?V) R Amp (?V) L-R Amp (%) Site1 Site2 L Vel (m/s) R Vel (m/s) L-R Vel (m/s)  ?Median Acr Palm Anti Sensory (2nd Digit)  31.3?C  ?Wrist  3.6   25.3  Wrist Palm     ?Palm  1.7   24.7        ?Radial Anti Sensory (Base 1st Digit)  31.6?C  ?Wrist  2.3   25.3  Wrist Base 1st Digit     ?Ulnar Anti Sensory (5th Digit)  31.5?C  ?Wrist  3.2   *10.0  Wrist 5th Digit  44   ?B Elbow  3.3   8.8  B Elbow Wrist     ? ?Motor Left/Right Comparison ? ? Stim Site L Lat (ms) R Lat (ms) L-R Lat (ms) L Amp (mV) R Amp (mV) L-R Amp (%) Site1 Site2 L Vel (m/s) R Vel (m/s) L-R Vel (m/s)  ?Median Motor (Abd Poll Brev)  31.6?C  ?Wrist  3.5   *4.3  Elbow Wrist  *49   ?Elbow  7.2   4.1        ?Ulnar Motor (Abd Dig Min)  31.8?C  ?Wrist  2.7   9.3  B Elbow Wrist  58   ?B Elbow  5.7   9.6  A Elbow B Elbow  111   ?A Elbow  6.6   9.6        ? ? ? ?Waveforms: ?    ? ?   ? ?  ? ?Clinical History: ?No specialty comments available.  ? ? ? ?Objective:  VS:  HT:    WT:   BMI:      BP:   HR: bpm  TEMP: ( )  RESP:  ?Physical Exam ?Musculoskeletal:     ?   General: No swelling, tenderness or deformity.  ?   Comments: Inspection reveals no atrophy of the bilateral APB or FDI or hand intrinsics. There is no swelling, color changes, allodynia or dystrophic changes. There is 5 out of 5 strength in the bilateral wrist extension, finger abduction and long finger flexion. There is intact sensation to light touch in all dermatomal and peripheral nerve distributions.  There is a negative Hoffmann's test bilaterally.  ?Skin: ?   General: Skin is warm and dry.  ?   Findings: No erythema or rash.  ?Neurological:  ?   General: No focal deficit present.  ?   Mental Status: She is alert and oriented to person, place, and time.  ?   Motor: No weakness or abnormal muscle tone.  ?   Coordination: Coordination normal.  ?Psychiatric:     ?   Mood and Affect: Mood normal.     ?   Behavior: Behavior normal.  ?  ? ?Imaging: ?No results found. ?

## 2021-11-07 NOTE — Procedures (Signed)
?EMG & NCV Findings: ?Evaluation of the right median motor nerve showed reduced amplitude (4.3 mV) and decreased conduction velocity (Elbow-Wrist, 49 m/s).  The right ulnar sensory nerve showed reduced amplitude (10.0 ?V).  All remaining nerves (as indicated in the following tables) were within normal limits.   ? ?All examined muscles (as indicated in the following table) showed no evidence of electrical instability.   ? ?Impression: ?Essentially NORMAL electrodiagnostic study of the right upper extremity.   ? ?There is no significant electrodiagnostic evidence of nerve entrapment, brachial plexopathy or cervical radiculopathy.  As you know, purely sensory or demyelinating radiculopathies and chemical radiculitis may not be detected with this particular electrodiagnostic study. **This electrodiagnostic study cannot rule out small fiber polyneuropathy and dysesthesias from central pain syndromes such as MS or stroke or central pain sensitization syndromes such as fibromyalgia.  Myotomal referral pain from trigger points is also not excluded. ? ?Recommendations: ?1.  Follow-up with referring physician. ?2.  Continue current management of symptoms. ? ?___________________________ ?Laurence Spates FAAPMR ?Board Certified, Tax adviser of Physical Medicine and Rehabilitation ? ? ? ?Nerve Conduction Studies ?Anti Sensory Summary Table ? ? Stim Site NR Peak (ms) Norm Peak (ms) P-T Amp (?V) Norm P-T Amp Site1 Site2 Delta-P (ms) Dist (cm) Vel (m/s) Norm Vel (m/s)  ?Right Median Acr Palm Anti Sensory (2nd Digit)  31.3?C  ?Wrist    3.6 <3.6 25.3 >10 Wrist Palm 1.9 0.0    ?Palm    1.7 <2.0 24.7         ?Right Radial Anti Sensory (Base 1st Digit)  31.6?C  ?Wrist    2.3 <3.1 25.3  Wrist Base 1st Digit 2.3 0.0    ?Right Ulnar Anti Sensory (5th Digit)  31.5?C  ?Wrist    3.2 <3.7 *10.0 >15.0 Wrist 5th Digit 3.2 14.0 44 >38  ?B Elbow    3.3  8.8  B Elbow Wrist 0.1 0.0  >47  ? ?Motor Summary Table ? ? Stim Site NR Onset (ms) Norm Onset  (ms) O-P Amp (mV) Norm O-P Amp Site1 Site2 Delta-0 (ms) Dist (cm) Vel (m/s) Norm Vel (m/s)  ?Right Median Motor (Abd Poll Brev)  31.6?C  ?Wrist    3.5 <4.2 *4.3 >5 Elbow Wrist 3.7 18.0 *49 >50  ?Elbow    7.2  4.1         ?Right Ulnar Motor (Abd Dig Min)  31.8?C  ?Wrist    2.7 <4.2 9.3 >3 B Elbow Wrist 3.0 17.5 58 >53  ?B Elbow    5.7  9.6  A Elbow B Elbow 0.9 10.0 111 >53  ?A Elbow    6.6  9.6         ? ?EMG ? ? Side Muscle Nerve Root Ins Act Fibs Psw Amp Dur Poly Recrt Int Fraser Din Comment  ?Right Abd Poll Brev Median C8-T1 Nml Nml Nml Nml Nml 0 Nml Nml   ?Right 1stDorInt Ulnar C8-T1 Nml Nml Nml Nml Nml 0 Nml Nml   ?Right PronatorTeres Median C6-7 Nml Nml Nml Nml Nml 0 Nml Nml   ?Right Biceps Musculocut C5-6 Nml Nml Nml Nml Nml 0 Nml Nml   ?Right Deltoid Axillary C5-6 Nml Nml Nml Nml Nml 0 Nml Nml   ? ? ?Nerve Conduction Studies ?Anti Sensory Left/Right Comparison ? ? Stim Site L Lat (ms) R Lat (ms) L-R Lat (ms) L Amp (?V) R Amp (?V) L-R Amp (%) Site1 Site2 L Vel (m/s) R Vel (m/s) L-R Vel (m/s)  ?Median  Acr Palm Anti Sensory (2nd Digit)  31.3?C  ?Wrist  3.6   25.3  Wrist Palm     ?Palm  1.7   24.7        ?Radial Anti Sensory (Base 1st Digit)  31.6?C  ?Wrist  2.3   25.3  Wrist Base 1st Digit     ?Ulnar Anti Sensory (5th Digit)  31.5?C  ?Wrist  3.2   *10.0  Wrist 5th Digit  44   ?B Elbow  3.3   8.8  B Elbow Wrist     ? ?Motor Left/Right Comparison ? ? Stim Site L Lat (ms) R Lat (ms) L-R Lat (ms) L Amp (mV) R Amp (mV) L-R Amp (%) Site1 Site2 L Vel (m/s) R Vel (m/s) L-R Vel (m/s)  ?Median Motor (Abd Poll Brev)  31.6?C  ?Wrist  3.5   *4.3  Elbow Wrist  *49   ?Elbow  7.2   4.1        ?Ulnar Motor (Abd Dig Min)  31.8?C  ?Wrist  2.7   9.3  B Elbow Wrist  58   ?B Elbow  5.7   9.6  A Elbow B Elbow  111   ?A Elbow  6.6   9.6        ? ? ? ?Waveforms: ?    ? ?   ? ? ?

## 2021-11-07 NOTE — Progress Notes (Signed)
Pt state right hand, wrist and arm pain and numbness. Pt state she had an recent fall in January. Pt state she drops thing and her arm and hand falls asleep. Pt state she uses heat and over the counter pain meds to help ease her pain. ? ?Numeric Pain Rating Scale and Functional Assessment ?Average Pain 3 ? ? ?In the last MONTH (on 0-10 scale) has pain interfered with the following? ? ?1. General activity like being  able to carry out your everyday physical activities such as walking, climbing stairs, carrying groceries, or moving a chair?  ?Rating(9) ? ? ? -BT, -Dye Allergies. ? ?

## 2021-11-17 DIAGNOSIS — I10 Essential (primary) hypertension: Secondary | ICD-10-CM | POA: Diagnosis not present

## 2021-11-17 DIAGNOSIS — H5200 Hypermetropia, unspecified eye: Secondary | ICD-10-CM | POA: Diagnosis not present

## 2021-12-03 DIAGNOSIS — R609 Edema, unspecified: Secondary | ICD-10-CM | POA: Diagnosis not present

## 2021-12-03 DIAGNOSIS — I10 Essential (primary) hypertension: Secondary | ICD-10-CM | POA: Diagnosis not present

## 2021-12-03 DIAGNOSIS — Z6834 Body mass index (BMI) 34.0-34.9, adult: Secondary | ICD-10-CM | POA: Diagnosis not present

## 2021-12-03 DIAGNOSIS — J449 Chronic obstructive pulmonary disease, unspecified: Secondary | ICD-10-CM | POA: Diagnosis not present

## 2021-12-03 DIAGNOSIS — Z299 Encounter for prophylactic measures, unspecified: Secondary | ICD-10-CM | POA: Diagnosis not present

## 2021-12-05 DIAGNOSIS — Z0100 Encounter for examination of eyes and vision without abnormal findings: Secondary | ICD-10-CM | POA: Diagnosis not present

## 2021-12-06 NOTE — Progress Notes (Unsigned)
Cardiology Office Note:    Date:  12/09/2021   ID:  Vanessa Mcconnell, DOB April 18, 1961, MRN [id number]  PCP:  Glenda Chroman, MD   [facility] Providers Cardiologist:  Mertie Moores, MD     Referring MD: Glenda Chroman, MD   Chief Complaint: follow-up leg edema   History of Present Illness:    Vanessa Mcconnell is a very pleasant 61 y.o. 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 She takes prazosin for PTSD.   Previously seen by Dr. Domenic Polite in 2018 for intermittent chest discomfort.  She had had recent admission at [facility] which showed normal troponin T levels and normal echocardiogram.  Stress test was ordered which revealed low risk without large ischemic territory to suggest major obstructive CAD, LVEF normal. Echocardiogram from 2018 revealed normal LVEF, mild mitral valve prolapse with mild MR  She was last seen in our office on 09/08/2021 by Dr. Acie Fredrickson for evaluation of leg swelling.  She had been sitting with her in parents who were both in hospice for 11 days and had been eating only take out.  Edema had improved over the month between the death of her parents and office visit.  She was advised to discontinue lisinopril-HCTZ and to resume amlodipine 5 mg and to return in 3 months. She had a seizure and fell in the parking lot and had multiple injuries in January.  Today, she is here alone for follow-up of leg swelling. Has some dyspnea on exertion.  Reports she is recovering from bronchitis for which she took antibiotics and prednisone. PCP started her on HCTZ and potassium supplement for leg edema. She has a history of hypokalemia. Recently joined the gym and is working on improving her diet in order to lose weight. Has to be careful at the gym due to multiple injuries she suffered following a seizure and fall that occurred in January. One episode of palpitations lying in bed on 5/26,  thinks may have been 2/2 recalling the date was 4 months since parents' death. Approximately one week ago, she got dizzy while walking on the treadmill.she was diagnosed with bronchitis the following day. She denies chest pain, fatigue, melena, hematuria, hemoptysis, diaphoresis, weakness, presyncope, syncope, orthopnea, and PND.   Past Medical History:  Diagnosis Date   Arthritis    Asthma    Essential hypertension    Fibromyalgia    History of migraine    Hyperlipidemia    Malignant hyperthermia    Reportedly at age 75 with tonsillectomy   Pancreatitis    Reportedly single episode in childhood   Small intestinal bacterial overgrowth 12/2015   Positive breath test   Thoracic disc herniation     Past Surgical History:  Procedure Laterality Date   ABDOMINAL HYSTERECTOMY     BREAST BIOPSY Left 1990's   3 areas biopsied at different times, all with needle not excisional   COLONOSCOPY N/A 02/11/2015   KAJ:GOTLXBWI diverticulosis in the sigmoid colon/moderate internal hemorrhoids   DEBRIDEMENT LEG     cellulitis debridement as a teen   ESOPHAGEAL DILATION  02/11/2015   Procedure: ESOPHAGEAL DILATION;  Surgeon: Danie Binder, MD;  Location: [facility];  Service: Endoscopy;;   ESOPHAGOGASTRODUODENOSCOPY N/A 02/11/2015   OMB:TDHRCBUL size HH/mild non-erosive gastritis   strep infection     causing skin infection, debridement buttocks, hip, legs   TONSILLECTOMY     ULNER NERVE SURGERY  Right Elbow    Current Medications: Current Meds  Medication Sig   albuterol (PROVENTIL HFA;VENTOLIN HFA) 108 (90 Base) MCG/ACT inhaler Inhale into the lungs every 6 (six) hours as needed for wheezing or shortness of breath.   alendronate (FOSAMAX) 70 MG tablet Take 70 mg by mouth once a week.   amLODipine (NORVASC) 5 MG tablet Take 1 tablet (5 mg total) by mouth daily.   ANORO ELLIPTA 62.5-25 MCG/INH AEPB Inhale 1 puff into the lungs daily.   Cholecalciferol (VITAMIN D3) 125 MCG (5000 UT) TABS  Take by mouth. Per patient taking with Calcium   ciprofloxacin (CIPRO) 500 MG tablet Take 500 mg by mouth 2 (two) times daily. Taking for bronchitis   fluticasone (FLONASE) 50 MCG/ACT nasal spray as needed.   gabapentin (NEURONTIN) 300 MG capsule One po qAM, one po qPM, two po qhs   hydrochlorothiazide (MICROZIDE) 12.5 MG capsule Take 12.5 mg by mouth daily.   levETIRAcetam (KEPPRA) 750 MG tablet Take 750 mg by mouth daily. 2 TABLETS AT BEDTIME   meclizine (ANTIVERT) 25 MG tablet Take 25 mg by mouth 3 (three) times daily as needed.   metoprolol succinate (TOPROL-XL) 25 MG 24 hr tablet Take 25 mg by mouth 2 (two) times daily.   ondansetron (ZOFRAN) 4 MG tablet Take 4 mg by mouth as needed.   pantoprazole (PROTONIX) 40 MG tablet Take 1 tablet (40 mg total) by mouth daily. 30 minutes before breakfast   potassium chloride (MICRO-K) 10 MEQ CR capsule Take 10 mEq by mouth daily.   prazosin (MINIPRESS) 2 MG capsule Take 2 mg by mouth at bedtime and may repeat dose one time if needed.   predniSONE (STERAPRED UNI-PAK 21 TAB) 5 MG (21) TBPK tablet Take by mouth as directed.   vitamin B-12 (CYANOCOBALAMIN) 100 MCG tablet Take 100 mcg by mouth daily. Per patient taking a gummie   vitamin C (ASCORBIC ACID) 500 MG tablet Take 500 mg by mouth daily.   zinc gluconate 50 MG tablet Take 50 mg by mouth daily.     Allergies:   Anesthesia s-i-40 [propofol], Benadryl [diphenhydramine hcl], Erythromycin, Iohexol, Latex, Penicillins, and Tetracyclines & related   Social History   Socioeconomic History   Marital status: Married    Spouse name: Not on file   Number of children: Not on file   Years of education: Not on file   Highest education level: Not on file  Occupational History   Not on file  Tobacco Use   Smoking status: Never   Smokeless tobacco: Never   Tobacco comments:    Never smoked  Vaping Use   Vaping Use: Never used  Substance and Sexual Activity   Alcohol use: No    Alcohol/week: 0.0  standard drinks   Drug use: No   Sexual activity: Not on file  Other Topics Concern   Not on file  Social History Narrative   Right handed   Caffeine use: daily   Social Determinants of Health   Financial Resource Strain: Not on file  Food Insecurity: Not on file  Transportation Needs: Not on file  Physical Activity: Not on file  Stress: Not on file  Social Connections: Not on file     Family History: The patient's family history includes CAD in her sister; Congestive Heart Failure in her sister; Diabetes in her father; Epilepsy in her sister; Heart attack in her father and maternal grandfather; Heart disease in her son and son; Hypertension in her father and mother;  Other in her sister; Peripheral Artery Disease in her sister; Supraventricular tachycardia in her sister. There is no history of Colon cancer or Breast cancer.  ROS:   Please see the history of present illness.   All other systems reviewed and are negative.  Labs/Other Studies Reviewed:    The following studies were reviewed today:  Nuclear Stress 06/30/2017  No diagnostic ST segment changes to indicate ischemia. Small, mild intensity, basal septal defect that is most consistent with variable soft tissue attenuation. No large ischemic territories noted. This is a low risk study. Nuclear stress EF: 61%.   Echo 06/02/2017 Normal LVEF 98-33%, normal diastolic function Normal RV Mild mitral valve prolapse, mild MR  Recent Labs: 09/29/2021: BUN 13; Creatinine, Ser 0.85; Potassium 4.1; Sodium 138  Recent Lipid Panel No results found for: CHOL, TRIG, HDL, CHOLHDL, VLDL, LDLCALC, LDLDIRECT   Risk Assessment/Calculations:       Physical Exam:    VS:  BP 110/80   Pulse 73   Ht '5\' 2"'$  (1.575 m)   Wt 198 lb 9.6 oz (90.1 kg)   SpO2 96%   BMI 36.32 kg/m     Wt Readings from Last 3 Encounters:  12/09/21 198 lb 9.6 oz (90.1 kg)  09/08/21 195 lb 3.2 oz (88.5 kg)  08/11/21 189 lb (85.7 kg)     GEN:  Well  nourished, well developed in no acute distress HEENT: Normal NECK: No JVD; No carotid bruits CARDIAC: RRR, no murmurs, rubs, gallops RESPIRATORY:  Clear to auscultation without rales, wheezing or rhonchi  ABDOMEN: Soft, non-tender, non-distended MUSCULOSKELETAL:  No edema; No deformity. 2+ pedal pulses, equal bilaterally SKIN: Warm and dry NEUROLOGIC:  Alert and oriented x 3 PSYCHIATRIC:  Normal affect   EKG:  EKG is not ordered today.    Diagnoses:    1. Essential hypertension   2. Mitral valve prolapse   3. Bilateral leg edema   4. 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  Was advised to monitor weight on a daily basis which she will start tomorrow due to recently taking prednisone for bronchitis. Feels leg swelling has improved. I do not note significant swelling on exam. She is exercising on a more consistent basis and avoiding high sodium processed foods.  We will check bmet today due to recent addition of HCTZ and potassium.   Hypertension: BP is well-controlled. No medication changes today.   Mild MVP/mild MR: Has some dyspnea on exertion, occasional palpitations and one episode of dizziness. I do not appreciate a significant murmur on exam.  We will recheck echocardiogram to assess valve function since last echo was 2018.   Obesity: She is working on weight loss, heart healthy diet and increasing physical activity. I provided information on the Mediterranean eating plan and encouraged 150 minutes moderate intensity exercise each week.    Disposition:   6 months with Dr. Acie Fredrickson   Medication Adjustments/Labs and Tests Ordered: Current medicines are reviewed at length with the patient today.  Concerns regarding medicines are outlined above.  Orders Placed This Encounter  Procedures   Basic Metabolic Panel (BMET)    ECHOCARDIOGRAM COMPLETE   No orders of the defined types were placed in this encounter.   Patient Instructions  Medication Instructions:   Your physician recommends that you continue on your current  medications as directed. Please refer to the Current Medication list given to you today.   *If you need a refill on your cardiac medications before your next appointment, please call your pharmacy*   Lab Work:  TODAY!!!! BMET  If you have labs (blood work) drawn today and your tests are completely normal, you will receive your results only by: [place] (if you have MyChart) OR A paper copy in the mail If you have any lab test that is abnormal or we need to change your treatment, we will call you to review the results.   Testing/Procedures:  Your physician has requested that you have an echocardiogram. Echocardiography is a painless test that uses sound waves to create images of your heart. It provides your doctor with information about the size and shape of your heart and how well your heart's chambers and valves are working. This procedure takes approximately one hour. There are no restrictions for this procedure.    Follow-Up: At [facility], you and your health needs are our priority.  As part of our continuing mission to provide you with exceptional heart care, we have created designated Provider Care Teams.  These Care Teams include your primary Cardiologist (physician) and Advanced Practice Providers (APPs -  Physician Assistants and Nurse Practitioners) who all work together to provide you with the care you need, when you need it.  We recommend signing up for the patient portal called "MyChart".  Sign up information is provided on this After Visit Summary.  MyChart is used to connect with patients for Virtual Visits (Telemedicine).  Patients are able to view lab/test results, encounter notes, upcoming appointments, etc.  Non-urgent messages can be sent to your provider as  well.   To learn more about what you can do with MyChart, go to [email or web address].    Your next appointment:   6 month(s)  The format for your next appointment:   In Person  Provider:   Dr. Grayland Jack  If primary card or EP is not listed click here to update    :1}    Other Instructions  Mediterranean Diet A Mediterranean diet refers to food and lifestyle choices that are based on the traditions of countries located on the [employer]. It focuses on eating more fruits, vegetables, whole grains, beans, nuts, seeds, and heart-healthy fats, and eating less dairy, meat, eggs, and processed foods with added sugar, salt, and fat. This way of eating has been shown to help prevent certain conditions and improve outcomes for people who have chronic diseases, like kidney disease and heart disease. What are tips for following this plan? Reading food labels Check the serving size of packaged foods. For foods such as rice and pasta, the serving size refers to the amount of cooked product, not dry. Check the total fat in packaged foods. Avoid foods that have saturated fat or trans fats. Check the ingredient list for added sugars, such as corn syrup. Shopping  Buy a variety of foods that offer a balanced diet, including: Fresh fruits and vegetables (produce). Grains, beans, nuts, and seeds. Some of these may be available in unpackaged forms or large amounts (in bulk). Fresh seafood. Poultry and eggs. Low-fat dairy products. Buy whole ingredients instead of prepackaged foods. Buy fresh fruits and vegetables in-season from local farmers markets. Buy plain frozen fruits and vegetables. If you do not have access to quality fresh seafood, buy precooked frozen shrimp or canned fish, such as tuna, salmon, or sardines. Stock Conservator, museum/gallery  so you always have certain foods on hand, such as olive oil, canned tuna, canned tomatoes, rice, pasta, and beans. Cooking Cook foods with extra-virgin  olive oil instead of using butter or other vegetable oils. Have meat as a side dish, and have vegetables or grains as your main dish. This means having meat in small portions or adding small amounts of meat to foods like pasta or stew. Use beans or vegetables instead of meat in common dishes like chili or lasagna. Experiment with different cooking methods. Try roasting, broiling, steaming, and sauting vegetables. Add frozen vegetables to soups, stews, pasta, or rice. Add nuts or seeds for added healthy fats and plant protein at each meal. You can add these to yogurt, salads, or vegetable dishes. Marinate fish or vegetables using olive oil, lemon juice, garlic, and fresh herbs. Meal planning Plan to eat one vegetarian meal one day each week. Try to work up to two vegetarian meals, if possible. Eat seafood two or more times a week. Have healthy snacks readily available, such as: Vegetable sticks with hummus. Greek yogurt. Fruit and nut trail mix. Eat balanced meals throughout the week. This includes: Fruit: 2-3 servings a day. Vegetables: 4-5 servings a day. Low-fat dairy: 2 servings a day. Fish, poultry, or lean meat: 1 serving a day. Beans and legumes: 2 or more servings a week. Nuts and seeds: 1-2 servings a day. Whole grains: 6-8 servings a day. Extra-virgin olive oil: 3-4 servings a day. Limit red meat and sweets to only a few servings a month. Lifestyle  Cook and eat meals together with your family, when possible. Drink enough fluid to keep your urine pale yellow. Be physically active every day. This includes: Aerobic exercise like running or swimming. Leisure activities like gardening, walking, or housework. Get 7-8 hours of sleep each night. If recommended by your health care provider, drink red wine in moderation. This means 1 glass a day for nonpregnant women and 2 glasses a day for men. A glass of wine equals 5 oz (150 mL). What foods should I eat? Fruits Apples.  Apricots. Avocado. Berries. Bananas. Cherries. Dates. Figs. Grapes. Lemons. Melon. Oranges. Peaches. Plums. Pomegranate. Vegetables Artichokes. Beets. Broccoli. Cabbage. Carrots. Eggplant. Green beans. Chard. Kale. Spinach. Onions. Leeks. Peas. Squash. Tomatoes. Peppers. Radishes. Grains Whole-grain pasta. Brown rice. Bulgur wheat. Polenta. Couscous. Whole-wheat bread. [place]. Meats and other proteins Beans. Almonds. Sunflower seeds. Pine nuts. Peanuts. [place]. Salmon. Scallops. Shrimp. [place]. Tilapia. Clams. Oysters. Eggs. Poultry without skin. Dairy Low-fat milk. Cheese. Greek yogurt. Fats and oils Extra-virgin olive oil. Avocado oil. Grapeseed oil. Beverages Water. Red wine. Herbal tea. Sweets and desserts Greek yogurt with honey. Baked apples. Poached pears. Trail mix. Seasonings and condiments Basil. Cilantro. Coriander. Cumin. Mint. Parsley. Sage. Rosemary. Tarragon. Garlic. Oregano. Thyme. Pepper. Balsamic vinegar. Tahini. Hummus. Tomato sauce. Olives. Mushrooms. The items listed above may not be a complete list of foods and beverages you can eat. Contact a dietitian for more information. What foods should I limit? This is a list of foods that should be eaten rarely or only on special occasions. Fruits Fruit canned in syrup. Vegetables Deep-fried potatoes (french fries). Grains Prepackaged pasta or rice dishes. Prepackaged cereal with added sugar. Prepackaged snacks with added sugar. Meats and other proteins Beef. Pork. Lamb. Poultry with skin. Hot dogs. Berniece Salines. Dairy Ice cream. Sour cream. Whole milk. Fats and oils Butter. Canola oil. Vegetable oil. Beef fat (tallow). Lard. Beverages Juice. Sugar-sweetened soft drinks. Beer. Liquor and spirits. Sweets and desserts Cookies.  Cakes. Pies. Candy. Seasonings and condiments Mayonnaise. Pre-made sauces and marinades. The items listed above may not be a complete list of foods and beverages you should limit. Contact a  dietitian for more information. Summary The Mediterranean diet includes both food and lifestyle choices. Eat a variety of fresh fruits and vegetables, beans, nuts, seeds, and whole grains. Limit the amount of red meat and sweets that you eat. If recommended by your health care provider, drink red wine in moderation. This means 1 glass a day for nonpregnant women and 2 glasses a day for men. A glass of wine equals 5 oz (150 mL). This information is not intended to replace advice given to you by your health care provider. Make sure you discuss any questions you have with your health care provider. Document Revised: 08/04/2019 Document Reviewed: 06/01/2019 Elsevier Patient Education  [place]         Signed, Emmaline Life, NP  12/09/2021 [facility]

## 2021-12-09 ENCOUNTER — Encounter: Payer: Self-pay | Admitting: Nurse Practitioner

## 2021-12-09 ENCOUNTER — Ambulatory Visit (INDEPENDENT_AMBULATORY_CARE_PROVIDER_SITE_OTHER): Payer: No Typology Code available for payment source | Admitting: Nurse Practitioner

## 2021-12-09 VITALS — BP 110/80 | HR 73 | Ht 62.0 in | Wt 198.6 lb

## 2021-12-09 DIAGNOSIS — E661 Drug-induced obesity: Secondary | ICD-10-CM | POA: Diagnosis not present

## 2021-12-09 DIAGNOSIS — R600 Localized edema: Secondary | ICD-10-CM | POA: Diagnosis not present

## 2021-12-09 DIAGNOSIS — I341 Nonrheumatic mitral (valve) prolapse: Secondary | ICD-10-CM | POA: Diagnosis not present

## 2021-12-09 DIAGNOSIS — Z6836 Body mass index (BMI) 36.0-36.9, adult: Secondary | ICD-10-CM

## 2021-12-09 DIAGNOSIS — I10 Essential (primary) hypertension: Secondary | ICD-10-CM

## 2021-12-09 LAB — BASIC METABOLIC PANEL
BUN/Creatinine Ratio: 14 (ref 12–28)
BUN: 13 mg/dL (ref 8–27)
CO2: 24 mmol/L (ref 20–29)
Calcium: 9.6 mg/dL (ref 8.7–10.3)
Chloride: 100 mmol/L (ref 96–106)
Creatinine, Ser: 0.95 mg/dL (ref 0.57–1.00)
Glucose: 76 mg/dL (ref 70–99)
Potassium: 3.8 mmol/L (ref 3.5–5.2)
Sodium: 139 mmol/L (ref 134–144)
eGFR: 68 mL/min/{1.73_m2} (ref >59)

## 2021-12-09 NOTE — Patient Instructions (Signed)
Medication Instructions:   Your physician recommends that you continue on your current medications as directed. Please refer to the Current Medication list given to you today.   *If you need a refill on your cardiac medications before your next appointment, please call your pharmacy*   Lab Work:  TODAY!!!! BMET  If you have labs (blood work) drawn today and your tests are completely normal, you will receive your results only by: [place] (if you have MyChart) OR A paper copy in the mail If you have any lab test that is abnormal or we need to change your treatment, we will call you to review the results.   Testing/Procedures:  Your physician has requested that you have an echocardiogram. Echocardiography is a painless test that uses sound waves to create images of your heart. It provides your doctor with information about the size and shape of your heart and how well your heart's chambers and valves are working. This procedure takes approximately one hour. There are no restrictions for this procedure.    Follow-Up: At [facility], you and your health needs are our priority.  As part of our continuing mission to provide you with exceptional heart care, we have created designated Provider Care Teams.  These Care Teams include your primary Cardiologist (physician) and Advanced Practice Providers (APPs -  Physician Assistants and Nurse Practitioners) who all work together to provide you with the care you need, when you need it.  We recommend signing up for the patient portal called "MyChart".  Sign up information is provided on this After Visit Summary.  MyChart is used to connect with patients for Virtual Visits (Telemedicine).  Patients are able to view lab/test results, encounter notes, upcoming appointments, etc.  Non-urgent messages can be sent to your provider as well.   To learn more about what you can do with MyChart, go to [email or web address].    Your next appointment:    6 month(s)  The format for your next appointment:   In Person  Provider:   Dr. Grayland Jack  If primary card or EP is not listed click here to update    :1}    Other Instructions  Mediterranean Diet A Mediterranean diet refers to food and lifestyle choices that are based on the traditions of countries located on the [employer]. It focuses on eating more fruits, vegetables, whole grains, beans, nuts, seeds, and heart-healthy fats, and eating less dairy, meat, eggs, and processed foods with added sugar, salt, and fat. This way of eating has been shown to help prevent certain conditions and improve outcomes for people who have chronic diseases, like kidney disease and heart disease. What are tips for following this plan? Reading food labels Check the serving size of packaged foods. For foods such as rice and pasta, the serving size refers to the amount of cooked product, not dry. Check the total fat in packaged foods. Avoid foods that have saturated fat or trans fats. Check the ingredient list for added sugars, such as corn syrup. Shopping  Buy a variety of foods that offer a balanced diet, including: Fresh fruits and vegetables (produce). Grains, beans, nuts, and seeds. Some of these may be available in unpackaged forms or large amounts (in bulk). Fresh seafood. Poultry and eggs. Low-fat dairy products. Buy whole ingredients instead of prepackaged foods. Buy fresh fruits and vegetables in-season from local farmers markets. Buy plain frozen fruits and vegetables. If you do not have access to quality fresh seafood, buy precooked frozen  shrimp or canned fish, such as tuna, salmon, or sardines. Stock your pantry so you always have certain foods on hand, such as olive oil, canned tuna, canned tomatoes, rice, pasta, and beans. Cooking Cook foods with extra-virgin olive oil instead of using butter or other vegetable oils. Have meat as a side dish, and have vegetables or grains  as your main dish. This means having meat in small portions or adding small amounts of meat to foods like pasta or stew. Use beans or vegetables instead of meat in common dishes like chili or lasagna. Experiment with different cooking methods. Try roasting, broiling, steaming, and sauting vegetables. Add frozen vegetables to soups, stews, pasta, or rice. Add nuts or seeds for added healthy fats and plant protein at each meal. You can add these to yogurt, salads, or vegetable dishes. Marinate fish or vegetables using olive oil, lemon juice, garlic, and fresh herbs. Meal planning Plan to eat one vegetarian meal one day each week. Try to work up to two vegetarian meals, if possible. Eat seafood two or more times a week. Have healthy snacks readily available, such as: Vegetable sticks with hummus. Greek yogurt. Fruit and nut trail mix. Eat balanced meals throughout the week. This includes: Fruit: 2-3 servings a day. Vegetables: 4-5 servings a day. Low-fat dairy: 2 servings a day. Fish, poultry, or lean meat: 1 serving a day. Beans and legumes: 2 or more servings a week. Nuts and seeds: 1-2 servings a day. Whole grains: 6-8 servings a day. Extra-virgin olive oil: 3-4 servings a day. Limit red meat and sweets to only a few servings a month. Lifestyle  Cook and eat meals together with your family, when possible. Drink enough fluid to keep your urine pale yellow. Be physically active every day. This includes: Aerobic exercise like running or swimming. Leisure activities like gardening, walking, or housework. Get 7-8 hours of sleep each night. If recommended by your health care provider, drink red wine in moderation. This means 1 glass a day for nonpregnant women and 2 glasses a day for men. A glass of wine equals 5 oz (150 mL). What foods should I eat? Fruits Apples. Apricots. Avocado. Berries. Bananas. Cherries. Dates. Figs. Grapes. Lemons. Melon. Oranges. Peaches. Plums.  Pomegranate. Vegetables Artichokes. Beets. Broccoli. Cabbage. Carrots. Eggplant. Green beans. Chard. Kale. Spinach. Onions. Leeks. Peas. Squash. Tomatoes. Peppers. Radishes. Grains Whole-grain pasta. Brown rice. Bulgur wheat. Polenta. Couscous. Whole-wheat bread. [place]. Meats and other proteins Beans. Almonds. Sunflower seeds. Pine nuts. Peanuts. [place]. Salmon. Scallops. Shrimp. [place]. Tilapia. Clams. Oysters. Eggs. Poultry without skin. Dairy Low-fat milk. Cheese. Greek yogurt. Fats and oils Extra-virgin olive oil. Avocado oil. Grapeseed oil. Beverages Water. Red wine. Herbal tea. Sweets and desserts Greek yogurt with honey. Baked apples. Poached pears. Trail mix. Seasonings and condiments Basil. Cilantro. Coriander. Cumin. Mint. Parsley. Sage. Rosemary. Tarragon. Garlic. Oregano. Thyme. Pepper. Balsamic vinegar. Tahini. Hummus. Tomato sauce. Olives. Mushrooms. The items listed above may not be a complete list of foods and beverages you can eat. Contact a dietitian for more information. What foods should I limit? This is a list of foods that should be eaten rarely or only on special occasions. Fruits Fruit canned in syrup. Vegetables Deep-fried potatoes (french fries). Grains Prepackaged pasta or rice dishes. Prepackaged cereal with added sugar. Prepackaged snacks with added sugar. Meats and other proteins Beef. Pork. Lamb. Poultry with skin. Hot dogs. [place] Salines. Dairy Ice cream. Sour cream. Whole milk. Fats and oils Butter. Canola oil. Vegetable oil. Beef fat (tallow). Lard.  Beverages Juice. Sugar-sweetened soft drinks. Beer. Liquor and spirits. Sweets and desserts Cookies. Cakes. Pies. Candy. Seasonings and condiments Mayonnaise. Pre-made sauces and marinades. The items listed above may not be a complete list of foods and beverages you should limit. Contact a dietitian for more information. Summary The Mediterranean diet includes both food and lifestyle choices. Eat a  variety of fresh fruits and vegetables, beans, nuts, seeds, and whole grains. Limit the amount of red meat and sweets that you eat. If recommended by your health care provider, drink red wine in moderation. This means 1 glass a day for nonpregnant women and 2 glasses a day for men. A glass of wine equals 5 oz (150 mL). This information is not intended to replace advice given to you by your health care provider. Make sure you discuss any questions you have with your health care provider. Document Revised: 08/04/2019 Document Reviewed: 06/01/2019 Elsevier Patient Education  [place]

## 2021-12-10 DIAGNOSIS — M25569 Pain in unspecified knee: Secondary | ICD-10-CM | POA: Diagnosis not present

## 2021-12-10 DIAGNOSIS — M79673 Pain in unspecified foot: Secondary | ICD-10-CM | POA: Diagnosis not present

## 2021-12-10 DIAGNOSIS — R296 Repeated falls: Secondary | ICD-10-CM | POA: Diagnosis not present

## 2021-12-10 DIAGNOSIS — Z79899 Other long term (current) drug therapy: Secondary | ICD-10-CM | POA: Diagnosis not present

## 2021-12-10 DIAGNOSIS — R42 Dizziness and giddiness: Secondary | ICD-10-CM | POA: Diagnosis not present

## 2021-12-10 DIAGNOSIS — G894 Chronic pain syndrome: Secondary | ICD-10-CM | POA: Diagnosis not present

## 2021-12-10 DIAGNOSIS — R569 Unspecified convulsions: Secondary | ICD-10-CM | POA: Diagnosis not present

## 2021-12-10 DIAGNOSIS — G5603 Carpal tunnel syndrome, bilateral upper limbs: Secondary | ICD-10-CM | POA: Diagnosis not present

## 2021-12-10 DIAGNOSIS — M542 Cervicalgia: Secondary | ICD-10-CM | POA: Diagnosis not present

## 2021-12-10 DIAGNOSIS — M545 Low back pain, unspecified: Secondary | ICD-10-CM | POA: Diagnosis not present

## 2021-12-24 ENCOUNTER — Ambulatory Visit
Admission: RE | Admit: 2021-12-24 | Discharge: 2021-12-24 | Disposition: A | Discharge status: Home / Self Care | Payer: No Typology Code available for payment source | Source: Ambulatory Visit | Attending: Internal Medicine | Admitting: Internal Medicine

## 2021-12-24 DIAGNOSIS — Z1231 Encounter for screening mammogram for malignant neoplasm of breast: Secondary | ICD-10-CM

## 2021-12-30 ENCOUNTER — Telehealth: Payer: Self-pay | Admitting: Cardiovascular Disease

## 2021-12-30 ENCOUNTER — Ambulatory Visit (HOSPITAL_COMMUNITY): Payer: No Typology Code available for payment source | Attending: Internal Medicine

## 2021-12-30 DIAGNOSIS — I341 Nonrheumatic mitral (valve) prolapse: Secondary | ICD-10-CM | POA: Insufficient documentation

## 2021-12-30 DIAGNOSIS — I10 Essential (primary) hypertension: Secondary | ICD-10-CM | POA: Insufficient documentation

## 2021-12-30 LAB — ECHOCARDIOGRAM COMPLETE
Area-P 1/2: 2.75 cm2
S' Lateral: 3.1 cm

## 2021-12-30 NOTE — Telephone Encounter (Signed)
Patient is returning the multiple calls received from our office today regarding her Echo results. Please advise.

## 2021-12-30 NOTE — Telephone Encounter (Signed)
Left detailed message for patient regarding echo results.  Per Christen Bame, NP: Mitral valve is normal in structure, no evidence of prolapse. Trivial amount of leakiness. Pumping function is normal. Mild left ventricular hypertrophy and impaired relaxation. Would recommend increasing exercise as tolerated, weight loss, good blood pressure control, and low sodium diet (less than 2000 mg daily). Continue current medications.  Provided office number for callback if any questions.

## 2022-01-02 DIAGNOSIS — Z0000 Encounter for general adult medical examination without abnormal findings: Secondary | ICD-10-CM | POA: Diagnosis not present

## 2022-01-02 DIAGNOSIS — Z6833 Body mass index (BMI) 33.0-33.9, adult: Secondary | ICD-10-CM | POA: Diagnosis not present

## 2022-01-02 DIAGNOSIS — M62838 Other muscle spasm: Secondary | ICD-10-CM | POA: Diagnosis not present

## 2022-01-02 DIAGNOSIS — Z79899 Other long term (current) drug therapy: Secondary | ICD-10-CM | POA: Diagnosis not present

## 2022-01-02 DIAGNOSIS — R5383 Other fatigue: Secondary | ICD-10-CM | POA: Diagnosis not present

## 2022-01-02 DIAGNOSIS — E7800 Pure hypercholesterolemia, unspecified: Secondary | ICD-10-CM | POA: Diagnosis not present

## 2022-01-02 DIAGNOSIS — I10 Essential (primary) hypertension: Secondary | ICD-10-CM | POA: Diagnosis not present

## 2022-01-02 DIAGNOSIS — Z789 Other specified health status: Secondary | ICD-10-CM | POA: Diagnosis not present

## 2022-01-02 DIAGNOSIS — Z299 Encounter for prophylactic measures, unspecified: Secondary | ICD-10-CM | POA: Diagnosis not present

## 2022-01-21 DIAGNOSIS — M545 Low back pain, unspecified: Secondary | ICD-10-CM | POA: Diagnosis not present

## 2022-01-21 DIAGNOSIS — R296 Repeated falls: Secondary | ICD-10-CM | POA: Diagnosis not present

## 2022-01-21 DIAGNOSIS — M542 Cervicalgia: Secondary | ICD-10-CM | POA: Diagnosis not present

## 2022-01-21 DIAGNOSIS — Z79891 Long term (current) use of opiate analgesic: Secondary | ICD-10-CM | POA: Diagnosis not present

## 2022-01-21 DIAGNOSIS — G894 Chronic pain syndrome: Secondary | ICD-10-CM | POA: Diagnosis not present

## 2022-01-21 DIAGNOSIS — G35 Multiple sclerosis: Secondary | ICD-10-CM | POA: Diagnosis not present

## 2022-01-21 DIAGNOSIS — G4700 Insomnia, unspecified: Secondary | ICD-10-CM | POA: Diagnosis not present

## 2022-01-21 DIAGNOSIS — R569 Unspecified convulsions: Secondary | ICD-10-CM | POA: Diagnosis not present

## 2022-01-21 DIAGNOSIS — F515 Nightmare disorder: Secondary | ICD-10-CM | POA: Diagnosis not present

## 2022-01-21 DIAGNOSIS — M79673 Pain in unspecified foot: Secondary | ICD-10-CM | POA: Diagnosis not present

## 2022-01-21 DIAGNOSIS — R42 Dizziness and giddiness: Secondary | ICD-10-CM | POA: Diagnosis not present

## 2022-01-21 DIAGNOSIS — M25569 Pain in unspecified knee: Secondary | ICD-10-CM | POA: Diagnosis not present

## 2022-01-21 DIAGNOSIS — G5603 Carpal tunnel syndrome, bilateral upper limbs: Secondary | ICD-10-CM | POA: Diagnosis not present

## 2022-02-03 DIAGNOSIS — J449 Chronic obstructive pulmonary disease, unspecified: Secondary | ICD-10-CM | POA: Diagnosis not present

## 2022-02-03 DIAGNOSIS — B029 Zoster without complications: Secondary | ICD-10-CM | POA: Diagnosis not present

## 2022-02-03 DIAGNOSIS — G319 Degenerative disease of nervous system, unspecified: Secondary | ICD-10-CM | POA: Diagnosis not present

## 2022-02-03 DIAGNOSIS — G35 Multiple sclerosis: Secondary | ICD-10-CM | POA: Diagnosis not present

## 2022-02-03 DIAGNOSIS — Z299 Encounter for prophylactic measures, unspecified: Secondary | ICD-10-CM | POA: Diagnosis not present

## 2022-02-04 ENCOUNTER — Other Ambulatory Visit: Payer: Self-pay | Admitting: Neurology

## 2022-02-04 ENCOUNTER — Other Ambulatory Visit (HOSPITAL_COMMUNITY): Payer: Self-pay | Admitting: Neurology

## 2022-02-04 DIAGNOSIS — G35 Multiple sclerosis: Secondary | ICD-10-CM

## 2022-02-23 ENCOUNTER — Ambulatory Visit (HOSPITAL_COMMUNITY)
Admission: RE | Admit: 2022-02-23 | Discharge: 2022-02-23 | Disposition: A | Discharge status: Home / Self Care | Payer: No Typology Code available for payment source | Source: Ambulatory Visit | Attending: Neurology | Admitting: Neurology

## 2022-02-23 DIAGNOSIS — Z6833 Body mass index (BMI) 33.0-33.9, adult: Secondary | ICD-10-CM | POA: Diagnosis not present

## 2022-02-23 DIAGNOSIS — D692 Other nonthrombocytopenic purpura: Secondary | ICD-10-CM | POA: Diagnosis not present

## 2022-02-23 DIAGNOSIS — L98499 Non-pressure chronic ulcer of skin of other sites with unspecified severity: Secondary | ICD-10-CM | POA: Diagnosis not present

## 2022-02-23 DIAGNOSIS — Z299 Encounter for prophylactic measures, unspecified: Secondary | ICD-10-CM | POA: Diagnosis not present

## 2022-02-23 DIAGNOSIS — G35 Multiple sclerosis: Secondary | ICD-10-CM | POA: Diagnosis not present

## 2022-02-23 DIAGNOSIS — I10 Essential (primary) hypertension: Secondary | ICD-10-CM | POA: Diagnosis not present

## 2022-02-23 DIAGNOSIS — L03115 Cellulitis of right lower limb: Secondary | ICD-10-CM | POA: Diagnosis not present

## 2022-02-23 MED ORDER — GADOBUTROL 1 MMOL/ML IV SOLN
7.0000 mL | Freq: Once | INTRAVENOUS | Status: AC | PRN
  Administered 2022-02-23: 7 mL via INTRAVENOUS

## 2022-03-02 DIAGNOSIS — I10 Essential (primary) hypertension: Secondary | ICD-10-CM | POA: Diagnosis not present

## 2022-03-02 DIAGNOSIS — G35 Multiple sclerosis: Secondary | ICD-10-CM | POA: Diagnosis not present

## 2022-03-02 DIAGNOSIS — L0390 Cellulitis, unspecified: Secondary | ICD-10-CM | POA: Diagnosis not present

## 2022-03-02 DIAGNOSIS — Z299 Encounter for prophylactic measures, unspecified: Secondary | ICD-10-CM | POA: Diagnosis not present

## 2022-03-02 DIAGNOSIS — J449 Chronic obstructive pulmonary disease, unspecified: Secondary | ICD-10-CM | POA: Diagnosis not present

## 2022-03-12 DIAGNOSIS — K219 Gastro-esophageal reflux disease without esophagitis: Secondary | ICD-10-CM | POA: Diagnosis not present

## 2022-03-12 DIAGNOSIS — I10 Essential (primary) hypertension: Secondary | ICD-10-CM | POA: Diagnosis not present

## 2022-03-17 DIAGNOSIS — R42 Dizziness and giddiness: Secondary | ICD-10-CM | POA: Diagnosis not present

## 2022-03-17 DIAGNOSIS — M79673 Pain in unspecified foot: Secondary | ICD-10-CM | POA: Diagnosis not present

## 2022-03-17 DIAGNOSIS — R296 Repeated falls: Secondary | ICD-10-CM | POA: Diagnosis not present

## 2022-03-17 DIAGNOSIS — G4700 Insomnia, unspecified: Secondary | ICD-10-CM | POA: Diagnosis not present

## 2022-03-17 DIAGNOSIS — M25569 Pain in unspecified knee: Secondary | ICD-10-CM | POA: Diagnosis not present

## 2022-03-17 DIAGNOSIS — G894 Chronic pain syndrome: Secondary | ICD-10-CM | POA: Diagnosis not present

## 2022-03-17 DIAGNOSIS — M542 Cervicalgia: Secondary | ICD-10-CM | POA: Diagnosis not present

## 2022-03-17 DIAGNOSIS — G5603 Carpal tunnel syndrome, bilateral upper limbs: Secondary | ICD-10-CM | POA: Diagnosis not present

## 2022-03-17 DIAGNOSIS — R569 Unspecified convulsions: Secondary | ICD-10-CM | POA: Diagnosis not present

## 2022-03-17 DIAGNOSIS — F515 Nightmare disorder: Secondary | ICD-10-CM | POA: Diagnosis not present

## 2022-03-17 DIAGNOSIS — G35 Multiple sclerosis: Secondary | ICD-10-CM | POA: Diagnosis not present

## 2022-03-17 DIAGNOSIS — M545 Low back pain, unspecified: Secondary | ICD-10-CM | POA: Diagnosis not present

## 2022-05-21 ENCOUNTER — Telehealth: Payer: Self-pay | Admitting: Orthopedic Surgery

## 2022-05-21 NOTE — Telephone Encounter (Signed)
Patient called, lvm yesterday that she would like to be seen for her left leg/hip.  She fell back in January and she feels the pain is stimulating from that.  She has not seen anyone for this and has not had any x-rays.  Please advise.  Pt's # [phone]

## 2022-05-25 ENCOUNTER — Encounter: Payer: Self-pay | Admitting: Cardiovascular Disease

## 2022-05-25 NOTE — Progress Notes (Signed)
This encounter was created in error - please disregard.

## 2022-05-26 ENCOUNTER — Ambulatory Visit: Payer: No Typology Code available for payment source | Admitting: Cardiovascular Disease

## 2022-05-26 NOTE — Progress Notes (Unsigned)
[facility]  PATIENT: Vanessa Mcconnell DOB: 07-May-1961  REFERRING DOCTOR OR PCP:  Dr. Woody Seller; Barton Fanny, NP SOURCE: Patient, notes, imaging and lab reports, MRI images personally reviewed.  _________________________________   HISTORICAL  CHIEF COMPLAINT:  Chief Complaint  Patient presents with   New Patient (Initial Visit)    Pt in room #11 and alone.  Pt here today for consults for seizures.    HISTORY OF PRESENT ILLNESS:  Vanessa Mcconnell is a 61 y.o. woman with white matter changes on MRI  seizures  She is a 61 yo woman who has had multiple spells of altered awareness.  Both of her parents died 08-16-21.  While at a service she fell and broke the right hand and wrist and separated the shoulder and had rib fractures.  She had a brief LOC lasting a couple minutes.  She just recalls coming to and felt groggy that day.    She was casted and had another fell 2 months later.   She has had 2 more falls.     Last week after the fall she had severe neck pain and reduced ROM.   Her left leg is giving out.     She called [facility] and was referred back here.     Seizures were considered and she had an EEG reportedly showing findings consistent with partial complex seizures.   We do not have the results (performed in 2022 at [facility]).   It was done due to spells of altered awareness starting with lightheadedness/dizziness.  She could feel these come on and the spells would last a minute or so..   If talking to someone she would not respond and she could not hear them.   She would come back to herself quickly.   Spells at night would last longer and she felt confusion.    She was placed on Keppra ER 2250 mg po qHS.       I previously saw her for episodes of reduced focus and blurry vision.   She had an abnormal MRI shows nonspecific foci predominantly in subcortical white matter -these are much more consistent with chronic microvascular ischemic  change than demyelination.   No definite change compared to 2019.   MRI cervical spine 09/12/2020 showed a normal spinal cord.  She had a left paramedian protrusion at C5C6 causing mild spinal stenosis.   She also saw Dr. Tomi Likens who checked an MRI of the thoracic spine (spinal cord normla has some protrusions to the right at T6T7 and T7T8, left at T9T10 and T10T11 but no nerve root compression or significant spinal stenosis).   She has had a lumbar puncture 11/22/2020.   IgG index was normal.   She had no oligoclonal bands (3 paired bands in CSF/serum usually just incidental finding).   She was referred to me and I felt MS was very unlikely with MRI appearance and CSF results.  She most likely had  mild chronic microvascular ischemic change or sequela of migraine headaches  Due to numbness and dysesthesia I checked neuropathy labs (normal)   She is on duloxetine and gabapentin for fibromyalgia pain.  She also takes tizanidine for muscle spasms but she takes as needed as it makes her sleepy.   She has mild depression and anxiety.   Vascular risks:   She has HTN but no smoking history or DM.   She has tachycardia on metoprolol  Imaging: MRI of the brain 02/23/2022 shows a small  number of small T2/FLAIR hyperintense foci predominantly in the subcortical and deep white matter of the frontal lobes.  None of these appear to be acute.  They do not enhance.  This is unchanged compared to the MRI from 09/12/2020  MRI of the thoracic spine 11/13/2020 shows a normal spinal cord.  There areprotrusions to the right at T6T7 and T7T8, left at T9T10 and T10T11 but no nerve root compression or significant spinal stenosis .  MR angiogram of the head 11/04/2020 was normal  MRI of the brain 09/12/2020 shows scattered T2/FLAIR hyperintense foci in the subcortical and deep white matter.  The pattern is nonspecific and is not typical for demyelination  MRI of the cervical spine 09/12/2020 shows left paramedian disc disc protrusion at  C5-C6 causing mild spinal stenosis but no nerve root compression  Labs: CSF 11/22/2020 showed 3 identical gamma restriction bands in the CSF and serum.  IgG index was normal.  Paired bands in the CSF and serum is usually just an incidental finding.  Labs 01/06/2021 showed normal B12, SSA/SSB and unremarkable SPEP/IEF   REVIEW OF SYSTEMS: Constitutional: No fevers, chills, sweats, or change in appetite Eyes: No visual changes, double vision, eye pain Ear, nose and throat: No hearing loss, ear pain, nasal congestion, sore throat Cardiovascular: No chest pain, palpitations Respiratory:  No shortness of breath at rest or with exertion.   No wheezes GastrointestinaI: No nausea, vomiting, diarrhea, abdominal pain, fecal incontinence Genitourinary:  No dysuria, urinary retention or frequency.  No nocturia. Musculoskeletal:  No neck pain, back pain Integumentary: No rash, pruritus, skin lesions Neurological: as above Psychiatric: No depression at this time.  No anxiety Endocrine: No palpitations, diaphoresis, change in appetite, change in weigh or increased thirst Hematologic/Lymphatic:  No anemia, purpura, petechiae. Allergic/Immunologic: No itchy/runny eyes, nasal congestion, recent allergic reactions, rashes  ALLERGIES: Allergies  Allergen Reactions   Anesthesia S-I-40 [Propofol]    Benadryl [Diphenhydramine Hcl] Hives and Other (See Comments)    Passed out   Erythromycin Hives   Iohexol      Code: HIVES, Desc: Anaphylaxis/Hives, Onset Date: [id number]    Latex Rash   Penicillins Swelling and Rash    Has patient had a PCN reaction causing immediate rash, facial/tongue/throat swelling, SOB or lightheadedness with hypotension: Yes Has patient had a PCN reaction causing severe rash involving mucus membranes or skin necrosis: No Has patient had a PCN reaction that required hospitalization: No Has patient had a PCN reaction occurring within the last 10 years: No If all of the above answers  are "NO", then may proceed with Cephalosporin use.   Tetracyclines & Related Rash    HOME MEDICATIONS:  Current Outpatient Medications:    albuterol (PROVENTIL HFA;VENTOLIN HFA) 108 (90 Base) MCG/ACT inhaler, Inhale into the lungs every 6 (six) hours as needed for wheezing or shortness of breath., Disp: , Rfl:    alendronate (FOSAMAX) 70 MG tablet, Take 70 mg by mouth once a week., Disp: , Rfl:    amLODipine (NORVASC) 5 MG tablet, Take 1 tablet (5 mg total) by mouth daily., Disp: 90 tablet, Rfl: 3   ANORO ELLIPTA 62.5-25 MCG/INH AEPB, Inhale 1 puff into the lungs daily., Disp: , Rfl:    Cholecalciferol (VITAMIN D3) 125 MCG (5000 UT) TABS, Take by mouth. Per patient taking with Calcium, Disp: , Rfl:    DULoxetine (CYMBALTA) 60 MG capsule, Take 60 mg by mouth daily., Disp: , Rfl:    fluticasone (FLONASE) 50 MCG/ACT nasal spray, as needed., Disp: ,  Rfl:    gabapentin (NEURONTIN) 300 MG capsule, One po qAM, one po qPM, two po qhs, Disp: 120 capsule, Rfl: 11   hydrochlorothiazide (MICROZIDE) 12.5 MG capsule, Take 12.5 mg by mouth daily., Disp: , Rfl:    HYDROcodone-acetaminophen (NORCO/VICODIN) 5-325 MG tablet, Take 1 tablet by mouth every 6 (six) hours as needed for severe pain., Disp: 15 tablet, Rfl: 0   levETIRAcetam (KEPPRA) 500 MG tablet, Take 500 mg by mouth in the morning and at bedtime., Disp: , Rfl:    levETIRAcetam (KEPPRA) 750 MG tablet, Take 750 mg by mouth daily. 2 TABLETS AT BEDTIME, Disp: , Rfl:    meclizine (ANTIVERT) 25 MG tablet, Take 25 mg by mouth 3 (three) times daily as needed., Disp: , Rfl:    Methyl Salicylate-Lido-Menthol 4-4-5 % PTCH, Apply 1 patch topically in the morning and at bedtime., Disp: 30 patch, Rfl: 0   metoprolol succinate (TOPROL-XL) 25 MG 24 hr tablet, Take 25 mg by mouth 2 (two) times daily., Disp: , Rfl: 5   ondansetron (ZOFRAN) 4 MG tablet, Take 4 mg by mouth as needed., Disp: , Rfl:    pantoprazole (PROTONIX) 40 MG tablet, Take 1 tablet (40 mg total) by  mouth daily. 30 minutes before breakfast, Disp: 90 tablet, Rfl: 3   potassium chloride (MICRO-K) 10 MEQ CR capsule, Take 10 mEq by mouth daily., Disp: , Rfl:    prazosin (MINIPRESS) 2 MG capsule, Take 2 mg by mouth at bedtime and may repeat dose one time if needed., Disp: , Rfl:    vitamin B-12 (CYANOCOBALAMIN) 100 MCG tablet, Take 100 mcg by mouth daily. Per patient taking a gummie, Disp: , Rfl:    vitamin C (ASCORBIC ACID) 500 MG tablet, Take 500 mg by mouth daily., Disp: , Rfl:    zinc gluconate 50 MG tablet, Take 50 mg by mouth daily., Disp: , Rfl:    ciprofloxacin (CIPRO) 500 MG tablet, Take 500 mg by mouth 2 (two) times daily. Taking for bronchitis (Patient not taking: Reported on 05/27/2022), Disp: , Rfl:    predniSONE (STERAPRED UNI-PAK 21 TAB) 5 MG (21) TBPK tablet, Take by mouth as directed. (Patient not taking: Reported on 05/27/2022), Disp: , Rfl:   PAST MEDICAL HISTORY: Past Medical History:  Diagnosis Date   Arthritis    Asthma    Essential hypertension    Fibromyalgia    History of migraine    Hyperlipidemia    Malignant hyperthermia    Reportedly at age 77 with tonsillectomy   Pancreatitis    Reportedly single episode in childhood   Small intestinal bacterial overgrowth 12/2015   Positive breath test   Thoracic disc herniation     PAST SURGICAL HISTORY: Past Surgical History:  Procedure Laterality Date   ABDOMINAL HYSTERECTOMY     BREAST BIOPSY Left 1990's   3 areas biopsied at different times, all with needle not excisional   COLONOSCOPY N/A 02/11/2015   CHY:IFOYDXAJ diverticulosis in the sigmoid colon/moderate internal hemorrhoids   DEBRIDEMENT LEG     cellulitis debridement as a teen   ESOPHAGEAL DILATION  02/11/2015   Procedure: ESOPHAGEAL DILATION;  Surgeon: Danie Binder, MD;  Location: [facility];  Service: Endoscopy;;   ESOPHAGOGASTRODUODENOSCOPY N/A 02/11/2015   OIN:OMVEHMCN size HH/mild non-erosive gastritis   strep infection     causing skin  infection, debridement buttocks, hip, legs   TONSILLECTOMY     ULNER NERVE SURGERY     Right Elbow    FAMILY HISTORY: Family  History  Problem Relation Age of Onset   Hypertension Mother    Diabetes Father    Heart attack Father        CABG X'3 & stent placement   Hypertension Father    Epilepsy Sister    Supraventricular tachycardia Sister    Other Sister        "hole in heart"   Heart attack Maternal Grandfather    Peripheral Artery Disease Sister    CAD Sister    Congestive Heart Failure Sister        mild diastolic heart failure   Heart disease Son    Heart disease Son    Colon cancer Neg Hx    Breast cancer Neg Hx     SOCIAL HISTORY: Social History   Socioeconomic History   Marital status: Married    Spouse name: Not on file   Number of children: Not on file   Years of education: Not on file   Highest education level: Not on file  Occupational History   Not on file  Tobacco Use   Smoking status: Never   Smokeless tobacco: Never   Tobacco comments:    Never smoked  Vaping Use   Vaping Use: Never used  Substance and Sexual Activity   Alcohol use: No    Alcohol/week: 0.0 standard drinks of alcohol   Drug use: No   Sexual activity: Not on file  Other Topics Concern   Not on file  Social History Narrative   Right handed   Caffeine use: daily   Social Determinants of Health   Financial Resource Strain: Not on file  Food Insecurity: Not on file  Transportation Needs: Not on file  Physical Activity: Not on file  Stress: Not on file  Social Connections: Not on file  Intimate Partner Violence: Not on file       PHYSICAL EXAM  Vitals:   05/27/22 0956  BP: (!) 141/92  Pulse: 75  Weight: 176 lb (79.8 kg)  Height: '5\' 2"'$  (1.575 m)    Body mass index is 32.19 kg/m.   General: The patient is well-developed and well-nourished and in no acute distress  HEENT:  Head is [place]/AT.  Sclera are anicteric.  Funduscopic exam shows normal optic discs and  retinal vessels.  Neck: No carotid bruits are noted.  The neck is nontender.  Cardiovascular: The heart has a regular rate and rhythm with a normal S1 and S2. There were no murmurs, gallops or rubs.    Skin: Extremities are without rash or  edema.  Musculoskeletal:  Back is nontender  Neurologic Exam  Mental status: The patient is alert and oriented x 3 at the time of the examination. The patient has apparent normal recent and remote memory, with an apparently normal attention span and concentration ability.   Speech is normal.  Cranial nerves: Extraocular movements are full. Pupils are equal, round, and reactive to light and accomodation.  Visual fields are full.  Facial symmetry is present. There is good facial sensation to soft touch bilaterally.Facial strength is normal.  Trapezius and sternocleidomastoid strength is normal. No dysarthria is noted.  The tongue is midline, and the patient has symmetric elevation of the soft palate. No obvious hearing deficits are noted.  Motor:  Muscle bulk is normal.   Tone is normal. Strength is  5 / 5 in all 4 extremities.   Sensory: Sensory testing is intact to pinprick, soft touch and vibration sensation in arms but reduced vibration  in left leg relative the right.    Coordination: Cerebellar testing reveals good finger-nose-finger and heel-to-shin bilaterally.  Gait and station: Station is normal.   Gait is normal. Tandem gait is wide. Romberg is negative.   Reflexes: Deep tendon reflexes are symmetric and normal in arms but increased at knees (3+ with spread and ankles, 3 no clonus.  .   Plantar responses are flexor.    DIAGNOSTIC DATA (LABS, IMAGING, TESTING) - I reviewed patient records, labs, notes, testing and imaging myself where available.  Lab Results  Component Value Date   WBC 11.7 (H) 08/26/2017   HGB 15.1 (H) 08/26/2017   HCT 45.6 08/26/2017   MCV 90.8 08/26/2017   PLT 343 08/26/2017      Component Value Date/Time   NA  139 12/09/2021 1104   K 3.8 12/09/2021 1104   CL 100 12/09/2021 1104   CO2 24 12/09/2021 1104   GLUCOSE 76 12/09/2021 1104   GLUCOSE 120 (H) 08/27/2017 0840   BUN 13 12/09/2021 1104   CREATININE 0.95 12/09/2021 1104   CREATININE 0.64 02/05/2015 0941   CALCIUM 9.6 12/09/2021 1104   PROT 7.0 01/06/2021 1126   ALBUMIN 4.5 11/22/2020 1137   AST 16 08/26/2017 1453   ALT 17 08/26/2017 1453   ALKPHOS 125 08/26/2017 1453   BILITOT 0.7 08/26/2017 1453   GFRNONAA >60 08/27/2017 0840   GFRAA >60 08/27/2017 0840   No results found for: "CHOL", "HDL", "LDLCALC", "LDLDIRECT", "TRIG", "CHOLHDL" No results found for: "HGBA1C" Lab Results  Component Value Date   VITAMINB12 643 01/06/2021   Lab Results  Component Value Date   TSH 1.54 12/26/2015       ASSESSMENT AND PLAN  Seizures ([place]) - Plan: EEG adult  White matter abnormality on MRI of brain - Plan: MR CERVICAL SPINE WO CONTRAST  Numbness - Plan: MR CERVICAL SPINE WO CONTRAST  Unsteady gait - Plan: MR CERVICAL SPINE WO CONTRAST  Transient alteration of awareness - Plan: EEG adult  Fibromyalgia  Depression with anxiety    We will try to get 2022 EEG results and check another EEG while on levetiracetam.   For now continue levetiracetam.   MRI cervical spine to r/o myelopathy due to falls and gait disturbance and mild hyper-reflexia at knees The mild changes on the MRI are most consistent with chronic microvascular ischemic change.  There is no evidence of MS.   Stay active and exercise as tolerated Rtc 6 months  45-minute office visit with the majority of the time spent face-to-face for history and physical, discussion/counseling and decision-making.  Additional time with record review and documentation.    Jacci Ruberg A. Felecia Shelling, MD, [facility] 31/51/7616, 07:37 AM Certified in Neurology, Clinical Neurophysiology, Sleep Medicine and Neuroimaging  [facility] [address], [place], [place]  [address] [phone]

## 2022-05-27 ENCOUNTER — Encounter: Payer: Self-pay | Admitting: Neurology

## 2022-05-27 ENCOUNTER — Ambulatory Visit (INDEPENDENT_AMBULATORY_CARE_PROVIDER_SITE_OTHER): Payer: No Typology Code available for payment source | Admitting: Neurology

## 2022-05-27 VITALS — BP 141/92 | HR 75 | Ht 62.0 in | Wt 176.0 lb

## 2022-05-27 DIAGNOSIS — R200 Anesthesia of skin: Secondary | ICD-10-CM | POA: Diagnosis not present

## 2022-05-27 DIAGNOSIS — R9082 White matter disease, unspecified: Secondary | ICD-10-CM | POA: Diagnosis not present

## 2022-05-27 DIAGNOSIS — M797 Fibromyalgia: Secondary | ICD-10-CM

## 2022-05-27 DIAGNOSIS — R2681 Unsteadiness on feet: Secondary | ICD-10-CM | POA: Diagnosis not present

## 2022-05-27 DIAGNOSIS — R569 Unspecified convulsions: Secondary | ICD-10-CM | POA: Diagnosis not present

## 2022-05-27 DIAGNOSIS — R404 Transient alteration of awareness: Secondary | ICD-10-CM | POA: Diagnosis not present

## 2022-05-27 DIAGNOSIS — F418 Other specified anxiety disorders: Secondary | ICD-10-CM

## 2022-05-29 DIAGNOSIS — Z299 Encounter for prophylactic measures, unspecified: Secondary | ICD-10-CM | POA: Diagnosis not present

## 2022-05-29 DIAGNOSIS — R0789 Other chest pain: Secondary | ICD-10-CM | POA: Diagnosis not present

## 2022-05-29 DIAGNOSIS — I10 Essential (primary) hypertension: Secondary | ICD-10-CM | POA: Diagnosis not present

## 2022-06-08 ENCOUNTER — Ambulatory Visit: Payer: No Typology Code available for payment source | Admitting: Orthopedic Surgery

## 2022-06-09 ENCOUNTER — Telehealth: Payer: Self-pay | Admitting: Neurology

## 2022-06-09 NOTE — Telephone Encounter (Signed)
[place]: #[id number] exp. 06/09/22-07/12/22 sent to [facility]

## 2022-06-11 ENCOUNTER — Encounter: Payer: No Typology Code available for payment source | Admitting: Orthopedic Surgery

## 2022-06-12 DIAGNOSIS — Z008 Encounter for other general examination: Secondary | ICD-10-CM | POA: Diagnosis not present

## 2022-06-12 DIAGNOSIS — E785 Hyperlipidemia, unspecified: Secondary | ICD-10-CM | POA: Diagnosis not present

## 2022-06-12 DIAGNOSIS — J449 Chronic obstructive pulmonary disease, unspecified: Secondary | ICD-10-CM | POA: Diagnosis not present

## 2022-06-12 DIAGNOSIS — I700 Atherosclerosis of aorta: Secondary | ICD-10-CM | POA: Diagnosis not present

## 2022-06-12 DIAGNOSIS — I129 Hypertensive chronic kidney disease with stage 1 through stage 4 chronic kidney disease, or unspecified chronic kidney disease: Secondary | ICD-10-CM | POA: Diagnosis not present

## 2022-06-12 DIAGNOSIS — E669 Obesity, unspecified: Secondary | ICD-10-CM | POA: Diagnosis not present

## 2022-06-12 DIAGNOSIS — Z6831 Body mass index (BMI) 31.0-31.9, adult: Secondary | ICD-10-CM | POA: Diagnosis not present

## 2022-06-12 DIAGNOSIS — M810 Age-related osteoporosis without current pathological fracture: Secondary | ICD-10-CM | POA: Diagnosis not present

## 2022-06-12 DIAGNOSIS — N182 Chronic kidney disease, stage 2 (mild): Secondary | ICD-10-CM | POA: Diagnosis not present

## 2022-06-12 DIAGNOSIS — R2681 Unsteadiness on feet: Secondary | ICD-10-CM | POA: Diagnosis not present

## 2022-06-12 DIAGNOSIS — F324 Major depressive disorder, single episode, in partial remission: Secondary | ICD-10-CM | POA: Diagnosis not present

## 2022-06-12 DIAGNOSIS — G4089 Other seizures: Secondary | ICD-10-CM | POA: Diagnosis not present

## 2022-06-12 DIAGNOSIS — K219 Gastro-esophageal reflux disease without esophagitis: Secondary | ICD-10-CM | POA: Diagnosis not present

## 2022-06-12 DIAGNOSIS — F4310 Post-traumatic stress disorder, unspecified: Secondary | ICD-10-CM | POA: Diagnosis not present

## 2022-06-23 DIAGNOSIS — H6992 Unspecified Eustachian tube disorder, left ear: Secondary | ICD-10-CM | POA: Diagnosis not present

## 2022-06-23 DIAGNOSIS — Z299 Encounter for prophylactic measures, unspecified: Secondary | ICD-10-CM | POA: Diagnosis not present

## 2022-06-23 DIAGNOSIS — I10 Essential (primary) hypertension: Secondary | ICD-10-CM | POA: Diagnosis not present

## 2022-06-29 ENCOUNTER — Ambulatory Visit (HOSPITAL_COMMUNITY)
Admission: RE | Admit: 2022-06-29 | Discharge: 2022-06-29 | Disposition: A | Discharge status: Home / Self Care | Payer: No Typology Code available for payment source | Source: Ambulatory Visit | Attending: Neurology | Admitting: Neurology

## 2022-06-29 DIAGNOSIS — R200 Anesthesia of skin: Secondary | ICD-10-CM

## 2022-06-29 DIAGNOSIS — R2681 Unsteadiness on feet: Secondary | ICD-10-CM

## 2022-06-29 DIAGNOSIS — R9082 White matter disease, unspecified: Secondary | ICD-10-CM | POA: Diagnosis not present

## 2022-06-29 DIAGNOSIS — M542 Cervicalgia: Secondary | ICD-10-CM | POA: Diagnosis not present

## 2022-07-02 ENCOUNTER — Ambulatory Visit (INDEPENDENT_AMBULATORY_CARE_PROVIDER_SITE_OTHER): Payer: No Typology Code available for payment source | Admitting: Neurology

## 2022-07-02 DIAGNOSIS — R4182 Altered mental status, unspecified: Secondary | ICD-10-CM

## 2022-07-02 DIAGNOSIS — R569 Unspecified convulsions: Secondary | ICD-10-CM

## 2022-07-02 DIAGNOSIS — R404 Transient alteration of awareness: Secondary | ICD-10-CM

## 2022-07-03 NOTE — Progress Notes (Signed)
   [facility]  EEG (ELECTROENCEPHALOGRAM) REPORT   STUDY DATE: 07/02/2022 PATIENT NAME: Vanessa Mcconnell DOB: [birth year] MRN: [id number]  ORDERING CLINICIAN: Celestial Barnfield A. Felecia Shelling, MD. PhD  TECHNOLOGIST: Wyman Songster, REEGT TECHNIQUE: Electroencephalogram was recorded utilizing standard 10-20 system of lead placement and reformatted into average and bipolar montages.  RECORDING TIME: 24 minutes 11 seconds  CLINICAL INFORMATION: 61 year old woman with transient alteration of awareness  FINDINGS: A digital EEG was performed while the patient was awake and drowsy. While awake and most alert there was 14 to 15 Hz posterior dominant rhythm. Voltages and frequencies were symmetric.  There were no focal, lateralizing, epileptiform activity or seizures seen.  Photic stimulation had a normal driving response. Hyperventilation was not performed due to COPD.Marland Kitchen EKG channel shows normal sinus rhythm..  The patient was briefly drowsy but did not answer definitive sleep.  IMPRESSION: This EEG for the patient was awake and drowsy shows the following: No epileptiform activity. Increased beta activity could be a medication effect.   INTERPRETING PHYSICIAN:   Niurka Benecke A. Felecia Shelling, MD, PhD, [facility] Certified in Neurology, Clinical Neurophysiology, Sleep Medicine, Pain Medicine and Neuroimaging  [facility] [address], [place], [place] [address] [phone]

## 2022-07-07 ENCOUNTER — Encounter: Payer: Self-pay | Admitting: *Deleted

## 2022-07-08 NOTE — Telephone Encounter (Signed)
Called pt. Relayed results per Dr. Garth Bigness note. Pt verbalized understanding. She will keep scheduled appt for 11/25/22 at 11am. She will call if she has any further episodes prior to next appt

## 2022-07-26 ENCOUNTER — Encounter: Payer: Self-pay | Admitting: Cardiovascular Disease

## 2022-07-26 NOTE — Progress Notes (Signed)
This encounter was created in error - please disregard.

## 2022-07-27 ENCOUNTER — Encounter: Payer: No Typology Code available for payment source | Admitting: Cardiovascular Disease

## 2022-07-29 ENCOUNTER — Ambulatory Visit (HOSPITAL_COMMUNITY)
Admission: RE | Admit: 2022-07-29 | Discharge: 2022-07-29 | Disposition: A | Discharge status: Home / Self Care | Payer: No Typology Code available for payment source | Source: Ambulatory Visit | Attending: Internal Medicine | Admitting: Internal Medicine

## 2022-07-29 ENCOUNTER — Other Ambulatory Visit (HOSPITAL_COMMUNITY): Payer: Self-pay | Admitting: Internal Medicine

## 2022-07-29 DIAGNOSIS — D45 Polycythemia vera: Secondary | ICD-10-CM | POA: Diagnosis not present

## 2022-07-29 DIAGNOSIS — I10 Essential (primary) hypertension: Secondary | ICD-10-CM | POA: Diagnosis not present

## 2022-07-29 DIAGNOSIS — Z299 Encounter for prophylactic measures, unspecified: Secondary | ICD-10-CM | POA: Diagnosis not present

## 2022-07-29 DIAGNOSIS — R350 Frequency of micturition: Secondary | ICD-10-CM | POA: Diagnosis not present

## 2022-07-29 DIAGNOSIS — R109 Unspecified abdominal pain: Secondary | ICD-10-CM

## 2022-07-30 ENCOUNTER — Other Ambulatory Visit: Payer: Self-pay

## 2022-07-30 ENCOUNTER — Encounter: Payer: Self-pay | Admitting: Internal Medicine

## 2022-07-30 ENCOUNTER — Ambulatory Visit (HOSPITAL_COMMUNITY)
Admission: RE | Admit: 2022-07-30 | Discharge: 2022-07-30 | Disposition: A | Discharge status: Home / Self Care | Payer: No Typology Code available for payment source | Source: Ambulatory Visit | Attending: Internal Medicine | Admitting: Internal Medicine

## 2022-07-30 ENCOUNTER — Ambulatory Visit (INDEPENDENT_AMBULATORY_CARE_PROVIDER_SITE_OTHER): Payer: No Typology Code available for payment source | Admitting: Internal Medicine

## 2022-07-30 VITALS — BP 134/88 | HR 62 | Temp 98.4°F | Ht 62.0 in | Wt 178.8 lb

## 2022-07-30 DIAGNOSIS — J4489 Other specified chronic obstructive pulmonary disease: Secondary | ICD-10-CM

## 2022-07-30 DIAGNOSIS — J40 Bronchitis, not specified as acute or chronic: Secondary | ICD-10-CM | POA: Diagnosis not present

## 2022-07-30 DIAGNOSIS — M47814 Spondylosis without myelopathy or radiculopathy, thoracic region: Secondary | ICD-10-CM | POA: Diagnosis not present

## 2022-07-30 MED ORDER — BREZTRI AEROSPHERE 160-9-4.8 MCG/ACT IN AERO: 2.0000 | INHALATION_SPRAY | Freq: Two times a day (BID) | RESPIRATORY_TRACT | 11 refills | Status: DC

## 2022-07-30 NOTE — Assessment & Plan Note (Addendum)
Passive exposure to cigarettes growing up - 07/30/2022  After extensive coaching inhaler device,  effectiveness =  75% (short ti) -Labs ordered 07/30/2022  :      alpha one AT phenotype  Eos 0. 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  Will try sample of breztr 2bi p she was instructed on optimal technique with saba prn more appropriately:  Re SABA :  I spent extra time with pt today reviewing appropriate use of albuterol for prn use on exertion with the following points: 1) saba is for relief of sob that does not improve by walking a slower pace or resting but rather if the pt does not improve after trying this first. 2) If the pt is convinced, as many are, that saba helps recover from activity faster then it's easy to tell if this is the case by re-challenging : ie stop, take the inhaler, then p 5 minutes try the exact same activity (intensity of workload) that just caused the symptoms and see if they are substantially diminished or not after saba 3) if there is an activity that reproducibly causes the symptoms, try the saba 15 min before the activity on alternate days   If in fact the saba really does help, then fine to continue to use it prn but advised may need to look closer at the maintenance regimen being used (anoro vs breztri) to achieve better control of airways disease with exertion.    Each maintenance medication was reviewed in detail including emphasizing most importantly the difference between maintenance and prns and under what circumstances the prns are to be triggered using an action  plan format where appropriate.  Total time for H and P, chart review, counseling, reviewing hfa device(s) , directly observing portions of ambulatory 02 saturation study/ and generating customized AVS unique to this office visit / same day charting  > 45 min new pt eval

## 2022-07-30 NOTE — Patient Instructions (Addendum)
Plan A = Automatic = Always=    Breztri Take 2 puffs first thing in am and then another 2 puffs about 12 hours later.    Work on inhaler technique:  relax and gently blow all the way out then take a nice smooth full deep breath back in, triggering the inhaler at same time you start breathing in.  Hold breath in for at least  5 seconds if you can. Blow out [employer]  thru nose. Rinse and gargle with water when done.  If mouth or throat bother you at all,  try brushing teeth/gums/tongue with arm and hammer toothpaste/ make a slurry and gargle and spit out.     Plan B = Backup (to supplement plan A, not to replace it) Only use your albuterol inhaler as a rescue medication to be used if you can't catch your breath by resting or doing a relaxed purse lip breathing pattern.  - The less you use it, the better it will work when you need it. - Ok to use the inhaler up to 2 puffs  every 4 hours if you must but call for appointment if use goes up over your usual need - Don't leave home without it !!  (think of it like the spare tire for your car)    Please remember to go to the lab department   for your tests - we will call you with the results when they are available.     Please remember to go to the x-ray department at [facility]   for your tests - we will call you with the results when they are available.   Please schedule a follow up visit in 3 months but call sooner if needed  - PFTs at [facility] next available

## 2022-07-30 NOTE — Progress Notes (Signed)
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 5 y f/b cards already Cough: clear mucus stuffy head and ear on cipro 1/17  Sleep: flat with one pillow SABA use: once a day up 3 x  02: none   No obvious patterns in day to day or daytime variability or assoc excess/ purulent sputum or mucus plugs or hemoptysis or cp  subjective wheeze or overt sinus or hb symptoms.   Sleeping  without nocturnal  or early am exacerbation  of respiratory  c/o's or need for noct saba. Also denies any obvious fluctuation of symptoms with weather or environmental changes or other aggravating or alleviating factors except as outlined above   No unusual exposure hx or h/o childhood pna/ asthma or knowledge of premature birth.  Current Allergies, Complete Past Medical History, Past Surgical History, Family History, and Social History were reviewed in [employer] record.  ROS  The following are not active complaints unless bolded Hoarseness, sore throat, dysphagia, dental problems, itching, sneezing,  nasal congestion or discharge of excess mucus or purulent secretions, ear ache,   fever, chills, sweats, unintended wt loss or wt gain, classically pleuritic or exertional cp,  orthopnea pnd or arm/hand swelling  or leg swelling, presyncope, palpitations, abdominal pain, anorexia,  nausea, vomiting, diarrhea  or change in bowel habits or change in bladder habits, change in stools or change in urine, dysuria, hematuria,  rash, arthralgias, visual complaints, headache, numbness, weakness or ataxia or problems with walking or coordination,  change in mood or  memory.             Past Medical History:  Diagnosis Date   Arthritis    Asthma    Essential hypertension    Fibromyalgia    History of migraine    Hyperlipidemia    Malignant hyperthermia    Reportedly at age 62 with tonsillectomy   Pancreatitis    Reportedly single episode in childhood   Small intestinal bacterial overgrowth 12/2015   Positive breath test   Thoracic disc herniation     Outpatient Medications Prior to Visit  Medication Sig Dispense Refill   albuterol (PROVENTIL HFA;VENTOLIN HFA) 108 (90 Base) MCG/ACT inhaler Inhale into the lungs every 6 (six) hours as needed for wheezing or shortness of breath.     alendronate (FOSAMAX) 70 MG tablet Take 70 mg by mouth once a week.     amLODipine (NORVASC) 5 MG tablet Take 1 tablet (5 mg total) by mouth daily. 90 tablet 3   ANORO ELLIPTA 62.5-25 MCG/INH AEPB Inhale 1 puff into the lungs daily.     Cholecalciferol (VITAMIN D3) 125 MCG (5000 UT) TABS Take by mouth. Per patient taking with Calcium     ciprofloxacin (CIPRO) 500 MG tablet  Take 500 mg by mouth 2 (two) times daily. Taking for bronchitis     DULoxetine (CYMBALTA) 60 MG capsule Take 60 mg by mouth daily.     fluticasone (FLONASE) 50 MCG/ACT nasal spray as needed.     gabapentin (NEURONTIN) 300 MG capsule One po qAM, one po qPM, two po qhs 120 capsule 11   hydrochlorothiazide (MICROZIDE) 12.5 MG capsule Take 12.5 mg by mouth daily.     HYDROcodone-acetaminophen (NORCO/VICODIN) 5-325 MG tablet Take 1 tablet by mouth every 6 (six) hours as needed for severe pain. 15 tablet 0   levETIRAcetam (KEPPRA) 500 MG tablet Take 500 mg by mouth in the morning and at bedtime.     levETIRAcetam (KEPPRA) 750 MG  tablet Take 750 mg by mouth daily. 2 TABLETS AT BEDTIME     meclizine (ANTIVERT) 25 MG tablet Take 25 mg by mouth 3 (three) times daily as needed.     Methyl Salicylate-Lido-Menthol 4-4-5 % PTCH Apply 1 patch topically in the morning and at bedtime. 30 patch 0   metoprolol succinate (TOPROL-XL) 25 MG 24 hr tablet Take 25 mg by mouth 2 (two) times daily.  5   ondansetron (ZOFRAN) 4 MG tablet Take 4 mg by mouth as needed.     pantoprazole (PROTONIX) 40 MG tablet Take 1 tablet (40 mg total) by mouth daily. 30 minutes before breakfast 90 tablet 3   potassium chloride (MICRO-K) 10 MEQ CR capsule Take 10 mEq by mouth daily.     prazosin (MINIPRESS) 2 MG capsule Take 2 mg by mouth at bedtime and may repeat dose one time if needed.     vitamin B-12 (CYANOCOBALAMIN) 100 MCG tablet Take 100 mcg by mouth daily. Per patient taking a gummie     vitamin C (ASCORBIC ACID) 500 MG tablet Take 500 mg by mouth daily.     zinc gluconate 50 MG tablet Take 50 mg by mouth daily.     predniSONE (STERAPRED UNI-PAK 21 TAB) 5 MG (21) TBPK tablet Take by mouth as directed.     No facility-administered medications prior to visit.     Objective:     BP 134/88   Pulse 62   Temp 98.4 F (36.9 C)   Ht '5\' 2"'$  (1.575 m)   Wt 178 lb 12.8 oz (81.1 kg)   SpO2 97% Comment: ra  BMI 32.70 kg/m   SpO2: 97 % (ra)  Amb somewhat somber wf nad   HEENT : Oropharynx  clear     Nasal turbinates nl    NECK :  without  apparent JVD/ palpable Nodes/TM    LUNGS: no acc muscle use,  Nl contour chest which is clear to A and P bilaterally without cough on insp or exp maneuvers   CV:  RRR  no s3 or murmur or increase in P2, and no edema   ABD:  soft and nontender with nl inspiratory excursion in the supine position. No bruits or organomegaly appreciated   MS:  Nl gait/ ext warm without deformities Or obvious joint restrictions  calf tenderness, cyanosis or clubbing    SKIN: warm and dry without lesions    NEURO:  alert,  approp, nl sensorium with  no motor or cerebellar deficits apparent.    CXR PA and Lateral:   07/30/2022 :    I personally reviewed images and impression is as follows:     Mild kyphosis/ min copd changes       Assessment   Asthmatic bronchitis ,  chronic Passive exposure to cigarettes growing up - 07/30/2022  After extensive coaching inhaler device,  effectiveness =  75% (short ti) -Labs ordered 07/30/2022  :      alpha one AT phenotype  Eos 0. 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  Will try sample of breztr 2bi p she was instructed on optimal technique with saba prn more appropriately:  Re SABA :  I spent extra time with pt today reviewing appropriate use of albuterol for prn use on exertion with the following points: 1) saba is for relief of sob that does not improve by walking a slower pace or resting but rather if the pt does not improve after trying this first. 2) If the pt is convinced, as many are, that saba helps recover from activity faster then it's easy to tell if this is the case by re-challenging : ie stop, take the inhaler, then p 5 minutes try the exact same activity (intensity of workload) that just caused the symptoms and see if they are substantially diminished or not after saba 3) if there is an activity that reproducibly causes the symptoms, try the saba 15 min before the activity on alternate days   If in fact the saba really does help, then fine to continue to use it prn but advised may need to look closer at the maintenance regimen being used (anoro vs breztri) to  achieve better control of airways disease with exertion.    Each maintenance medication was reviewed in detail including emphasizing most importantly the difference between maintenance and prns and under what circumstances the prns are to be triggered using an action plan format where appropriate.  Total time for H and P, chart review, counseling, reviewing hfa device(s) , directly observing portions of ambulatory 02 saturation study/ and generating customized AVS unique to this office visit / same day charting  > 45 min new pt eval                   Christinia Gully, MD 07/30/2022

## 2022-07-31 ENCOUNTER — Ambulatory Visit (HOSPITAL_BASED_OUTPATIENT_CLINIC_OR_DEPARTMENT_OTHER): Payer: No Typology Code available for payment source | Admitting: Internal Medicine

## 2022-07-31 DIAGNOSIS — J4489 Other specified chronic obstructive pulmonary disease: Secondary | ICD-10-CM

## 2022-07-31 LAB — PULMONARY FUNCTION TEST
DL/VA % pred: 101 %
DL/VA: 4.35 ml/min/mmHg/L
DLCO cor % pred: 101 %
DLCO cor: 19.05 ml/min/mmHg
DLCO unc % pred: 101 %
DLCO unc: 19.05 ml/min/mmHg
FEF 25-75 Pre: 2.27 L/sec
FEF2575-%Pred-Pre: 103 %
FEV1-%Pred-Pre: 105 %
FEV1-Pre: 2.48 L
FEV1FVC-%Pred-Pre: 99 %
FEV6-%Pred-Pre: 109 %
FEV6-Pre: 3.2 L
FEV6FVC-%Pred-Pre: 103 %
FVC-%Pred-Pre: 105 %
FVC-Pre: 3.2 L
Pre FEV1/FVC ratio: 78 %
Pre FEV6/FVC Ratio: 100 %
RV % pred: 125 %
RV: 2.39 L
TLC % pred: 110 %
TLC: 5.26 L

## 2022-07-31 NOTE — Progress Notes (Signed)
Full PFT Performed today, minus Post-Spirometry.

## 2022-07-31 NOTE — Patient Instructions (Signed)
Full PFT Performed today, minus Post-Spirometry.

## 2022-08-04 ENCOUNTER — Encounter: Payer: Self-pay | Admitting: Cardiovascular Disease

## 2022-08-04 LAB — CBC WITH DIFFERENTIAL/PLATELET
Basophils Absolute: 0.1 10*3/uL (ref 0.0–0.2)
Basos: 1 %
EOS (ABSOLUTE): 0.2 10*3/uL (ref 0.0–0.4)
Eos: 2 %
Hematocrit: 45.5 % (ref 34.0–46.6)
Hemoglobin: 15.6 g/dL (ref 11.1–15.9)
Immature Grans (Abs): 0 10*3/uL (ref 0.0–0.1)
Immature Granulocytes: 0 %
Lymphocytes Absolute: 1.8 10*3/uL (ref 0.7–3.1)
Lymphs: 23 %
MCH: 30.8 pg (ref 26.6–33.0)
MCHC: 34.3 g/dL (ref 31.5–35.7)
MCV: 90 fL (ref 79–97)
Monocytes Absolute: 0.4 10*3/uL (ref 0.1–0.9)
Monocytes: 6 %
Neutrophils Absolute: 5.4 10*3/uL (ref 1.4–7.0)
Neutrophils: 68 %
Platelets: 356 10*3/uL (ref 150–450)
RBC: 5.07 x10E6/uL (ref 3.77–5.28)
RDW: 12.8 % (ref 11.7–15.4)
WBC: 7.8 10*3/uL (ref 3.4–10.8)

## 2022-08-04 LAB — ALPHA-1-ANTITRYPSIN PHENOTYP: A-1 Antitrypsin: 162 mg/dL (ref 101–187)

## 2022-08-04 LAB — IGE: IgE (Immunoglobulin E), Serum: 5 IU/mL — ABNORMAL LOW (ref 6–495)

## 2022-08-04 NOTE — Progress Notes (Signed)
This encounter was created in error - please disregard.

## 2022-08-05 ENCOUNTER — Telehealth: Payer: Self-pay | Admitting: *Deleted

## 2022-08-05 ENCOUNTER — Ambulatory Visit: Payer: No Typology Code available for payment source | Admitting: Cardiovascular Disease

## 2022-08-05 ENCOUNTER — Encounter: Payer: Self-pay | Admitting: Cardiovascular Disease

## 2022-08-05 NOTE — Progress Notes (Unsigned)
Cardiology Office Note:    Date:  08/06/2022   ID:  Vanessa Mcconnell, DOB [birth year], MRN [id number]  PCP:  Glenda Chroman, MD   [facility] Providers Cardiologist: Selinda Michaels to update primary MD,subspecialty MD or APP then REFRESH:1}    Referring MD: Glenda Chroman, MD   Chief Complaint  Patient presents with   Hypertension    History of Present Illness:    Vanessa Mcconnell is a 62 y.o. female with a hx of HTN, fibromyaliga, HLD She presents for further evaluation of some leg swelling recently . She typically sees Dr. Domenic Polite in the [place] office.   She wanted to be seen sooner than he would be able to see her and was scheduled with me.    Both her parents were in hospice in January.   She and her daughter sat with them for the 2 weeks .   Minimal activity,   lots of sitting,  fast food, take out fod.   Was there for 24 hours for 11 days  Both parents passed away a month ago .    Has been more active and eating a better diet for the past month since the death of her parent s.   Hx of MS Had a seizure a month ago and broke R wrist, hand, A/C joint, 3 R ribs  Taked amlodipine 10 mg a day  Metoprolol XL 25 mg a day  Prazosin 2 mg a day ( for PTSD)    Echocardiogram from 2018 reveals normal left ventricular systolic function.  She has mild mitral valve prolapse with mild mitral regurgitation. Has occasional chest twinges.   Does not exercise ,  plans on exercising now that her parents have passed away   She is unsure of her medications    Jan. 15, 2024 No show, patient cancelled   Jan. 24, 2024 Vanessa Mcconnell is seen for follow up of her HTN Leg edema  Has some DOE with exercise  Has wheezing with this DOE    She just started rosuvastatin recently.  She is only taking 5 mg once weekly Fam hx  Father had MI at 26 Sister had severe PAD   Will get a coronary CTA for her severe DOE  Her severe shortness of breath may be an anginal  equivalent.    Past Medical History:  Diagnosis Date   Arthritis    Asthma    Essential hypertension    Fibromyalgia    History of migraine    Hyperlipidemia    Malignant hyperthermia    Reportedly at age 39 with tonsillectomy   Pancreatitis    Reportedly single episode in childhood   Small intestinal bacterial overgrowth 12/2015   Positive breath test   Thoracic disc herniation     Past Surgical History:  Procedure Laterality Date   ABDOMINAL HYSTERECTOMY     BREAST BIOPSY Left 1990's   3 areas biopsied at different times, all with needle not excisional   COLONOSCOPY N/A 02/11/2015   JEH:UDJSHFWY diverticulosis in the sigmoid colon/moderate internal hemorrhoids   DEBRIDEMENT LEG     cellulitis debridement as a teen   ESOPHAGEAL DILATION  02/11/2015   Procedure: ESOPHAGEAL DILATION;  Surgeon: Danie Binder, MD;  Location: [facility];  Service: Endoscopy;;   ESOPHAGOGASTRODUODENOSCOPY N/A 02/11/2015   OVZ:CHYIFOYD size HH/mild non-erosive gastritis   strep infection     causing skin infection, debridement buttocks, hip, legs   TONSILLECTOMY  ULNER NERVE SURGERY     Right Elbow    Current Medications: Current Meds  Medication Sig   albuterol (PROVENTIL HFA;VENTOLIN HFA) 108 (90 Base) MCG/ACT inhaler Inhale into the lungs every 6 (six) hours as needed for wheezing or shortness of breath.   alendronate (FOSAMAX) 70 MG tablet Take 70 mg by mouth once a week.   amLODipine (NORVASC) 5 MG tablet Take 1 tablet (5 mg total) by mouth daily.   Budeson-Glycopyrrol-Formoterol (BREZTRI AEROSPHERE) 160-9-4.8 MCG/ACT AERO Inhale 2 puffs into the lungs 2 (two) times daily.   Cholecalciferol (VITAMIN D3) 125 MCG (5000 UT) TABS Take by mouth. Per patient taking with Calcium   diphenhydrAMINE (BENADRYL) 50 MG capsule Take one capsule 1 hour prior to scan.   DULoxetine (CYMBALTA) 60 MG capsule Take 60 mg by mouth daily.   fluticasone (FLONASE) 50 MCG/ACT nasal spray as needed.    gabapentin (NEURONTIN) 300 MG capsule One po qAM, one po qPM, two po qhs   hydrochlorothiazide (MICROZIDE) 12.5 MG capsule Take 12.5 mg by mouth daily.   HYDROcodone-acetaminophen (NORCO/VICODIN) 5-325 MG tablet Take 1 tablet by mouth every 6 (six) hours as needed for severe pain.   levETIRAcetam (KEPPRA) 750 MG tablet Take 750 mg by mouth daily. 2 TABLETS AT BEDTIME   meclizine (ANTIVERT) 25 MG tablet Take 25 mg by mouth 3 (three) times daily as needed.   metoprolol succinate (TOPROL-XL) 25 MG 24 hr tablet Take 25 mg by mouth 2 (two) times daily.   metoprolol tartrate (LOPRESSOR) 25 MG tablet Take 1 tablet (25 mg total) by mouth once for 1 dose. Take 90-120 minutes prior to scan.   ondansetron (ZOFRAN) 4 MG tablet Take 4 mg by mouth as needed.   pantoprazole (PROTONIX) 40 MG tablet Take 1 tablet (40 mg total) by mouth daily. 30 minutes before breakfast   potassium chloride (MICRO-K) 10 MEQ CR capsule Take 10 mEq by mouth daily.   prazosin (MINIPRESS) 2 MG capsule Take 2 mg by mouth at bedtime and may repeat dose one time if needed.   predniSONE (DELTASONE) 50 MG tablet Take one tablet 13 hours, 7 hours, and 1 hour prior to scan.   rosuvastatin (CRESTOR) 5 MG tablet Take 5 mg by mouth once a week.   vitamin B-12 (CYANOCOBALAMIN) 100 MCG tablet Take 100 mcg by mouth daily. Per patient taking a gummie   vitamin C (ASCORBIC ACID) 500 MG tablet Take 500 mg by mouth daily.   zinc gluconate 50 MG tablet Take 50 mg by mouth daily.     Allergies:   Anesthesia s-i-40 [propofol], Benadryl [diphenhydramine hcl], Erythromycin, Iohexol, Latex, Penicillins, and Tetracyclines & related   Social History   Socioeconomic History   Marital status: Married    Spouse name: Not on file   Number of children: Not on file   Years of education: Not on file   Highest education level: Not on file  Occupational History   Not on file  Tobacco Use   Smoking status: Never   Smokeless tobacco: Never   Tobacco  comments:    Never smoked  Vaping Use   Vaping Use: Never used  Substance and Sexual Activity   Alcohol use: No    Alcohol/week: 0.0 standard drinks of alcohol   Drug use: No   Sexual activity: Not on file  Other Topics Concern   Not on file  Social History Narrative   Right handed   Caffeine use: daily   Social Determinants of Health  Financial Resource Strain: Not on file  Food Insecurity: Not on file  Transportation Needs: Not on file  Physical Activity: Not on file  Stress: Not on file  Social Connections: Not on file     Family History: The patient's family history includes CAD in her sister; Congestive Heart Failure in her sister; Diabetes in her father; Epilepsy in her sister; Heart attack in her father and maternal grandfather; Heart disease in her son and son; Hypertension in her father and mother; Other in her sister; Peripheral Artery Disease in her sister; Supraventricular tachycardia in her sister. There is no history of Colon cancer or Breast cancer.  ROS:   Please see the history of present illness.     All other systems reviewed and are negative.  EKGs/Labs/Other Studies Reviewed:    The following studies were reviewed today:   EKG: August 06, 2022: Normal sinus rhythm.  Moderate voltage criteria for LVH.  Borderline EKG.  Recent Labs: 07/30/2022: Hemoglobin 15.6; Platelets 356 08/06/2022: BUN 12; Creatinine, Ser 0.92; Potassium 4.9; Sodium 135  Recent Lipid Panel No results found for: "CHOL", "TRIG", "HDL", "CHOLHDL", "VLDL", "LDLCALC", "LDLDIRECT"   Risk Assessment/Calculations:           Physical Exam:     Physical Exam: Blood pressure 110/70, pulse 61, height '5\' 2"'$  (1.575 m), weight 174 lb (78.9 kg), SpO2 98 %.       GEN:  Well nourished, well developed in no acute distress HEENT: Normal NECK: No JVD; No carotid bruits LYMPHATICS: No lymphadenopathy CARDIAC: RRR , no murmurs, rubs, gallops RESPIRATORY:  Clear to auscultation without  rales, wheezing or rhonchi  ABDOMEN: Soft, non-tender, non-distended MUSCULOSKELETAL:  No edema; No deformity  SKIN: Warm and dry NEUROLOGIC:  Alert and oriented x 3      ASSESSMENT:    1. Pre-procedure lab exam   2. Hyperlipidemia, unspecified hyperlipidemia type   3. DOE (dyspnea on exertion)     PLAN:       Severe shortness of breath with exertion:.  Presents for further evaluation of episodes of shortness of breath with minimal exertion.  She notes that she is severely out of breath when she brings groceries in from the car.  She has known asthma and these episodes may be asthma but they could be an anginal equivalent.  She has family history of coronary artery disease and hyperlipidemia. Will schedule her for a coronary CT angiogram.  Will check a lipoprotein a today.  3.  Leg edema:   improved.    Medication Adjustments/Labs and Tests Ordered: Current medicines are reviewed at length with the patient today.  Concerns regarding medicines are outlined above.  Orders Placed This Encounter  Procedures   CT CORONARY MORPH W/CTA COR W/SCORE W/CA W/CM &/OR WO/CM   Basic metabolic panel   Lipoprotein A (LPA)   EKG 12-Lead   Meds ordered this encounter  Medications   metoprolol tartrate (LOPRESSOR) 25 MG tablet    Sig: Take 1 tablet (25 mg total) by mouth once for 1 dose. Take 90-120 minutes prior to scan.    Dispense:  1 tablet    Refill:  0   predniSONE (DELTASONE) 50 MG tablet    Sig: Take one tablet 13 hours, 7 hours, and 1 hour prior to scan.    Dispense:  3 tablet    Refill:  0   diphenhydrAMINE (BENADRYL) 50 MG capsule    Sig: Take one capsule 1 hour prior to scan.  Dispense:  1 capsule    Refill:  0      Patient Instructions  Medication Instructions:  Your physician recommends that you continue on your current medications as directed. Please refer to the Current Medication list given to you today.  *If you need a refill on your cardiac medications before  your next appointment, please call your pharmacy*  Lab Work: TODAY: Lipoprotein A (LPa), BMET If you have labs (blood work) drawn today and your tests are completely normal, you will receive your results only by: [place] (if you have MyChart) OR A paper copy in the mail If you have any lab test that is abnormal or we need to change your treatment, we will call you to review the results.  Testing/Procedures: Your physician has recommended you have a coronary CTA. A scheduler from [facility] will call you to schedule this procedure.  Follow-Up: At [facility], you and your health needs are our priority.  As part of our continuing mission to provide you with exceptional heart care, we have created designated Provider Care Teams.  These Care Teams include your primary Cardiologist (physician) and Advanced Practice Providers (APPs -  Physician Assistants and Nurse Practitioners) who all work together to provide you with the care you need, when you need it.  Your next appointment:   3 month(s)  Provider:   Christen Bame, NP   Other Instructions   Your cardiac CT will be scheduled at:   [facility] [address] [place], [place] [address] [phone]  Please arrive at the [facility] and Children's Entrance (Entrance C2) of [facility] 30 minutes prior to test start time. You can use the FREE valet parking offered at entrance C (encouraged to control the heart rate for the test)  Proceed to the [facility] Radiology Department (first floor) to check-in and test prep.  All radiology patients and guests should use entrance C2 at [facility], accessed from [facility], even though the hospital's physical address listed is [address].    Please follow these instructions carefully (unless otherwise directed):  On the Night Before the Test: Be sure to Drink plenty of water. Do not consume any  caffeinated/decaffeinated beverages or chocolate 12 hours prior to your test. Do not take any antihistamines 12 hours prior to your test. If the patient has contrast allergy: Patient will need a prescription for Prednisone and very clear instructions (as follows): Prednisone 50 mg - take 13 hours prior to test Take another Prednisone 50 mg 7 hours prior to test Take another Prednisone 50 mg 1 hour prior to test Take Benadryl 50 mg 1 hour prior to test Patient must complete all four doses of above prophylactic medications. Patient will need a ride after test due to Benadryl.  On the Day of the Test: Drink plenty of water until 1 hour prior to the test. Do not eat any food 1 hour prior to test. You may take your regular medications prior to the test.  Take metoprolol (Lopressor) '25mg'$  two hours prior to test. This has been sent to your pharmacy. HOLD Hydrochlorothiazide morning of the test. FEMALES- please wear underwire-free bra if available, avoid dresses & tight clothing      After the Test: Drink plenty of water. After receiving IV contrast, you may experience a mild flushed feeling. This is normal. On occasion, you may experience a mild rash up to 24 hours after the test. This is  not dangerous. If this occurs, you can take Benadryl 25 mg and increase your fluid intake. If you experience trouble breathing, this can be serious. If it is severe call 911 IMMEDIATELY. If it is mild, please call our office. If you take any of these medications: Glipizide/Metformin, Avandament, Glucavance, please do not take 48 hours after completing test unless otherwise instructed.  We will call to schedule your test 2-4 weeks out understanding that some insurance companies will need an authorization prior to the service being performed.   For non-scheduling related questions, please contact the cardiac imaging nurse navigator should you have any questions/concerns: Marchia Bond, Cardiac Imaging Nurse  Navigator Gordy Clement, Cardiac Imaging Nurse Navigator [facility] Direct Office Dial: [phone]   For scheduling needs, including cancellations and rescheduling, please call [place], [phone].    Signed, Mertie Moores, MD  08/06/2022 5:48 PM    [place]

## 2022-08-05 NOTE — Telephone Encounter (Signed)
Called and spoke with patient.  Went over CXR and PFT results and she voiced understanding.  Did not see documentation from Dr. Melvyn Novas on lab result note but told patient labs looked to be normal and will verify with Dr. Melvyn Novas.   Dr. Melvyn Novas please advise, thanks!

## 2022-08-06 ENCOUNTER — Ambulatory Visit
Payer: No Typology Code available for payment source | Attending: Cardiovascular Disease | Admitting: Cardiovascular Disease

## 2022-08-06 ENCOUNTER — Encounter: Payer: Self-pay | Admitting: Cardiovascular Disease

## 2022-08-06 VITALS — BP 110/70 | HR 61 | Ht 62.0 in | Wt 174.0 lb

## 2022-08-06 DIAGNOSIS — R0609 Other forms of dyspnea: Secondary | ICD-10-CM | POA: Diagnosis not present

## 2022-08-06 DIAGNOSIS — E785 Hyperlipidemia, unspecified: Secondary | ICD-10-CM

## 2022-08-06 DIAGNOSIS — Z01812 Encounter for preprocedural laboratory examination: Secondary | ICD-10-CM | POA: Diagnosis not present

## 2022-08-06 MED ORDER — PREDNISONE 50 MG PO TABS: ORAL_TABLET | ORAL | 0 refills | Status: DC

## 2022-08-06 MED ORDER — DIPHENHYDRAMINE HCL 50 MG PO CAPS: ORAL_CAPSULE | ORAL | 0 refills | Status: DC

## 2022-08-06 MED ORDER — METOPROLOL TARTRATE 25 MG PO TABS: 25.0000 mg | ORAL_TABLET | Freq: Once | ORAL | 0 refills | Status: DC

## 2022-08-06 NOTE — Telephone Encounter (Signed)
See result note

## 2022-08-06 NOTE — Patient Instructions (Signed)
Medication Instructions:  Your physician recommends that you continue on your current medications as directed. Please refer to the Current Medication list given to you today.  *If you need a refill on your cardiac medications before your next appointment, please call your pharmacy*  Lab Work: TODAY: Lipoprotein A (LPa), BMET If you have labs (blood work) drawn today and your tests are completely normal, you will receive your results only by: [place] (if you have MyChart) OR A paper copy in the mail If you have any lab test that is abnormal or we need to change your treatment, we will call you to review the results.  Testing/Procedures: Your physician has recommended you have a coronary CTA. A scheduler from [facility] will call you to schedule this procedure.  Follow-Up: At [facility], you and your health needs are our priority.  As part of our continuing mission to provide you with exceptional heart care, we have created designated Provider Care Teams.  These Care Teams include your primary Cardiologist (physician) and Advanced Practice Providers (APPs -  Physician Assistants and Nurse Practitioners) who all work together to provide you with the care you need, when you need it.  Your next appointment:   3 month(s)  Provider:   Christen Bame, NP   Other Instructions   Your cardiac CT will be scheduled at:   [facility] [address] [place], [place] [address] [phone]  Please arrive at the [facility] (Entrance C2) of [facility] 30 minutes prior to test start time. You can use the FREE valet parking offered at entrance C (encouraged to control the heart rate for the test)  Proceed to the [facility] Radiology Department (first floor) to check-in and test prep.  All radiology patients and guests should use entrance C2 at [facility], accessed from [facility], even though the  hospital's physical address listed is [address].    Please follow these instructions carefully (unless otherwise directed):  On the Night Before the Test: Be sure to Drink plenty of water. Do not consume any caffeinated/decaffeinated beverages or chocolate 12 hours prior to your test. Do not take any antihistamines 12 hours prior to your test. If the patient has contrast allergy: Patient will need a prescription for Prednisone and very clear instructions (as follows): Prednisone 50 mg - take 13 hours prior to test Take another Prednisone 50 mg 7 hours prior to test Take another Prednisone 50 mg 1 hour prior to test Take Benadryl 50 mg 1 hour prior to test Patient must complete all four doses of above prophylactic medications. Patient will need a ride after test due to Benadryl.  On the Day of the Test: Drink plenty of water until 1 hour prior to the test. Do not eat any food 1 hour prior to test. You may take your regular medications prior to the test.  Take metoprolol (Lopressor) '25mg'$  two hours prior to test. This has been sent to your pharmacy. HOLD Hydrochlorothiazide morning of the test. FEMALES- please wear underwire-free bra if available, avoid dresses & tight clothing      After the Test: Drink plenty of water. After receiving IV contrast, you may experience a mild flushed feeling. This is normal. On occasion, you may experience a mild rash up to 24 hours after the test. This is not dangerous. If this occurs, you can take Benadryl 25 mg and increase your fluid intake. If you experience  trouble breathing, this can be serious. If it is severe call 911 IMMEDIATELY. If it is mild, please call our office. If you take any of these medications: Glipizide/Metformin, Avandament, Glucavance, please do not take 48 hours after completing test unless otherwise instructed.  We will call to schedule your test 2-4 weeks out understanding that some insurance companies will need  an authorization prior to the service being performed.   For non-scheduling related questions, please contact the cardiac imaging nurse navigator should you have any questions/concerns: Marchia Bond, Cardiac Imaging Nurse Navigator Gordy Clement, Cardiac Imaging Nurse Navigator [facility] Direct Office Dial: [phone]   For scheduling needs, including cancellations and rescheduling, please call [place], [phone].

## 2022-08-07 LAB — BASIC METABOLIC PANEL
BUN/Creatinine Ratio: 13 (ref 12–28)
BUN: 12 mg/dL (ref 8–27)
CO2: 21 mmol/L (ref 20–29)
Calcium: 9.8 mg/dL (ref 8.7–10.3)
Chloride: 97 mmol/L (ref 96–106)
Creatinine, Ser: 0.92 mg/dL (ref 0.57–1.00)
Glucose: 87 mg/dL (ref 70–99)
Potassium: 4.9 mmol/L (ref 3.5–5.2)
Sodium: 135 mmol/L (ref 134–144)
eGFR: 71 mL/min/{1.73_m2} (ref >59)

## 2022-08-07 LAB — LIPOPROTEIN A (LPA): Lipoprotein (a): 24.6 nmol/L (ref <75.0)

## 2022-08-10 ENCOUNTER — Telehealth (HOSPITAL_COMMUNITY): Payer: Self-pay | Admitting: *Deleted

## 2022-08-10 NOTE — Telephone Encounter (Signed)
Reaching out to patient to offer assistance regarding upcoming cardiac imaging study; pt verbalizes understanding of appt date/time, parking situation and where to check in, pre-test NPO status and medications ordered, and verified current allergies; name and call back number provided for further questions should they arise  Gordy Clement RN [place] [facility] [phone] office [phone] cell  Patient states she cannot take Benadryl. After consulting with Dr. Vernard Gambles and the pharmacist, the patient will take 13 hour prep at 8pm, 2am, and 8am. She will take '20mg'$  pepcid and '20mg'$  claritin in place of the bendaryl.  She is aware to take to her metoprolol two hours prior to her cardiac CT scan and arrive at 8:30am.

## 2022-08-11 ENCOUNTER — Other Ambulatory Visit: Payer: Self-pay | Admitting: Cardiovascular Disease

## 2022-08-11 ENCOUNTER — Ambulatory Visit (HOSPITAL_COMMUNITY)
Admission: RE | Admit: 2022-08-11 | Discharge: 2022-08-11 | Disposition: A | Discharge status: Home / Self Care | Payer: No Typology Code available for payment source | Source: Ambulatory Visit | Attending: Cardiovascular Disease | Admitting: Cardiovascular Disease

## 2022-08-11 DIAGNOSIS — K449 Diaphragmatic hernia without obstruction or gangrene: Secondary | ICD-10-CM | POA: Diagnosis not present

## 2022-08-11 DIAGNOSIS — I700 Atherosclerosis of aorta: Secondary | ICD-10-CM | POA: Diagnosis not present

## 2022-08-11 DIAGNOSIS — Z01812 Encounter for preprocedural laboratory examination: Secondary | ICD-10-CM

## 2022-08-11 DIAGNOSIS — R0609 Other forms of dyspnea: Secondary | ICD-10-CM | POA: Diagnosis not present

## 2022-08-11 DIAGNOSIS — E785 Hyperlipidemia, unspecified: Secondary | ICD-10-CM

## 2022-08-11 MED ORDER — NITROGLYCERIN 0.4 MG SL SUBL
SUBLINGUAL_TABLET | SUBLINGUAL | Status: AC
  Filled 2022-08-11: qty 2

## 2022-08-11 MED ORDER — METOPROLOL TARTRATE 5 MG/5ML IV SOLN: 5.0000 mg | Freq: Once | INTRAVENOUS | Status: AC

## 2022-08-11 MED ORDER — METOPROLOL TARTRATE 5 MG/5ML IV SOLN
INTRAVENOUS | Status: AC
  Administered 2022-08-11: 5 mg via INTRAVENOUS
  Filled 2022-08-11: qty 10

## 2022-08-11 MED ORDER — DILTIAZEM HCL 25 MG/5ML IV SOLN
5.0000 mg | Freq: Once | INTRAVENOUS | Status: AC
  Administered 2022-08-11: 5 mg via INTRAVENOUS

## 2022-08-11 MED ORDER — NITROGLYCERIN 0.4 MG SL SUBL
0.8000 mg | SUBLINGUAL_TABLET | Freq: Once | SUBLINGUAL | Status: AC
  Administered 2022-08-11: 0.8 mg via SUBLINGUAL

## 2022-08-11 MED ORDER — DILTIAZEM HCL 25 MG/5ML IV SOLN
INTRAVENOUS | Status: AC
  Filled 2022-08-11: qty 5

## 2022-08-11 NOTE — Progress Notes (Signed)
Previously spoke with Dr. Marlou Porch in regards to patient HR and advised that patient had been given '5mg'$  Metoprolol and '5mg'$  Cardizem with no affect on HR.  He advised to administer '5mg'$  more of Metoprolol and if no affect, and administer '5mg'$  Cardizem.   Patient stated that she did not think she wished to proceed with study at this time due to anxiety about receiving contrast dye. Patient advised of risks and benefits of study and reassured that all precautions were taken to ensure safety and patient continued to state that she did not wish to proceed at this time.    Merle notified of same.

## 2022-08-19 ENCOUNTER — Telehealth: Payer: Self-pay | Admitting: Cardiovascular Disease

## 2022-08-19 ENCOUNTER — Encounter: Payer: Self-pay | Admitting: Cardiovascular Disease

## 2022-08-19 DIAGNOSIS — R0609 Other forms of dyspnea: Secondary | ICD-10-CM

## 2022-08-19 NOTE — Telephone Encounter (Signed)
Called and spoke with patient who agrees to lexiscan stress test. Instructions sent via [place]. She understands she will be called to schedule. Routing to MD to sign attestation.

## 2022-08-19 NOTE — Telephone Encounter (Signed)
-----   Message from Thayer Headings, MD sent at 08/17/2022  8:00 AM EST ----- Patient was concerned about getting IV contrast so the coronary CTA was cancelled and a coronary calcium score was performed  CAC socre is 67, 98th percentile of age / sex matched controls   Please schedule for her a [facility] for evaluation of her DOE .

## 2022-08-20 ENCOUNTER — Encounter (HOSPITAL_COMMUNITY): Payer: Self-pay | Admitting: Cardiovascular Disease

## 2022-08-20 DIAGNOSIS — I10 Essential (primary) hypertension: Secondary | ICD-10-CM | POA: Diagnosis not present

## 2022-08-20 DIAGNOSIS — Z299 Encounter for prophylactic measures, unspecified: Secondary | ICD-10-CM | POA: Diagnosis not present

## 2022-08-20 DIAGNOSIS — M25562 Pain in left knee: Secondary | ICD-10-CM | POA: Diagnosis not present

## 2022-08-24 ENCOUNTER — Encounter (HOSPITAL_COMMUNITY): Payer: No Typology Code available for payment source

## 2022-08-25 DIAGNOSIS — G35 Multiple sclerosis: Secondary | ICD-10-CM | POA: Diagnosis not present

## 2022-08-25 DIAGNOSIS — J029 Acute pharyngitis, unspecified: Secondary | ICD-10-CM | POA: Diagnosis not present

## 2022-08-25 DIAGNOSIS — J449 Chronic obstructive pulmonary disease, unspecified: Secondary | ICD-10-CM | POA: Diagnosis not present

## 2022-08-28 ENCOUNTER — Telehealth (HOSPITAL_COMMUNITY): Payer: Self-pay | Admitting: *Deleted

## 2022-08-28 NOTE — Telephone Encounter (Signed)
Patient given detailed instructions per Myocardial Perfusion Study Information Sheet for the test on 09/02/2022 at 7:45. Patient notified to arrive 15 minutes early and that it is imperative to arrive on time for appointment to keep from having the test rescheduled.  If you need to cancel or reschedule your appointment, please call the office within 24 hours of your appointment. . Patient verbalized understanding.Vanessa Mcconnell

## 2022-09-02 ENCOUNTER — Ambulatory Visit (HOSPITAL_COMMUNITY): Payer: No Typology Code available for payment source | Attending: Cardiology

## 2022-09-02 DIAGNOSIS — R0609 Other forms of dyspnea: Secondary | ICD-10-CM | POA: Diagnosis not present

## 2022-09-02 LAB — MYOCARDIAL PERFUSION IMAGING
LV dias vol: 95 mL (ref 46–106)
LV sys vol: 34 mL
Nuc Stress EF: 64 %
Peak HR: 131 {beats}/min
Rest HR: 64 {beats}/min
Rest Nuclear Isotope Dose: 10.2 mCi
SDS: 0
SRS: 0
SSS: 0
ST Depression (mm): 0 mm
Stress Nuclear Isotope Dose: 30.2 mCi
TID: 1.18

## 2022-09-02 MED ORDER — TECHNETIUM TC 99M TETROFOSMIN IV KIT
30.2000 | PACK | Freq: Once | INTRAVENOUS | Status: AC | PRN
  Administered 2022-09-02: 30.2 via INTRAVENOUS

## 2022-09-02 MED ORDER — REGADENOSON 0.4 MG/5ML IV SOLN
0.4000 mg | Freq: Once | INTRAVENOUS | Status: AC
  Administered 2022-09-02: 0.4 mg via INTRAVENOUS

## 2022-09-02 MED ORDER — TECHNETIUM TC 99M TETROFOSMIN IV KIT
10.2000 | PACK | Freq: Once | INTRAVENOUS | Status: AC | PRN
  Administered 2022-09-02: 10.2 via INTRAVENOUS

## 2022-09-10 ENCOUNTER — Encounter: Payer: Self-pay | Admitting: Radiology

## 2022-10-27 NOTE — Progress Notes (Deleted)
Cardiology Office Note:    Date:  10/27/2022   ID:  Vanessa Mcconnell, DOB 24-Mar-1961, MRN [id number]  PCP:  Ignatius Specking, MD   [facility] Providers Cardiologist:  Kristeen Miss, MD     Referring MD: Ignatius Specking, MD   Chief Complaint: follow-up leg edema   History of Present Illness:    Vanessa Mcconnell is a very pleasant 61 y.o. 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 She takes prazosin for PTSD.   Previously seen by Dr. Diona Browner in 2018 for intermittent chest discomfort.  She had had recent admission at [facility] which showed normal troponin T levels and normal echocardiogram.  Stress test was ordered which revealed low risk without large ischemic territory to suggest major obstructive CAD, LVEF normal. Echocardiogram from 2018 revealed normal LVEF, mild mitral valve prolapse with mild MR  Seen in clinic on 09/08/2021 by Dr. Elease Hashimoto for evaluation of leg swelling. Had been sitting with her in parents who were both in hospice for 11 days and eating only take out.  Edema had improved over the month between the death of her parents and office visit.  She was advised to discontinue lisinopril-HCTZ and to resume amlodipine 5 mg and to return in 3 months. She had a seizure and fell in the parking lot and had multiple injuries in January.  Seen in clinic by me on 12/09/21 follow-up of leg swelling. Has some dyspnea on exertion.  Reports she is recovering from bronchitis for which she took antibiotics and prednisone. PCP started her on HCTZ and potassium supplement for leg edema. She has a history of hypokalemia. Recently joined the gym and is working on improving her diet in order to lose weight. Has to be careful at the gym due to multiple injuries she suffered following a seizure and fall that occurred in January. One episode of palpitations lying in bed on 5/26, thinks may have been 2/2  recalling the date was 4 months since parents' death. Approximately one week ago, she got dizzy while walking on the treadmill.she was diagnosed with bronchitis the following day. No chest pain, fatigue, melena, hematuria, hemoptysis, diaphoresis, weakness, presyncope, syncope, orthopnea, and PND.  Seen again by Dr. Elease Hashimoto on 08/06/22 at which time coronary CTA was ordered for dyspnea on exertion. Lipoprotein a was obtained, 24.6.  Patient was concerned about getting IV contrast so CTA was canceled and coronary calcium score was completed.  CAC score 609, 98th percentile for age/sex matched controls. She underwent Lexiscan Myoview which revealed no evidence of ischemia or prior infarction. She was advised to return in 3 months for follow-up.  Today, she is here    Past Medical History:  Diagnosis Date   Arthritis    Asthma    Essential hypertension    Fibromyalgia    History of migraine    Hyperlipidemia    Malignant hyperthermia    Reportedly at age 34 with tonsillectomy   Pancreatitis    Reportedly single episode in childhood   Small intestinal bacterial overgrowth 12/2015   Positive breath test   Thoracic disc herniation     Past Surgical History:  Procedure Laterality Date   ABDOMINAL HYSTERECTOMY     BREAST BIOPSY Left 1990's   3 areas biopsied at different times, all with needle not excisional   COLONOSCOPY N/A 02/11/2015   [id number] diverticulosis in the sigmoid colon/moderate internal hemorrhoids   DEBRIDEMENT LEG  cellulitis debridement as a teen   ESOPHAGEAL DILATION  02/11/2015   Procedure: ESOPHAGEAL DILATION;  Surgeon: West Bali, MD;  Location: [facility];  Service: Endoscopy;;   ESOPHAGOGASTRODUODENOSCOPY N/A 02/11/2015   [id number] size HH/mild non-erosive gastritis   strep infection     causing skin infection, debridement buttocks, hip, legs   TONSILLECTOMY     ULNER NERVE SURGERY     Right Elbow    Current Medications: No outpatient medications  have been marked as taking for the 11/05/22 encounter (Appointment) with Levi Aland, NP.     Allergies:   Anesthesia s-i-40 [propofol], Benadryl [diphenhydramine hcl], Erythromycin, Iohexol, Latex, Penicillins, and Tetracyclines & related   Social History   Socioeconomic History   Marital status: Married    Spouse name: Not on file   Number of children: Not on file   Years of education: Not on file   Highest education level: Not on file  Occupational History   Not on file  Tobacco Use   Smoking status: Never   Smokeless tobacco: Never   Tobacco comments:    Never smoked  Vaping Use   Vaping Use: Never used  Substance and Sexual Activity   Alcohol use: No    Alcohol/week: 0.0 standard drinks of alcohol   Drug use: No   Sexual activity: Not on file  Other Topics Concern   Not on file  Social History Narrative   Right handed   Caffeine use: daily   Social Determinants of Health   Financial Resource Strain: Not on file  Food Insecurity: Not on file  Transportation Needs: Not on file  Physical Activity: Not on file  Stress: Not on file  Social Connections: Not on file     Family History: The patient's family history includes CAD in her sister; Congestive Heart Failure in her sister; Diabetes in her father; Epilepsy in her sister; Heart attack in her father and maternal grandfather; Heart disease in her son and son; Hypertension in her father and mother; Other in her sister; Peripheral Artery Disease in her sister; Supraventricular tachycardia in her sister. There is no history of Colon cancer or Breast cancer.  ROS:   Please see the history of present illness. ***  All other systems reviewed and are negative.  Labs/Other Studies Reviewed:    The following studies were reviewed today:  Lexiscan Myoview 09/02/22    The study is normal. The study is low risk.   No ST deviation was noted.   LV perfusion is normal. There is no evidence of ischemia. There is no  evidence of infarction.   Left ventricular function is normal. Nuclear stress EF: 64 %. The left ventricular ejection fraction is normal (55-65%). End diastolic cavity size is mildly enlarged. End systolic cavity size is mildly enlarged.   Prior study not available for comparison.  CT Modified calcium score 08/11/22 Coronary arteries: Normal origins.   Coronary Calcium Score:   Left main: 0   Left anterior descending artery: 397   Left circumflex artery: 196   Right coronary artery: 16   Total: 609   Percentile: 98   Pericardium: Normal.   Ascending Aorta: Normal caliber.  33 mm.  Mild atherosclerosis.   Non-cardiac: See separate report from [facility].   IMPRESSION: Coronary calcium score of 609. This was 52 percentile for age-, race-, and sex-matched controls.   Echo 12/30/21  1. Left ventricular ejection fraction, by estimation, is 60 to 65%. The  left ventricle  has normal function. The left ventricle has no regional  wall motion abnormalities. There is mild left ventricular hypertrophy.  Left ventricular diastolic parameters  are consistent with Grade I diastolic dysfunction (impaired relaxation).   2. Right ventricular systolic function is normal. The right ventricular  size is normal. Tricuspid regurgitation signal is inadequate for assessing  PA pressure.   3. Left atrial size was mildly dilated.   4. The mitral valve is grossly normal. Trivial mitral valve  regurgitation.   5. The aortic valve is tricuspid. Aortic valve regurgitation is not  visualized.   6. Aortic no significant aortic root/ascending aneurysm.   7. The inferior vena cava is normal in size with greater than 50%  respiratory variability, suggesting right atrial pressure of 3 mmHg.   Comparison(s): No prior Echocardiogram.   Nuclear Stress 06/30/2017  No diagnostic ST segment changes to indicate ischemia. Small, mild intensity, basal septal defect that is most consistent with  variable soft tissue attenuation. No large ischemic territories noted. This is a low risk study. Nuclear stress EF: 61%.   Echo 06/02/2017 Normal LVEF 55-60%, normal diastolic function Normal RV Mild mitral valve prolapse, mild MR  Recent Labs: 07/30/2022: Hemoglobin 15.6; Platelets 356 08/06/2022: BUN 12; Creatinine, Ser 0.92; Potassium 4.9; Sodium 135  Recent Lipid Panel No results found for: "CHOL", "TRIG", "HDL", "CHOLHDL", "VLDL", "LDLCALC", "LDLDIRECT"   Risk Assessment/Calculations:       Physical Exam:    VS:  There were no vitals taken for this visit.    Wt Readings from Last 3 Encounters:  08/06/22 174 lb (78.9 kg)  07/30/22 178 lb 12.8 oz (81.1 kg)  05/27/22 176 lb (79.8 kg)     GEN:  Well nourished, well developed in no acute distress HEENT: Normal NECK: No JVD; No carotid bruits CARDIAC: RRR, no murmurs, rubs, gallops RESPIRATORY:  Clear to auscultation without rales, wheezing or rhonchi  ABDOMEN: Soft, non-tender, non-distended MUSCULOSKELETAL:  No edema; No deformity. 2+ pedal pulses, equal bilaterally SKIN: Warm and dry NEUROLOGIC:  Alert and oriented x 3 PSYCHIATRIC:  Normal affect   EKG:  EKG is ***  Diagnoses:    No diagnosis found.  Assessment and Plan:     CAD: Elevated coronary calcium score 609 on CT 08/11/2022 with follow-up nuclear stress test without evidence of ischemia or infarction.   Hyperlipidemia LDL goal < 70:  Leg edema: She was started on HCTZ by PCP last week.  Was advised to monitor weight on a daily basis which she will start tomorrow due to recently taking prednisone for bronchitis. Feels leg swelling has improved. I do not note significant swelling on exam. She is exercising on a more consistent basis and avoiding high sodium processed foods.  We will check bmet today due to recent addition of HCTZ and potassium.   Hypertension: BP is well-controlled. No medication changes today.   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 I do not appreciate a significant murmur on exam.  We will recheck echocardiogram to assess valve function since last echo was 2018.   Obesity: She is working on weight loss, heart healthy diet and increasing physical activity. I provided information on the Mediterranean eating plan and encouraged 150 minutes moderate intensity exercise each week.    Disposition:  ***   Medication Adjustments/Labs and Tests Ordered: Current medicines are reviewed at length with the patient today.  Concerns regarding medicines are outlined above.  No orders of  the defined types were placed in this encounter.  No orders of the defined types were placed in this encounter.   There are no Patient Instructions on file for this visit.   Signed, Levi Aland, NP  10/27/2022 5:40 AM    [facility]

## 2022-11-03 ENCOUNTER — Ambulatory Visit: Payer: No Typology Code available for payment source | Admitting: Internal Medicine

## 2022-11-05 ENCOUNTER — Ambulatory Visit: Payer: No Typology Code available for payment source | Admitting: Nurse Practitioner

## 2022-11-05 DIAGNOSIS — Z7189 Other specified counseling: Secondary | ICD-10-CM | POA: Diagnosis not present

## 2022-11-05 DIAGNOSIS — Z1331 Encounter for screening for depression: Secondary | ICD-10-CM | POA: Diagnosis not present

## 2022-11-05 DIAGNOSIS — I10 Essential (primary) hypertension: Secondary | ICD-10-CM | POA: Diagnosis not present

## 2022-11-05 DIAGNOSIS — Z6830 Body mass index (BMI) 30.0-30.9, adult: Secondary | ICD-10-CM | POA: Diagnosis not present

## 2022-11-05 DIAGNOSIS — Z299 Encounter for prophylactic measures, unspecified: Secondary | ICD-10-CM | POA: Diagnosis not present

## 2022-11-05 DIAGNOSIS — G35 Multiple sclerosis: Secondary | ICD-10-CM | POA: Diagnosis not present

## 2022-11-05 DIAGNOSIS — J449 Chronic obstructive pulmonary disease, unspecified: Secondary | ICD-10-CM | POA: Diagnosis not present

## 2022-11-05 DIAGNOSIS — G319 Degenerative disease of nervous system, unspecified: Secondary | ICD-10-CM | POA: Diagnosis not present

## 2022-11-05 DIAGNOSIS — Z1339 Encounter for screening examination for other mental health and behavioral disorders: Secondary | ICD-10-CM | POA: Diagnosis not present

## 2022-11-05 DIAGNOSIS — G959 Disease of spinal cord, unspecified: Secondary | ICD-10-CM | POA: Diagnosis not present

## 2022-11-05 DIAGNOSIS — Z0000 Encounter for general adult medical examination without abnormal findings: Secondary | ICD-10-CM | POA: Diagnosis not present

## 2022-11-24 NOTE — Progress Notes (Unsigned)
[facility]  PATIENT: Vanessa Mcconnell DOB: 1960-10-09  REFERRING DOCTOR OR PCP:  Dr. Sherril Croon; Jorge Mandril, NP SOURCE: Patient, notes, imaging and lab reports, MRI images personally reviewed.  _________________________________   HISTORICAL  CHIEF COMPLAINT:  No chief complaint on file.   HISTORY OF PRESENT ILLNESS:  Vanessa Mcconnell is a 62 y.o. woman with white matter changes on MRI  seizures  Update 11/24/2022 Since the last visit we checked MRI of the cervical spine due to her mild gait disturbance and increased reflexes..  It showed degenerative changes at C5-C6 and C6-C7 but no spinal cord compression or nerve root compression.  *** She is a 62 yo woman who has had multiple spells of altered awareness.  Both of her parents died 08/12/2021.  While at a service she fell and broke the right hand and wrist and separated the shoulder and had rib fractures.  She had a brief LOC lasting a couple minutes.  She just recalls coming to and felt groggy that day.    She was casted and had another fell 2 months later.   She has had 2 more falls.     Last week after the fall she had severe neck pain and reduced ROM.   Her left leg is giving out.     She called [facility] and was referred back here.     Seizures were considered and she had an EEG reportedly showing findings consistent with partial complex seizures.   We do not have the results (performed in 2022 at [facility]).   It was done due to spells of altered awareness starting with lightheadedness/dizziness.  She could feel these come on and the spells would last a minute or so..   If talking to someone she would not respond and she could not hear them.   She would come back to herself quickly.   Spells at night would last longer and she felt confusion.    She was placed on Keppra ER 2250 mg po qHS.       I previously saw her for episodes of reduced focus and blurry vision.   She had an abnormal MRI  shows nonspecific foci predominantly in subcortical white matter -these are much more consistent with chronic microvascular ischemic change than demyelination.   No definite change compared to 2019.   MRI cervical spine 09/12/2020 showed a normal spinal cord.  She had a left paramedian protrusion at C5C6 causing mild spinal stenosis.   She also saw Dr. Everlena Cooper who checked an MRI of the thoracic spine (spinal cord normla has some protrusions to the right at T6T7 and T7T8, left at T9T10 and T10T11 but no nerve root compression or significant spinal stenosis).   She has had a lumbar puncture 11/22/2020.   IgG index was normal.   She had no oligoclonal bands (3 paired bands in CSF/serum usually just incidental finding).   She was referred to me and I felt MS was very unlikely with MRI appearance and CSF results.  She most likely had  mild chronic microvascular ischemic change or sequela of migraine headaches  Due to numbness and dysesthesia I checked neuropathy labs (normal)   She is on duloxetine and gabapentin for fibromyalgia pain.  She also takes tizanidine for muscle spasms but she takes as needed as it makes her sleepy.   She has mild depression and anxiety.   Vascular risks:   She has HTN but no smoking history or DM.  She has tachycardia on metoprolol  Imaging: MRI of the cervical spine 06/29/2022 showed a normal spinal cord.  At C4-5: Small central disc protrusion indents the thecal sac slightly but does not affect the cord or show foraminal extension.  At C5-6: Endplate osteophytes and bulging of the disc more prominent towards the left. Narrowing of the ventral subarachnoid space but no compressive effect upon the cord or foraminal extension.  Stable compared to 2022.  MRI of the brain 02/23/2022 shows a small number of small T2/FLAIR hyperintense foci predominantly in the subcortical and deep white matter of the frontal lobes.  None of these appear to be acute.  They do not enhance.  This is unchanged  compared to the MRI from 09/12/2020  MRI of the thoracic spine 11/13/2020 shows a normal spinal cord.  There areprotrusions to the right at T6T7 and T7T8, left at T9T10 and T10T11 but no nerve root compression or significant spinal stenosis .  MR angiogram of the head 11/04/2020 was normal  MRI of the brain 09/12/2020 shows scattered T2/FLAIR hyperintense foci in the subcortical and deep white matter.  The pattern is nonspecific and is not typical for demyelination  MRI of the cervical spine 09/12/2020 shows left paramedian disc disc protrusion at C5-C6 causing mild spinal stenosis but no nerve root compression  Labs: CSF 11/22/2020 showed 3 identical gamma restriction bands in the CSF and serum.  IgG index was normal.  Paired bands in the CSF and serum is usually just an incidental finding.  Labs 01/06/2021 showed normal B12, SSA/SSB and unremarkable SPEP/IEF   REVIEW OF SYSTEMS: Constitutional: No fevers, chills, sweats, or change in appetite Eyes: No visual changes, double vision, eye pain Ear, nose and throat: No hearing loss, ear pain, nasal congestion, sore throat Cardiovascular: No chest pain, palpitations Respiratory:  No shortness of breath at rest or with exertion.   No wheezes GastrointestinaI: No nausea, vomiting, diarrhea, abdominal pain, fecal incontinence Genitourinary:  No dysuria, urinary retention or frequency.  No nocturia. Musculoskeletal:  No neck pain, back pain Integumentary: No rash, pruritus, skin lesions Neurological: as above Psychiatric: No depression at this time.  No anxiety Endocrine: No palpitations, diaphoresis, change in appetite, change in weigh or increased thirst Hematologic/Lymphatic:  No anemia, purpura, petechiae. Allergic/Immunologic: No itchy/runny eyes, nasal congestion, recent allergic reactions, rashes  ALLERGIES: Allergies  Allergen Reactions   Anesthesia S-I-40 [Propofol]    Benadryl [Diphenhydramine Hcl] Hives and Other (See Comments)     Passed out   Erythromycin Hives   Iohexol      Code: HIVES, Desc: Anaphylaxis/Hives, Onset Date: [id number]    Latex Rash   Penicillins Swelling and Rash    Has patient had a PCN reaction causing immediate rash, facial/tongue/throat swelling, SOB or lightheadedness with hypotension: Yes Has patient had a PCN reaction causing severe rash involving mucus membranes or skin necrosis: No Has patient had a PCN reaction that required hospitalization: No Has patient had a PCN reaction occurring within the last 10 years: No If all of the above answers are "NO", then may proceed with Cephalosporin use.   Tetracyclines & Related Rash    HOME MEDICATIONS:  Current Outpatient Medications:    albuterol (PROVENTIL HFA;VENTOLIN HFA) 108 (90 Base) MCG/ACT inhaler, Inhale into the lungs every 6 (six) hours as needed for wheezing or shortness of breath., Disp: , Rfl:    alendronate (FOSAMAX) 70 MG tablet, Take 70 mg by mouth once a week., Disp: , Rfl:    amLODipine (NORVASC)  5 MG tablet, Take 1 tablet (5 mg total) by mouth daily., Disp: 90 tablet, Rfl: 3   Budeson-Glycopyrrol-Formoterol (BREZTRI AEROSPHERE) 160-9-4.8 MCG/ACT AERO, Inhale 2 puffs into the lungs 2 (two) times daily., Disp: 10.7 g, Rfl: 11   Cholecalciferol (VITAMIN D3) 125 MCG (5000 UT) TABS, Take by mouth. Per patient taking with Calcium, Disp: , Rfl:    diphenhydrAMINE (BENADRYL) 50 MG capsule, Take one capsule 1 hour prior to scan., Disp: 1 capsule, Rfl: 0   DULoxetine (CYMBALTA) 60 MG capsule, Take 60 mg by mouth daily., Disp: , Rfl:    fluticasone (FLONASE) 50 MCG/ACT nasal spray, as needed., Disp: , Rfl:    gabapentin (NEURONTIN) 300 MG capsule, One po qAM, one po qPM, two po qhs, Disp: 120 capsule, Rfl: 11   hydrochlorothiazide (MICROZIDE) 12.5 MG capsule, Take 12.5 mg by mouth daily., Disp: , Rfl:    HYDROcodone-acetaminophen (NORCO/VICODIN) 5-325 MG tablet, Take 1 tablet by mouth every 6 (six) hours as needed for severe pain., Disp: 15  tablet, Rfl: 0   levETIRAcetam (KEPPRA) 750 MG tablet, Take 750 mg by mouth daily. 2 TABLETS AT BEDTIME, Disp: , Rfl:    meclizine (ANTIVERT) 25 MG tablet, Take 25 mg by mouth 3 (three) times daily as needed., Disp: , Rfl:    metoprolol succinate (TOPROL-XL) 25 MG 24 hr tablet, Take 25 mg by mouth 2 (two) times daily., Disp: , Rfl: 5   metoprolol tartrate (LOPRESSOR) 25 MG tablet, Take 1 tablet (25 mg total) by mouth once for 1 dose. Take 90-120 minutes prior to scan., Disp: 1 tablet, Rfl: 0   ondansetron (ZOFRAN) 4 MG tablet, Take 4 mg by mouth as needed., Disp: , Rfl:    pantoprazole (PROTONIX) 40 MG tablet, Take 1 tablet (40 mg total) by mouth daily. 30 minutes before breakfast, Disp: 90 tablet, Rfl: 3   potassium chloride (MICRO-K) 10 MEQ CR capsule, Take 10 mEq by mouth daily., Disp: , Rfl:    prazosin (MINIPRESS) 2 MG capsule, Take 2 mg by mouth at bedtime and may repeat dose one time if needed., Disp: , Rfl:    predniSONE (DELTASONE) 50 MG tablet, Take one tablet 13 hours, 7 hours, and 1 hour prior to scan., Disp: 3 tablet, Rfl: 0   rosuvastatin (CRESTOR) 5 MG tablet, Take 5 mg by mouth once a week., Disp: , Rfl:    vitamin B-12 (CYANOCOBALAMIN) 100 MCG tablet, Take 100 mcg by mouth daily. Per patient taking a gummie, Disp: , Rfl:    vitamin C (ASCORBIC ACID) 500 MG tablet, Take 500 mg by mouth daily., Disp: , Rfl:    zinc gluconate 50 MG tablet, Take 50 mg by mouth daily., Disp: , Rfl:   PAST MEDICAL HISTORY: Past Medical History:  Diagnosis Date   Arthritis    Asthma    Essential hypertension    Fibromyalgia    History of migraine    Hyperlipidemia    Malignant hyperthermia    Reportedly at age 65 with tonsillectomy   Pancreatitis    Reportedly single episode in childhood   Small intestinal bacterial overgrowth 12/2015   Positive breath test   Thoracic disc herniation     PAST SURGICAL HISTORY: Past Surgical History:  Procedure Laterality Date   ABDOMINAL HYSTERECTOMY      BREAST BIOPSY Left 1990's   3 areas biopsied at different times, all with needle not excisional   COLONOSCOPY N/A 02/11/2015   IEP:PIRJJOAC diverticulosis in the sigmoid colon/moderate internal hemorrhoids  DEBRIDEMENT LEG     cellulitis debridement as a teen   ESOPHAGEAL DILATION  02/11/2015   Procedure: ESOPHAGEAL DILATION;  Surgeon: West Bali, MD;  Location: [facility];  Service: Endoscopy;;   ESOPHAGOGASTRODUODENOSCOPY N/A 02/11/2015   WUJ:[id number] size HH/mild non-erosive gastritis   strep infection     causing skin infection, debridement buttocks, hip, legs   TONSILLECTOMY     ULNER NERVE SURGERY     Right Elbow    FAMILY HISTORY: Family History  Problem Relation Age of Onset   Hypertension Mother    Diabetes Father    Heart attack Father        CABG X'3 & stent placement   Hypertension Father    Epilepsy Sister    Supraventricular tachycardia Sister    Other Sister        "hole in heart"   Heart attack Maternal Grandfather    Peripheral Artery Disease Sister    CAD Sister    Congestive Heart Failure Sister        mild diastolic heart failure   Heart disease Son    Heart disease Son    Colon cancer Neg Hx    Breast cancer Neg Hx     SOCIAL HISTORY: Social History   Socioeconomic History   Marital status: Married    Spouse name: Not on file   Number of children: Not on file   Years of education: Not on file   Highest education level: Not on file  Occupational History   Not on file  Tobacco Use   Smoking status: Never   Smokeless tobacco: Never   Tobacco comments:    Never smoked  Vaping Use   Vaping Use: Never used  Substance and Sexual Activity   Alcohol use: No    Alcohol/week: 0.0 standard drinks of alcohol   Drug use: No   Sexual activity: Not on file  Other Topics Concern   Not on file  Social History Narrative   Right handed   Caffeine use: daily   Social Determinants of Health   Financial Resource Strain: Not on file  Food  Insecurity: Not on file  Transportation Needs: Not on file  Physical Activity: Not on file  Stress: Not on file  Social Connections: Not on file  Intimate Partner Violence: Not on file       PHYSICAL EXAM  There were no vitals filed for this visit.   There is no height or weight on file to calculate BMI.   General: The patient is well-developed and well-nourished and in no acute distress  HEENT:  Head is [place]/AT.  Sclera are anicteric.  Funduscopic exam shows normal optic discs and retinal vessels.  Neck: No carotid bruits are noted.  The neck is nontender.  Cardiovascular: The heart has a regular rate and rhythm with a normal S1 and S2. There were no murmurs, gallops or rubs.    Skin: Extremities are without rash or  edema.  Musculoskeletal:  Back is nontender  Neurologic Exam  Mental status: The patient is alert and oriented x 3 at the time of the examination. The patient has apparent normal recent and remote memory, with an apparently normal attention span and concentration ability.   Speech is normal.  Cranial nerves: Extraocular movements are full. Pupils are equal, round, and reactive to light and accomodation.  Visual fields are full.  Facial symmetry is present. There is good facial sensation to soft touch bilaterally.Facial strength is normal.  Trapezius and sternocleidomastoid strength is normal. No dysarthria is noted.  The tongue is midline, and the patient has symmetric elevation of the soft palate. No obvious hearing deficits are noted.  Motor:  Muscle bulk is normal.   Tone is normal. Strength is  5 / 5 in all 4 extremities.   Sensory: Sensory testing is intact to pinprick, soft touch and vibration sensation in arms but reduced vibration in left leg relative the right.    Coordination: Cerebellar testing reveals good finger-nose-finger and heel-to-shin bilaterally.  Gait and station: Station is normal.   Gait is normal. Tandem gait is wide. Romberg is negative.    Reflexes: Deep tendon reflexes are symmetric and normal in arms but increased at knees (3+ with spread and ankles, 3 no clonus.  .   Plantar responses are flexor.    DIAGNOSTIC DATA (LABS, IMAGING, TESTING) - I reviewed patient records, labs, notes, testing and imaging myself where available.  Lab Results  Component Value Date   WBC 7.8 07/30/2022   HGB 15.6 07/30/2022   HCT 45.5 07/30/2022   MCV 90 07/30/2022   PLT 356 07/30/2022      Component Value Date/Time   NA 135 08/06/2022 0827   K 4.9 08/06/2022 0827   CL 97 08/06/2022 0827   CO2 21 08/06/2022 0827   GLUCOSE 87 08/06/2022 0827   GLUCOSE 120 (H) 08/27/2017 0840   BUN 12 08/06/2022 0827   CREATININE 0.92 08/06/2022 0827   CREATININE 0.64 02/05/2015 0941   CALCIUM 9.8 08/06/2022 0827   PROT 7.0 01/06/2021 1126   ALBUMIN 4.5 11/22/2020 1137   AST 16 08/26/2017 1453   ALT 17 08/26/2017 1453   ALKPHOS 125 08/26/2017 1453   BILITOT 0.7 08/26/2017 1453   GFRNONAA >60 08/27/2017 0840   GFRAA >60 08/27/2017 0840   No results found for: "CHOL", "HDL", "LDLCALC", "LDLDIRECT", "TRIG", "CHOLHDL" No results found for: "HGBA1C" Lab Results  Component Value Date   VITAMINB12 643 01/06/2021   Lab Results  Component Value Date   TSH 1.54 12/26/2015       ASSESSMENT AND PLAN  No diagnosis found.    We will try to get 2022 EEG results and check another EEG while on levetiracetam.   For now continue levetiracetam.   MRI cervical spine to r/o myelopathy due to falls and gait disturbance and mild hyper-reflexia at knees The mild changes on the MRI are most consistent with chronic microvascular ischemic change.  There is no evidence of MS.   Stay active and exercise as tolerated Rtc 6 months  45-minute office visit with the majority of the time spent face-to-face for history and physical, discussion/counseling and decision-making.  Additional time with record review and documentation.    Celest Reitz A. Epimenio Foot, MD,  [facility] 11/24/2022, 9:08 PM Certified in Neurology, Clinical Neurophysiology, Sleep Medicine and Neuroimaging  [facility] [address] [place], [place] [address] [phone]

## 2022-11-25 ENCOUNTER — Other Ambulatory Visit: Payer: Self-pay | Admitting: Neurology

## 2022-11-25 ENCOUNTER — Ambulatory Visit (INDEPENDENT_AMBULATORY_CARE_PROVIDER_SITE_OTHER): Payer: No Typology Code available for payment source | Admitting: Neurology

## 2022-11-25 ENCOUNTER — Encounter: Payer: Self-pay | Admitting: Neurology

## 2022-11-25 VITALS — BP 136/92 | HR 71 | Ht 62.0 in | Wt 178.0 lb

## 2022-11-25 DIAGNOSIS — R200 Anesthesia of skin: Secondary | ICD-10-CM | POA: Diagnosis not present

## 2022-11-25 DIAGNOSIS — M797 Fibromyalgia: Secondary | ICD-10-CM

## 2022-11-25 DIAGNOSIS — F418 Other specified anxiety disorders: Secondary | ICD-10-CM

## 2022-11-25 DIAGNOSIS — R9082 White matter disease, unspecified: Secondary | ICD-10-CM

## 2022-11-25 DIAGNOSIS — F4310 Post-traumatic stress disorder, unspecified: Secondary | ICD-10-CM

## 2022-11-25 DIAGNOSIS — R2681 Unsteadiness on feet: Secondary | ICD-10-CM

## 2022-11-25 DIAGNOSIS — R404 Transient alteration of awareness: Secondary | ICD-10-CM

## 2022-11-25 MED ORDER — TIZANIDINE HCL 4 MG PO CAPS: 4.0000 mg | ORAL_CAPSULE | Freq: Two times a day (BID) | ORAL | 5 refills | Status: DC | PRN

## 2022-11-25 MED ORDER — GABAPENTIN 300 MG PO CAPS: ORAL_CAPSULE | ORAL | 11 refills | Status: DC

## 2022-11-25 MED ORDER — DULOXETINE HCL 60 MG PO CPEP: 60.0000 mg | ORAL_CAPSULE | Freq: Every day | ORAL | 3 refills | Status: DC

## 2022-11-25 MED ORDER — LEVETIRACETAM 750 MG PO TABS: 750.0000 mg | ORAL_TABLET | Freq: Two times a day (BID) | ORAL | 11 refills | Status: DC

## 2022-11-26 ENCOUNTER — Telehealth: Payer: Self-pay | Admitting: Neurology

## 2022-11-26 MED ORDER — TIZANIDINE HCL 4 MG PO TABS: 4.0000 mg | ORAL_TABLET | Freq: Two times a day (BID) | ORAL | 11 refills | Status: DC | PRN

## 2022-11-26 MED ORDER — LEVETIRACETAM ER 750 MG PO TB24: 2250.0000 mg | ORAL_TABLET | Freq: Every day | ORAL | 11 refills | Status: DC

## 2022-11-26 NOTE — Addendum Note (Signed)
Addended by: Arther Abbott on: 11/26/2022 04:13 PM   Modules accepted: Orders

## 2022-11-26 NOTE — Telephone Encounter (Signed)
Dr. Epimenio Foot- can you clarify what dose levetiracetam you want pt on?  Reviewed MD note from yesterday. He had documented hx for pt: "She was placed on Keppra ER 2250 mg po qHS."  However, he called in: levetiracetam 750mg  po BID by Dr. Epimenio Foot.     Dr. Epimenio Foot called in tizanidine 4mg  capsule. Sent in tablet instead to pharmacy.

## 2022-11-26 NOTE — Telephone Encounter (Signed)
Called pt. 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 When she gets home, she is going to confirm what dose she is taking of levetiracetam and call us back. Aware we will call in rx to match what she is currently taking.

## 2022-11-26 NOTE — Telephone Encounter (Signed)
Took call from phone staff and spoke w/ pt.  She currently is taking levetiracetam ER 750mg , 3 tabs po qhs. Aware we will call this in instead and ask [facility] rx called in by Dr. Epimenio Foot. She verbalized understanding and appreciation. I received VO from Dr. Epimenio Foot who approved calling in this dose instead after phone call.  Pharmacy:  [id number] [place], [place] - [address] AT [place] [address]

## 2022-11-26 NOTE — Telephone Encounter (Signed)
Pt called stating that her levETIRAcetam (KEPPRA) 750 MG tablet was 3 times daily but the rx says twice a day and does not say XR. Pharmacy would like clarify. Also on the tiZANidine (ZANAFLEX) 4 MG capsule the insurance will not cover it unless it is in tablet form. Please advise.

## 2022-11-26 NOTE — Telephone Encounter (Signed)
Called pt back since no return call yet. Pt answered but then call ended on her end. I tried calling back but went straight to VM. I LVM for her to call office.

## 2022-12-03 ENCOUNTER — Other Ambulatory Visit: Payer: Self-pay | Admitting: Neurology

## 2022-12-13 NOTE — Progress Notes (Unsigned)
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 5 y f/b cards already Cough: clear mucus stuffy head and ear on cipro 1/17  Sleep: flat with one pillow SABA use: once a day up 3 x  02: none  Rec Plan A = Automatic = Always=    Breztri Take 2 puffs first thing in am and then another 2 puffs about 12 hours later.  Work on inhaler technique:  Plan B = Backup (to supplement plan A, not to replace it) Only use your albuterol inhaler as a rescue medication  Please schedule a follow up visit in 3 months but call sooner if needed  - PFTs at [facility] next available   Labs 07/30/2022  :    alpha one AT phenotype MM/ Eos 0.2 / IgE   5   12/14/2022  f/u ov/ office/Vanessa Mcconnell re: AB maint on Breztri   Chief Complaint  Patient presents with   Follow-up    Pt f/u states that she is feeling "weak/drowsy/dizzy", she reports that 6/2 she had a seizure in the early morning and when she went to take some ibuprofen she grabbed the wrong bottle and took 4- 75mg  diclofenac (husband's prescription)    Dyspnea:  eliptical x 15 min / rowing x 10 / bike 30 min  Cough: assoc nasal congestion  Sleeping: flat bed/ one pillow/ wheezing does not disturb her sleep SABA use: none  02: none      No obvious day to day or daytime variability or assoc excess/ purulent sputum or mucus plugs or  hemoptysis or cp or chest tightness, subjective wheeze or overt sinus or hb symptoms.     Also denies any obvious fluctuation of symptoms with weather or environmental changes or other aggravating or alleviating factors except as outlined above   No unusual exposure hx or h/o childhood pna/ asthma or knowledge of premature birth.  Current Allergies, Complete Past Medical History, Past Surgical History, Family History, and Social History were reviewed in [employer] record.  ROS  The following are not active complaints unless bolded Hoarseness, sore throat/globus , dysphagia, dental problems, itching, sneezing,  nasal congestion or discharge of excess mucus or purulent secretions, ear ache,   fever, chills, sweats, unintended wt loss or wt gain, classically pleuritic or exertional cp,  orthopnea pnd or arm/hand swelling  or leg swelling, presyncope, palpitations, abdominal pain, anorexia, nausea, vomiting, diarrhea  or change in bowel habits or change in bladder habits, change in stools or change in urine, dysuria, hematuria,  rash, arthralgias, visual complaints, headache, numbness, weakness or ataxia or problems with walking or coordination,  change in mood or  memory.        Current Meds  Medication Sig   albuterol (PROVENTIL HFA;VENTOLIN  HFA) 108 (90 Base) MCG/ACT inhaler Inhale into the lungs every 6 (six) hours as needed for wheezing or shortness of breath.   amLODipine (NORVASC) 5 MG tablet Take 1 tablet (5 mg total) by mouth daily.   Budeson-Glycopyrrol-Formoterol (BREZTRI AEROSPHERE) 160-9-4.8 MCG/ACT AERO Inhale 2 puffs into the lungs 2 (two) times daily.   Cholecalciferol (VITAMIN D3) 125 MCG (5000 UT) TABS Take by mouth. Per patient taking with Calcium   DULoxetine (CYMBALTA) 60 MG capsule Take 1 capsule (60 mg total) by mouth daily.   fluticasone (FLONASE) 50 MCG/ACT nasal spray as needed.   gabapentin (NEURONTIN) 300 MG capsule One po qAM, one po qNoon, one po  qEvening and two po qhs   hydrochlorothiazide (MICROZIDE) 12.5 MG capsule Take 12.5 mg by mouth daily.   levETIRAcetam (KEPPRA XR) 750 MG 24 hr tablet Take 2 tablets by mouth at bedtime, 9pm   meclizine (ANTIVERT) 25 MG tablet Take 25 mg by mouth 3 (three) times daily as needed.   metoprolol succinate (TOPROL-XL) 25 MG 24 hr tablet Take 25 mg by mouth 2 (two) times daily.   ondansetron (ZOFRAN) 4 MG tablet Take 4 mg by mouth as needed.   pantoprazole (PROTONIX) 40 MG tablet Take 1 tablet (40 mg total) by mouth daily. 30 minutes before breakfast   potassium chloride (MICRO-K) 10 MEQ CR capsule Take 10 mEq by mouth daily.   prazosin (MINIPRESS) 2 MG capsule Take 2 mg by mouth at bedtime and may repeat dose one time if needed.   rosuvastatin (CRESTOR) 5 MG tablet Take 5 mg by mouth once a week.   tiZANidine (ZANAFLEX) 4 MG tablet Take 1 tablet (4 mg total) by mouth 2 (two) times daily as needed for muscle spasms.   vitamin B-12 (CYANOCOBALAMIN) 100 MCG tablet Take 100 mcg by mouth daily. Per patient taking a gummie   vitamin C (ASCORBIC ACID) 500 MG tablet Take 500 mg by mouth daily.   zinc gluconate 50 MG tablet Take 50 mg by mouth daily.                     Past Medical History:  Diagnosis Date   Arthritis    Asthma    Essential hypertension    Fibromyalgia    History of migraine    Hyperlipidemia    Malignant hyperthermia    Reportedly at age 42 with tonsillectomy   Pancreatitis    Reportedly single episode in childhood   Small intestinal bacterial overgrowth 12/2015   Positive breath test   Thoracic disc herniation       Objective:     Wt Readings from Last 3 Encounters:  12/14/22 178 lb 3.2 oz (80.8 kg)  11/25/22 178 lb (80.7 kg)  08/06/22 174 lb (78.9 kg)      Vital signs reviewed  12/14/2022  - Note at rest 02 sats  96% on RA   General appearance:    pleasant amb wf nad   HEENT : Oropharynx  clear         NECK :  without  apparent JVD/ palpable Nodes/TM     LUNGS: no acc muscle use,  Nl contour chest which is clear to A and P bilaterally without cough on insp or exp maneuvers   CV:  RRR  no s3 or murmur or increase in P2, and no edema   ABD:  soft and nontender with nl inspiratory excursion in the supine position. No bruits or organomegaly appreciated  MS:  Nl gait/ ext warm without deformities Or obvious joint restrictions  calf tenderness, cyanosis or clubbing    SKIN: warm and dry without lesions    NEURO:  alert, approp, nl sensorium with  no motor or cerebellar deficits apparent.         Assessment

## 2022-12-14 ENCOUNTER — Encounter: Payer: Self-pay | Admitting: Internal Medicine

## 2022-12-14 ENCOUNTER — Other Ambulatory Visit: Payer: Self-pay | Admitting: Internal Medicine

## 2022-12-14 ENCOUNTER — Ambulatory Visit (INDEPENDENT_AMBULATORY_CARE_PROVIDER_SITE_OTHER): Payer: No Typology Code available for payment source | Admitting: Internal Medicine

## 2022-12-14 VITALS — BP 123/79 | HR 69 | Ht 62.0 in | Wt 178.2 lb

## 2022-12-14 DIAGNOSIS — J4489 Other specified chronic obstructive pulmonary disease: Secondary | ICD-10-CM | POA: Diagnosis not present

## 2022-12-14 DIAGNOSIS — I10 Essential (primary) hypertension: Secondary | ICD-10-CM | POA: Diagnosis not present

## 2022-12-14 MED ORDER — ALENDRONATE SODIUM 70 MG PO TABS: ORAL_TABLET | ORAL | Status: DC

## 2022-12-14 MED ORDER — FAMOTIDINE 20 MG PO TABS: ORAL_TABLET | ORAL | 11 refills | Status: AC

## 2022-12-14 MED ORDER — BUDESONIDE-FORMOTEROL FUMARATE 80-4.5 MCG/ACT IN AERO: INHALATION_SPRAY | RESPIRATORY_TRACT | 12 refills | Status: DC

## 2022-12-14 MED ORDER — BREZTRI AEROSPHERE 160-9-4.8 MCG/ACT IN AERO: 2.0000 | INHALATION_SPRAY | Freq: Two times a day (BID) | RESPIRATORY_TRACT | 0 refills | Status: DC

## 2022-12-14 NOTE — Patient Instructions (Addendum)
Pantoprazole (protonix) 40 mg  Take  30-60 min before first meal of the day and ADD  Pepcid (famotidine)  20 mg after supper /before bedtime until return to office - this is the best way to tell whether stomach acid is contributing to your problem.    GERD (REFLUX)  is an extremely common cause of respiratory symptoms just like yours , many times with no obvious heartburn at all.    It can be treated with medication, but also with lifestyle changes including elevation of the head of your bed (ideally with 6-8inch blocks under the headboard of your bed),  Smoking cessation, avoidance of late meals, excessive alcohol, and avoid fatty foods, chocolate, peppermint, colas, red wine, and acidic juices such as orange juice.  NO MINT OR MENTHOL PRODUCTS SO NO COUGH DROPS  USE SUGARLESS CANDY INSTEAD (Jolley ranchers or Stover's or Life Savers) or even ice chips will also do - the key is to swallow to prevent all throat clearing. NO OIL BASED VITAMINS - use powdered substitutes.  Avoid fish oil when coughing.   Plan A = Automatic = Always=    Breztri (symbicort 80) Take 2 puffs first thing in am and then another 2 puffs about 12 hours later.    Work on inhaler technique:  relax and gently blow all the way out then take a nice smooth full deep breath back in, triggering the inhaler at same time you start breathing in.  Hold breath in for at least  5 seconds if you can. Blow out Breztri/symbicort  thru nose. Rinse and gargle with water when done.  If mouth or throat bother you at all,  try brushing teeth/gums/tongue with arm and hammer toothpaste/ make a slurry and gargle and spit out.   >>>  Remember how golfers warm up by taking practice swings - do this with an empty inhaler   Plan B = Backup (to supplement plan A, not to replace it) Only use your albuterol inhaler as a rescue medication to be used if you can't catch your breath by resting or doing a relaxed purse lip breathing pattern.  - The less you use  it, the better it will work when you need it. - Ok to use the inhaler up to 2 puffs  every 4 hours if you must but call for appointment if use goes up over your usual need - Don't leave home without it !!  (think of it like the spare tire for your car)   Please schedule a follow up visit in 6 months but call sooner if needed

## 2022-12-14 NOTE — Assessment & Plan Note (Addendum)
Passive exposure to cigarettes growing up/MM - 07/30/2022  After extensive coaching inhaler device,  effectiveness =  75% (short ti) -Labs ordered 07/30/2022  :      alpha one AT phenotype MM/ Eos 0.2 / IgE   5 - 07/30/2022   Walked on RA  x  3  lap(s) =  approx 450  ft  @ mod fast pace, stopped due to end of study with lowest 02 sats 94% and mild chest tightness on 2nd/3rd lap   - PFTs  07/31/22 slt blunted exp loop in effort dep portion/ o/w completely wnl  - 12/14/2022  After extensive coaching inhaler device,  effectiveness =  75% from baseline  50% (short Ti)   Given she doesn't have copd and her main symptom is cough with abn early portion of f/v loop most 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  Total time for H and P, chart review, counseling, reviewing hfa  device(s) and generating customized AVS unique to this office visit / same day charting > 30 min for refractory respiratory  symptoms of uncertain etiology

## 2022-12-18 ENCOUNTER — Other Ambulatory Visit (HOSPITAL_COMMUNITY): Payer: Self-pay

## 2022-12-21 ENCOUNTER — Other Ambulatory Visit (HOSPITAL_COMMUNITY): Payer: Self-pay

## 2022-12-21 NOTE — Telephone Encounter (Signed)
Preferred medications in the same class as Symbicort are [place] (generic Advair Diskus), Lavada Mesi and Advair HFA. Per test claims these are the following co-pays at this time: [place] (generic Advair Diskus)-$45.00 Dulera-$95.00 Breo-$45.00 Advair HFA-$45.00  Please advise if patient has tried or has any contraindication to these alternatives.

## 2022-12-22 NOTE — Telephone Encounter (Signed)
Dulera 100 Take 2 puffs first thing in am and then another 2 puffs about 12 hours later.  ?  ?

## 2022-12-25 MED ORDER — MOMETASONE FURO-FORMOTEROL FUM 100-5 MCG/ACT IN AERO: 2.0000 | INHALATION_SPRAY | Freq: Two times a day (BID) | RESPIRATORY_TRACT | 0 refills | Status: DC

## 2023-01-04 DIAGNOSIS — R5383 Other fatigue: Secondary | ICD-10-CM | POA: Diagnosis not present

## 2023-01-04 DIAGNOSIS — Z6832 Body mass index (BMI) 32.0-32.9, adult: Secondary | ICD-10-CM | POA: Diagnosis not present

## 2023-01-04 DIAGNOSIS — Z299 Encounter for prophylactic measures, unspecified: Secondary | ICD-10-CM | POA: Diagnosis not present

## 2023-01-04 DIAGNOSIS — E7800 Pure hypercholesterolemia, unspecified: Secondary | ICD-10-CM | POA: Diagnosis not present

## 2023-01-04 DIAGNOSIS — L989 Disorder of the skin and subcutaneous tissue, unspecified: Secondary | ICD-10-CM | POA: Diagnosis not present

## 2023-01-04 DIAGNOSIS — T148XXA Other injury of unspecified body region, initial encounter: Secondary | ICD-10-CM | POA: Diagnosis not present

## 2023-01-04 DIAGNOSIS — Z0000 Encounter for general adult medical examination without abnormal findings: Secondary | ICD-10-CM | POA: Diagnosis not present

## 2023-01-04 DIAGNOSIS — R030 Elevated blood-pressure reading, without diagnosis of hypertension: Secondary | ICD-10-CM | POA: Diagnosis not present

## 2023-01-04 DIAGNOSIS — I10 Essential (primary) hypertension: Secondary | ICD-10-CM | POA: Diagnosis not present

## 2023-01-04 DIAGNOSIS — S60453A Superficial foreign body of left middle finger, initial encounter: Secondary | ICD-10-CM | POA: Diagnosis not present

## 2023-01-04 DIAGNOSIS — E669 Obesity, unspecified: Secondary | ICD-10-CM | POA: Diagnosis not present

## 2023-01-04 DIAGNOSIS — Z79899 Other long term (current) drug therapy: Secondary | ICD-10-CM | POA: Diagnosis not present

## 2023-01-05 ENCOUNTER — Other Ambulatory Visit: Payer: Self-pay

## 2023-01-05 ENCOUNTER — Encounter (HOSPITAL_COMMUNITY): Payer: Self-pay | Admitting: *Deleted

## 2023-01-05 ENCOUNTER — Emergency Department (HOSPITAL_COMMUNITY)
Admission: EM | Admit: 2023-01-05 | Discharge: 2023-01-05 | Disposition: A | Discharge status: Home / Self Care | Payer: No Typology Code available for payment source | Attending: Emergency Medicine | Admitting: Emergency Medicine

## 2023-01-05 DIAGNOSIS — L608 Other nail disorders: Secondary | ICD-10-CM

## 2023-01-05 DIAGNOSIS — S60452A Superficial foreign body of right middle finger, initial encounter: Secondary | ICD-10-CM | POA: Insufficient documentation

## 2023-01-05 DIAGNOSIS — W458XXA Other foreign body or object entering through skin, initial encounter: Secondary | ICD-10-CM | POA: Insufficient documentation

## 2023-01-05 DIAGNOSIS — Y93E5 Activity, floor mopping and cleaning: Secondary | ICD-10-CM | POA: Diagnosis not present

## 2023-01-05 DIAGNOSIS — Y92000 Kitchen of unspecified non-institutional (private) residence as  the place of occurrence of the external cause: Secondary | ICD-10-CM | POA: Diagnosis not present

## 2023-01-05 DIAGNOSIS — Z91040 Latex allergy status: Secondary | ICD-10-CM | POA: Insufficient documentation

## 2023-01-05 MED ORDER — LIDOCAINE HCL (PF) 1 % IJ SOLN
5.0000 mL | Freq: Once | INTRAMUSCULAR | Status: AC
  Administered 2023-01-05: 5 mL via INTRADERMAL
  Filled 2023-01-05: qty 5

## 2023-01-05 MED ORDER — CEPHALEXIN 500 MG PO CAPS: 500.0000 mg | ORAL_CAPSULE | Freq: Four times a day (QID) | ORAL | 0 refills | Status: AC

## 2023-01-05 NOTE — Discharge Instructions (Addendum)
Splinter has been removed here today.  You have also been prescribed an antibiotic, cephalexin.  Please take the full course of this antibiotic, even if you start feeling better. Antibiotics may sometimes cause diarrhea.  You may take ibuprofen 400mg  every 6 hours as needed for pain.  Please return to the ER if your finger still continues to be red and swollen, has pus drainage, you develop fever or chills.

## 2023-01-05 NOTE — ED Triage Notes (Signed)
Pt states she was cleaning yesterday and got a splinter in her right middle finger  Pt states she went to urgent care and they were unable to remove the splinter

## 2023-01-05 NOTE — ED Provider Notes (Signed)
[place] EMERGENCY DEPARTMENT AT [facility] Provider Note   CSN: [id number] Arrival date & time: 01/05/23  6295     History  Chief Complaint  Patient presents with   Foreign Body in Skin    Vanessa Mcconnell is a 62 y.o. female who presents with a splinter in her right third finger.  She states she was cleaning a kitchen backboard yesterday when her finger caught it and she got a wooden sliver in her finger.  She was evaluated at urgent care where they tried to remove the splinter without success.  She reports her finger is throbbing and swollen.  HPI     Home Medications Prior to Admission medications   Medication Sig Start Date End Date Taking? Authorizing Provider  cephALEXin (KEFLEX) 500 MG capsule Take 1 capsule (500 mg total) by mouth 4 (four) times daily for 7 days. 01/05/23 01/12/23 Yes Arabella Merles, PA-C  albuterol (PROVENTIL HFA;VENTOLIN HFA) 108 (90 Base) MCG/ACT inhaler Inhale into the lungs every 6 (six) hours as needed for wheezing or shortness of breath.    [provider]  alendronate (FOSAMAX) 70 MG tablet Once a week 12/14/22   Nyoka Cowden, MD  amLODipine (NORVASC) 5 MG tablet Take 1 tablet (5 mg total) by mouth daily. 09/08/21   Nahser, Deloris Ping, MD  Budeson-Glycopyrrol-Formoterol (BREZTRI AEROSPHERE) 160-9-4.8 MCG/ACT AERO Inhale 2 puffs into the lungs 2 (two) times daily. 07/30/22   Nyoka Cowden, MD  Budeson-Glycopyrrol-Formoterol (BREZTRI AEROSPHERE) 160-9-4.8 MCG/ACT AERO Inhale 2 puffs into the lungs in the morning and at bedtime. 12/14/22   Nyoka Cowden, MD  budesonide-formoterol (SYMBICORT) 80-4.5 MCG/ACT inhaler Take 2 puffs first thing in am and then another 2 puffs about 12 hours later. 12/14/22   Nyoka Cowden, MD  Cholecalciferol (VITAMIN D3) 125 MCG (5000 UT) TABS Take by mouth. Per patient taking with Calcium    [provider]  DULoxetine (CYMBALTA) 60 MG capsule Take 1 capsule (60 mg total) by mouth daily.  11/25/22   Sater, Pearletha Furl, MD  famotidine (PEPCID) 20 MG tablet One after supper 12/14/22   Nyoka Cowden, MD  fluticasone [facility]) 50 MCG/ACT nasal spray as needed.    [provider]  gabapentin (NEURONTIN) 300 MG capsule One po qAM, one po qNoon, one po qEvening and two po qhs 11/25/22   Sater, Pearletha Furl, MD  hydrochlorothiazide (MICROZIDE) 12.5 MG capsule Take 12.5 mg by mouth daily. 12/03/21   [provider]  levETIRAcetam (KEPPRA XR) 750 MG 24 hr tablet Take 2 tablets by mouth at bedtime, 9pm 12/04/22   Sater, Pearletha Furl, MD  meclizine (ANTIVERT) 25 MG tablet Take 25 mg by mouth 3 (three) times daily as needed. 10/10/20   [provider]  metoprolol succinate (TOPROL-XL) 25 MG 24 hr tablet Take 25 mg by mouth 2 (two) times daily. 06/14/17   [provider]  metoprolol tartrate (LOPRESSOR) 25 MG tablet Take 1 tablet (25 mg total) by mouth once for 1 dose. Take 90-120 minutes prior to scan. 08/06/22 08/06/22  Nahser, Deloris Ping, MD  mometasone-formoterol (DULERA) 100-5 MCG/ACT AERO Inhale 2 puffs into the lungs in the morning and at bedtime. 12/25/22   Nyoka Cowden, MD  ondansetron (ZOFRAN) 4 MG tablet Take 4 mg by mouth as needed. 10/22/20   [provider]  pantoprazole (PROTONIX) 40 MG tablet Take 1 tablet (40 mg total) by mouth daily. 30 minutes before breakfast 12/02/17   Lewie Loron  W, NP  potassium chloride (MICRO-K) 10 MEQ CR capsule Take 10 mEq by mouth daily. 12/03/21   [provider]  prazosin (MINIPRESS) 2 MG capsule Take 2 mg by mouth at bedtime and may repeat dose one time if needed. 10/04/20   [provider]  rosuvastatin (CRESTOR) 5 MG tablet Take 5 mg by mouth once a week. 07/29/22   [provider]  tiZANidine (ZANAFLEX) 4 MG tablet Take 1 tablet (4 mg total) by mouth 2 (two) times daily as needed for muscle spasms. 11/26/22   Sater, Pearletha Furl, MD  vitamin B-12 (CYANOCOBALAMIN) 100 MCG tablet Take 100 mcg by mouth  daily. Per patient taking a gummie    [provider]  vitamin C (ASCORBIC ACID) 500 MG tablet Take 500 mg by mouth daily.    [provider]  zinc gluconate 50 MG tablet Take 50 mg by mouth daily.    [provider]      Allergies    Anesthesia s-i-40 [propofol], Benadryl [diphenhydramine hcl], Erythromycin, Iohexol, Latex, Penicillins, and Tetracyclines & related    Review of Systems   Review of Systems  Constitutional:  Negative for chills and fever.  Skin:        Right third finger distal phalanx edema    Physical Exam Updated Vital Signs BP 137/81 (BP Location: Left Arm)   Pulse 67   Temp 97.9 F (36.6 C) (Oral)   Resp 16   Ht 5\' 2"  (1.575 m)   Wt 81.6 kg   SpO2 98%   BMI 32.92 kg/m  Physical Exam Vitals and nursing note reviewed.  Constitutional:      Appearance: Normal appearance.  HENT:     Head: Atraumatic.  Pulmonary:     Effort: Pulmonary effort is normal.  Skin:    Comments: Right third finger with splinter under the nail bed, difficult to fully visualize given overlying nail polish.  Edema of the right middle third finger distal phalanx.  Full range of motion of the right third finger, sensation intact  Neurological:     General: No focal deficit present.     Mental Status: She is alert.     Deep Tendon Reflexes: Reflexes normal.  Psychiatric:        Mood and Affect: Mood normal.        Behavior: Behavior normal.     ED Results / Procedures / Treatments   Labs (all labs ordered are listed, but only abnormal results are displayed) Labs Reviewed - No data to display  EKG None  Radiology No results found.  Procedures .Foreign Body Removal  Date/Time: 01/05/2023 9:25 AM  Performed by: Arabella Merles, PA-C Authorized by: Arabella Merles, PA-C  Consent: Verbal consent obtained. Risks and benefits: risks, benefits and alternatives were discussed Consent given by: patient Patient understanding: patient states  understanding of the procedure being performed Patient identity confirmed: verbally with patient Anesthesia: digital block  Anesthesia: Local Anesthetic: lidocaine 1% without epinephrine Comments: 0.5cm  splinter under the right 3rd finger nailbed removed using suture scissors and tweezers.      Medications Ordered in ED Medications  lidocaine (PF) (XYLOCAINE) 1 % injection 5 mL (has no administration in time range)    ED Course/ Medical Decision Making/ A&P                             Medical Decision Making Risk Prescription drug management.   62 y.o.  female presents to the ED for concern of splinter under her right third finger nailbed  Differential diagnosis includes but is not limited to splinter, infection of the nailbed  ED Course:  Patient had unsuccessful removal of the splinter at urgent care yesterday, they only used tweezers to try and remove.  Discussed this with Dr. Hyacinth Meeker, performed a removal by performing a digital nerve block and try to separate the nailbed from the underlying skin with suture scissors, then removing the splinter. Patient tolerated procedure well and discharged home on cephalexin which she has taken previously with no complications (confirmed in the chart and verbally with patient).    Impression: Splinter of the right middle finger nailbed  Disposition:  The patient was discharged home with instructions to take a course of cephalexin, which she has taken previously without complication. She was instructed to keep the area clean and she may apply antibiotic ointment over the tip of the nailbed if she wants to. Return precautions given.   Lab Tests: None indicated  Imaging Studies ordered: None indicated   Cardiac Monitoring: / EKG: Not indicated   Consultations Obtained: None indicated   Co morbidities that complicate the patient evaluation  None indicated  Social Determinants of Health:  Unknown               Final Clinical Impression(s) / ED Diagnoses Final diagnoses:  Splinter hemorrhage under nail    Rx / DC Orders ED Discharge Orders          Ordered    cephALEXin (KEFLEX) 500 MG capsule  4 times daily        01/05/23 0912              Arabella Merles, PA-C 01/05/23 0931    Eber Hong, MD 01/15/23 [phone]

## 2023-01-12 ENCOUNTER — Ambulatory Visit: Payer: No Typology Code available for payment source | Attending: Nurse Practitioner | Admitting: Nurse Practitioner

## 2023-01-12 ENCOUNTER — Encounter: Payer: Self-pay | Admitting: Nurse Practitioner

## 2023-01-12 VITALS — BP 128/86 | HR 64 | Ht 62.0 in | Wt 182.0 lb

## 2023-01-12 DIAGNOSIS — I10 Essential (primary) hypertension: Secondary | ICD-10-CM

## 2023-01-12 DIAGNOSIS — R600 Localized edema: Secondary | ICD-10-CM | POA: Diagnosis not present

## 2023-01-12 DIAGNOSIS — I341 Nonrheumatic mitral (valve) prolapse: Secondary | ICD-10-CM | POA: Diagnosis not present

## 2023-01-12 DIAGNOSIS — E785 Hyperlipidemia, unspecified: Secondary | ICD-10-CM | POA: Diagnosis not present

## 2023-01-12 NOTE — Patient Instructions (Signed)
Medication Instructions:  Your physician recommends that you continue on your current medications as directed. Please refer to the Current Medication list given to you today. *If you need a refill on your cardiac medications before your next appointment, please call your pharmacy*   Lab Work: None ordered    Testing/Procedures: None ordered   Follow-Up: At [facility], you and your health needs are our priority.  As part of our continuing mission to provide you with exceptional heart care, we have created designated Provider Care Teams.  These Care Teams include your primary Cardiologist (physician) and Advanced Practice Providers (APPs -  Physician Assistants and Nurse Practitioners) who all work together to provide you with the care you need, when you need it.  We recommend signing up for the patient portal called "MyChart".  Sign up information is provided on this After Visit Summary.  MyChart is used to connect with patients for Virtual Visits (Telemedicine).  Patients are able to view lab/test results, encounter notes, upcoming appointments, etc.  Non-urgent messages can be sent to your provider as well.   To learn more about what you can do with MyChart, go to [email or web address].    Your next appointment:   12 month(s)  Provider:   Kristeen Miss, MD or Eligha Bridegroom, NP  Other Instructions [employer] APP

## 2023-01-12 NOTE — Progress Notes (Signed)
Cardiology Office Note:  .   Date:  01/12/2023  ID:  Vanessa Mcconnell, DOB March 22, 1961, MRN [id number] PCP: Ignatius Specking, MD  [facility] Providers Cardiologist:  Kristeen Miss, MD    Patient Profile: .      PMH Hypertension Hyperlipidemia COPD Multiple sclerosis PTSD Nuclear stress test Low risk myoview, no evidence of ischemia or infarction 09/02/22 Mitral valve prolapse Echo 12/30/21 LVEF 60-65%, no rwma, mild LVH, G1DD, normal RV, mitral valve normal in structure, trivial MR Seizures Obesity Leg edema  Most recent cardiology clinic visit was 08/06/2022 with Dr. Elease Hashimoto at which time she reported DOE. He recommended coronary CTA however she was concerned about getting contrast. She underwent CT calcium score instead which revealed CAC score 609 (98th percentile).  She was scheduled for [facility] which was low risk, no evidence of ischemia or infarction.        History of Present Illness: .   Vanessa Mcconnell is a very pleasant 62 y.o. female who is here for follow-up of DOE. Reports she is feeling "pretty well." Pulmonology changed her inhaler, which she feels has improved symptoms. Went to the gym this morning for the first time in a month, had some shortness of breath with her work out but recovered well. Occasional left chest pain under left breast that does not worsen with exertion. Occasionally feels heart racing, feels like "it is going to jump out of my body." Generally sits up or takes deep breaths and it resolves. Is concerned about weight gain - lost 37 lbs on her own through healthy eating and exercise, admits she has not been as diligent with diet recently. Occasional LE edema for which she takes hydrochlorothiazide. She denies fatigue, palpitations, melena, presyncope, syncope, orthopnea, and PND.  ROS: see HPI       Studies Reviewed: .         Risk Assessment/Calculations:             Physical Exam:   VS:  BP 128/86   Pulse 64   Ht 5\' 2"   (1.575 m)   Wt 182 lb (82.6 kg)   SpO2 98%   BMI 33.29 kg/m    Wt Readings from Last 3 Encounters:  01/12/23 182 lb (82.6 kg)  01/05/23 180 lb (81.6 kg)  12/14/22 178 lb 3.2 oz (80.8 kg)    GEN: Well nourished, well developed in no acute distress NECK: No JVD; No carotid bruits CARDIAC: RRR, no murmurs, rubs, gallops RESPIRATORY:  Clear to auscultation without rales, wheezing or rhonchi  ABDOMEN: Soft, non-tender, non-distended EXTREMITIES:  No edema; No deformity     ASSESSMENT AND PLAN: .    DOE: Improved with recent change of inhaler as prescribed by pulmonology.  No shortness of breath or chest pain with exercise. Occasional bilateral LE edema for which she takes HCTZ.  Normal LVEF, mild LVH, G1 DD on echo 12/30/2021. Encouraged continued efforts toward weight loss, mostly plant based, low sodium diet, and 150 minutes moderate intensity exercise.   Hypertension: BP is well-controlled.  She has stable renal function on lab work completed 01/04/2023. No medication changes today.   CAD without angina: CAC 609 (98th percentile) with calcium noted in LAD, LCx, mild in RCA. She underwent lexiscan myoview for evaluation of DOE which was low risk, no evidence of ischemia or infarction on 09/02/22.  She has occasional pains under her left breast and occasional shortness of breath. Breathing has improved on Breztri inhaler.  No indication  for further ischemic evaluation.  Lengthy discussion about cholesterol goal as noted below. Emphasized secondary prevention with healthy diet and exercise.   Hyperlipidemia LDL goal < 70: Normal Lp(a), LDL 158 on 01/04/23. Has increased rosuvastatin to 5 mg daily from once weekly. Is returning for lab work in 3 months with PCP. Encouraged her to reach out to [place] for guidance on therapy to manage cholesterol if needed.        Dispo: 1 year with Dr. Elease Hashimoto or me  Signed, Eligha Bridegroom, NP-C

## 2023-02-23 ENCOUNTER — Other Ambulatory Visit: Payer: Self-pay | Admitting: Internal Medicine

## 2023-02-23 DIAGNOSIS — Z1231 Encounter for screening mammogram for malignant neoplasm of breast: Secondary | ICD-10-CM

## 2023-02-26 DIAGNOSIS — H6092 Unspecified otitis externa, left ear: Secondary | ICD-10-CM | POA: Diagnosis not present

## 2023-02-26 DIAGNOSIS — H6992 Unspecified Eustachian tube disorder, left ear: Secondary | ICD-10-CM | POA: Diagnosis not present

## 2023-02-26 DIAGNOSIS — R070 Pain in throat: Secondary | ICD-10-CM | POA: Diagnosis not present

## 2023-02-26 DIAGNOSIS — I10 Essential (primary) hypertension: Secondary | ICD-10-CM | POA: Diagnosis not present

## 2023-02-26 DIAGNOSIS — Z299 Encounter for prophylactic measures, unspecified: Secondary | ICD-10-CM | POA: Diagnosis not present

## 2023-03-06 ENCOUNTER — Other Ambulatory Visit: Payer: Self-pay | Admitting: Neurology

## 2023-03-08 NOTE — Telephone Encounter (Signed)
Last seen on 11/25/22 Follow up scheduled on 11/29/23

## 2023-03-23 ENCOUNTER — Inpatient Hospital Stay: Admission: RE | Admit: 2023-03-23 | Payer: No Typology Code available for payment source | Source: Ambulatory Visit

## 2023-03-30 ENCOUNTER — Ambulatory Visit
Admission: RE | Admit: 2023-03-30 | Discharge: 2023-03-30 | Disposition: A | Discharge status: Home / Self Care | Payer: No Typology Code available for payment source | Source: Ambulatory Visit | Attending: Internal Medicine | Admitting: Internal Medicine

## 2023-03-30 DIAGNOSIS — Z1231 Encounter for screening mammogram for malignant neoplasm of breast: Secondary | ICD-10-CM | POA: Diagnosis not present

## 2023-04-05 DIAGNOSIS — R110 Nausea: Secondary | ICD-10-CM | POA: Diagnosis not present

## 2023-04-05 DIAGNOSIS — E7800 Pure hypercholesterolemia, unspecified: Secondary | ICD-10-CM | POA: Diagnosis not present

## 2023-04-05 DIAGNOSIS — Z2821 Immunization not carried out because of patient refusal: Secondary | ICD-10-CM | POA: Diagnosis not present

## 2023-04-05 DIAGNOSIS — Z79899 Other long term (current) drug therapy: Secondary | ICD-10-CM | POA: Diagnosis not present

## 2023-04-05 DIAGNOSIS — Z299 Encounter for prophylactic measures, unspecified: Secondary | ICD-10-CM | POA: Diagnosis not present

## 2023-04-05 DIAGNOSIS — I10 Essential (primary) hypertension: Secondary | ICD-10-CM | POA: Diagnosis not present

## 2023-04-16 DIAGNOSIS — R2681 Unsteadiness on feet: Secondary | ICD-10-CM | POA: Diagnosis not present

## 2023-04-16 DIAGNOSIS — E669 Obesity, unspecified: Secondary | ICD-10-CM | POA: Diagnosis not present

## 2023-04-16 DIAGNOSIS — E785 Hyperlipidemia, unspecified: Secondary | ICD-10-CM | POA: Diagnosis not present

## 2023-04-16 DIAGNOSIS — N182 Chronic kidney disease, stage 2 (mild): Secondary | ICD-10-CM | POA: Diagnosis not present

## 2023-04-16 DIAGNOSIS — Z6831 Body mass index (BMI) 31.0-31.9, adult: Secondary | ICD-10-CM | POA: Diagnosis not present

## 2023-04-16 DIAGNOSIS — I129 Hypertensive chronic kidney disease with stage 1 through stage 4 chronic kidney disease, or unspecified chronic kidney disease: Secondary | ICD-10-CM | POA: Diagnosis not present

## 2023-04-16 DIAGNOSIS — J449 Chronic obstructive pulmonary disease, unspecified: Secondary | ICD-10-CM | POA: Diagnosis not present

## 2023-04-16 DIAGNOSIS — G4089 Other seizures: Secondary | ICD-10-CM | POA: Diagnosis not present

## 2023-04-16 DIAGNOSIS — Z008 Encounter for other general examination: Secondary | ICD-10-CM | POA: Diagnosis not present

## 2023-06-13 NOTE — Progress Notes (Unsigned)
Vanessa Mcconnell, female    DOB: 02/11/1961    MRN: [id number]   Brief patient profile:  62 yowf never smoker/MM  heavy passive smoke exposure with sinus and ear infections as child and much less exp since the age of 62 referred to pulmonary clinic in [place]  07/30/2022 by Dr Sherril Croon  for dx of  copd vs AB with freq flares between 2018-2019 and some better on anoro since then    History of Present Illness  07/30/2022  Pulmonary/ 1st office eval/ Vanessa Mcconnell / [place] [facility]  Chief Complaint  Patient presents with   Consult    Self referral for COPD asthma   Dyspnea:  does grocery shopping ok/ flat to MB x 300 and stops before landing assoc with chest tightness x ? 5 y f/b cards already Cough: clear mucus stuffy head and ear on cipro 1/17  Sleep: flat with one pillow SABA use: once a day up 3 x  02: none  Rec Plan A = Automatic = Always=    Breztri Take 2 puffs first thing in am and then another 2 puffs about 12 hours later.  Work on inhaler technique:  Plan B = Backup (to supplement plan A, not to replace it) Only use your albuterol inhaler as a rescue medication  Please schedule a follow up visit in 3 months but call sooner if needed  - PFTs at [facility] next available   Labs 07/30/2022  :    alpha one AT phenotype MM/ Eos 0.2 / IgE   5   12/14/2022  f/u ov/[place] office/Vanessa Mcconnell re: AB maint on Breztri   Chief Complaint  Patient presents with   Follow-up    Pt f/u states that she is feeling "weak/drowsy/dizzy", she reports that 6/2 she had a seizure in the early morning and when she went to take some ibuprofen she grabbed the wrong bottle and took 4- 75mg  diclofenac (husband's prescription)    Dyspnea:  eliptical x 15 min / rowing x 10 / bike 30 min  Cough: assoc nasal congestion  Sleeping: flat bed/ one pillow/ wheezing does not disturb her sleep SABA use: none  02: none   Rec Pantoprazole (protonix) 40 mg  Take  30-60 min before first meal of the day and ADD  Pepcid  (famotidine)  20 mg after supper   GERD diet reviewed, bed blocks rec  Plan A = Automatic = Always=    Breztri (symbicort 80) Take 2 puffs first thing in am and then another 2 puffs about 12 hours later.   Work on inhaler technique: >>>  Remember how golfers warm up by taking practice swings - do this with an empty inhaler  Plan B = Backup  (to supplement plan A, not to replace it) Only use your albuterol inhaler as a rescue medication   06/14/2023  f/u ov/[place] office/Vanessa Mcconnell re: GOLD 0/AB maint on ***  No chief complaint on file.   Dyspnea:  *** Cough: *** Sleeping: ***   resp cc  SABA use: *** 02: ***  Lung cancer screening: ***   No obvious day to day or daytime variability or assoc excess/ purulent sputum or mucus plugs or hemoptysis or cp or chest tightness, subjective wheeze or overt sinus or hb symptoms.    Also denies any obvious fluctuation of symptoms with weather or environmental changes or other aggravating or alleviating factors except as outlined above   No unusual exposure hx or h/o childhood pna/ asthma or knowledge of  premature birth.  Current Allergies, Complete Past Medical History, Past Surgical History, Family History, and Social History were reviewed in [employer] record.  ROS  The following are not active complaints unless bolded Hoarseness, sore throat, dysphagia, dental problems, itching, sneezing,  nasal congestion or discharge of excess mucus or purulent secretions, ear ache,   fever, chills, sweats, unintended wt loss or wt gain, classically pleuritic or exertional cp,  orthopnea pnd or arm/hand swelling  or leg swelling, presyncope, palpitations, abdominal pain, anorexia, nausea, vomiting, diarrhea  or change in bowel habits or change in bladder habits, change in stools or change in urine, dysuria, hematuria,  rash, arthralgias, visual complaints, headache, numbness, weakness or ataxia or problems with walking or coordination,   change in mood or  memory.        No outpatient medications have been marked as taking for the 06/14/23 encounter (Appointment) with Nyoka Cowden, MD.             Past Medical History:  Diagnosis Date   Arthritis    Asthma    Essential hypertension    Fibromyalgia    History of migraine    Hyperlipidemia    Malignant hyperthermia    Reportedly at age 46 with tonsillectomy   Pancreatitis    Reportedly single episode in childhood   Small intestinal bacterial overgrowth 12/2015   Positive breath test   Thoracic disc herniation       Objective:    Wts     06/14/2023      ***   12/14/22 178 lb 3.2 oz (80.8 kg)  11/25/22 178 lb (80.7 kg)  08/06/22 174 lb (78.9 kg)    Vital signs reviewed  06/14/2023  - Note at rest 02 sats  ***% on ***   General appearance:    ***          Assessment

## 2023-06-14 ENCOUNTER — Ambulatory Visit: Payer: No Typology Code available for payment source | Admitting: Internal Medicine

## 2023-06-14 ENCOUNTER — Encounter: Payer: Self-pay | Admitting: Internal Medicine

## 2023-06-15 ENCOUNTER — Ambulatory Visit (HOSPITAL_COMMUNITY)
Admission: RE | Admit: 2023-06-15 | Discharge: 2023-06-15 | Disposition: A | Discharge status: Home / Self Care | Payer: No Typology Code available for payment source | Source: Ambulatory Visit | Attending: Internal Medicine | Admitting: Internal Medicine

## 2023-06-15 ENCOUNTER — Ambulatory Visit: Payer: No Typology Code available for payment source | Admitting: Internal Medicine

## 2023-06-15 ENCOUNTER — Other Ambulatory Visit: Payer: Self-pay | Admitting: Internal Medicine

## 2023-06-15 ENCOUNTER — Encounter: Payer: Self-pay | Admitting: Internal Medicine

## 2023-06-15 VITALS — BP 142/84 | HR 64 | Ht 62.0 in | Wt 181.0 lb

## 2023-06-15 DIAGNOSIS — R079 Chest pain, unspecified: Secondary | ICD-10-CM | POA: Diagnosis not present

## 2023-06-15 DIAGNOSIS — R0609 Other forms of dyspnea: Secondary | ICD-10-CM

## 2023-06-15 DIAGNOSIS — J42 Unspecified chronic bronchitis: Secondary | ICD-10-CM | POA: Diagnosis not present

## 2023-06-15 DIAGNOSIS — I700 Atherosclerosis of aorta: Secondary | ICD-10-CM | POA: Diagnosis not present

## 2023-06-15 DIAGNOSIS — J4489 Other specified chronic obstructive pulmonary disease: Secondary | ICD-10-CM

## 2023-06-15 DIAGNOSIS — R0602 Shortness of breath: Secondary | ICD-10-CM | POA: Diagnosis not present

## 2023-06-15 MED ORDER — BUDESONIDE-FORMOTEROL FUMARATE 80-4.5 MCG/ACT IN AERO: INHALATION_SPRAY | RESPIRATORY_TRACT | 12 refills | Status: DC

## 2023-06-15 MED ORDER — METHYLPREDNISOLONE ACETATE 80 MG/ML IJ SUSP
120.0000 mg | Freq: Once | INTRAMUSCULAR | Status: AC
Start: 2023-06-15 — End: 2023-06-15
  Administered 2023-06-15: 120 mg via INTRAMUSCULAR

## 2023-06-15 NOTE — Assessment & Plan Note (Signed)
Passive exposure to cigarettes growing up/MM - 07/30/2022  After extensive coaching inhaler device,  effectiveness =  75% (short ti) -Labs ordered 07/30/2022  :      alpha one AT phenotype MM/ Eos 0.2 / IgE   5 - 07/30/2022   Walked on RA  x  3  lap(s) =  approx 450  ft  @ mod fast pace, stopped due to end of study with lowest 02 sats 94% and mild chest tightness on 2nd/3rd lap   - PFTs  07/31/22 slt blunted exp loop in effort dep portion/ o/w completely wnl  - 12/14/2022  After extensive coaching inhaler device,  effectiveness =    50% (short Ti)  - 06/15/2023  After extensive coaching inhaler device,  effectiveness =    60% > retry starting symbicort 80 2bid as still has very sensitive upper airway coughing on insp with saba demonstration and set off by perfumes by hx  Rule of 2s rewiewed.    Re saba with ex; Re SABA :  I spent extra time with pt today reviewing appropriate use of albuterol for prn use on exertion with the following points: 1) saba is for relief of sob that does not improve by walking a slower pace or resting but rather if the pt does not improve after trying this first. 2) If the pt is convinced, as many are, that saba helps recover from activity faster then it's easy to tell if this is the case by re-challenging : ie stop, take the inhaler, then p 5 minutes try the exact same activity (intensity of workload) that just caused the symptoms and see if they are substantially diminished or not after saba 3) if there is an activity that reproducibly causes the symptoms, try the saba 15 min before the activity on alternate days   If in fact the saba really does help, then fine to continue to use it prn but advised may need to look closer at the maintenance regimen (now symbicort 80)  being used to achieve better control of airways disease with exertion.

## 2023-06-15 NOTE — Assessment & Plan Note (Signed)
PFTs  07/31/22 slt blunted exp loop only in effort dep portion/ o/w completely wnl (no rx prior)  - 06/15/2023   Walked on RA  x  3   lap(s) =  approx 450  ft  @ mod pace, stopped due to end of study s cp or sob with lowest 02 sats 97%    Symptoms are markedly disproportionate to objective findings and not clear to what extent this is actually a pulmonary  problem but pt does appear to have difficult to sort out respiratory symptoms of unknown origin for which  DDX  = almost all start with A and  include Adherence, Ace Inhibitors, Acid Reflux, Active Sinus Disease, Alpha 1 Antitripsin deficiency, Anxiety masquerading as Airways dz,  ABPA,  Allergy(esp in young), Aspiration (esp in elderly), Adverse effects of meds,  Active smoking or Vaping, A bunch of PE's/clot burden (a few small clots can't cause this syndrome unless there is already severe underlying pulm or vascular dz with poor reserve),  Anemia or thyroid disorder, plus two Bs  = Bronchiectasis and Beta blocker use..and one C= CHF    Adherence is always the initial "prime suspect" and is a multilayered concern that requires a "trust but verify" approach in every patient - starting with knowing how to use medications, especially inhalers, correctly, keeping up with refills and understanding the fundamental difference between maintenance and prns vs those medications only taken for a very short course and then stopped and not refilled.  - see hfa > return with all meds in hand using a trust but verify approach to confirm accurate Medication  Reconciliation The principal here is that until we are certain that the  patients are doing what we've asked, it makes no sense to ask them to do more.  ? Acid (or non-acid) GERD > always difficult to exclude as up to 75% of pts in some series report no assoc GI/ Heartburn symptoms> rec continue max (24h)  acid suppression and diet restrictions/ reviewed     ? Allergy > depmedrol 120 mg IM / check eos  ?  A bunch  of PEs > check ddimer  ? CHF > check bnp   For now focus on rx for gerd since also has abn f/v loop in first portion and on fosamax with globus/ freq throat clearing accompanying doe along with now atypical cp (  likely though  mscp from fibromyalgia)   Each maintenance medication was reviewed in detail including emphasizing most importantly the difference between maintenance and prns and under what circumstances the prns are to be triggered using an action plan format where appropriate.  Total time for H and P, chart review, counseling, reviewing hfa  device(s) , directly observing portions of ambulatory 02 saturation study/ and generating customized AVS unique to this office visit / same day charting > 40 min for multiple  refractory respiratory /chest  symptoms of uncertain etiology

## 2023-06-15 NOTE — Progress Notes (Unsigned)
Vanessa Mcconnell, female    DOB: Feb 22, 1961    MRN: [id number]   Brief patient profile:  31 yowf never smoker/MM  heavy passive smoke exposure with sinus and ear infections as child and much less exp since the age of 20 referred to pulmonary clinic in [place]  07/30/2022 by Dr Sherril Croon  for dx of  copd vs AB with freq flares between 2018-2019 and some better on anoro since then    History of Present Illness  07/30/2022  Pulmonary/ 1st office eval/ Vanessa Mcconnell / [place] [facility]  Chief Complaint  Patient presents with   Consult    Self referral for COPD asthma   Dyspnea:  does grocery shopping ok/ flat to MB x 300 and stops before landing assoc with chest tightness x ? 5 y f/b cards already Cough: clear mucus stuffy head and ear on cipro 1/17  Sleep: flat with one pillow SABA use: once a day up 3 x  02: none  Rec Plan A = Automatic = Always=    Breztri Take 2 puffs first thing in am and then another 2 puffs about 12 hours later.  Work on inhaler technique:  Plan B = Backup (to supplement plan A, not to replace it) Only use your albuterol inhaler as a rescue medication  Please schedule a follow up visit in 3 months but call sooner if needed  - PFTs at [facility] next available   Labs 07/30/2022  :    alpha one AT phenotype MM/ Eos 0.2 / IgE   5   - PFTs  07/31/22 slt blunted exp loop only in effort dep portion/ o/w completely wnl (no rx prior)    12/14/2022  f/u ov/[place] office/Vanessa Mcconnell re: AB maint on Breztri   Chief Complaint  Patient presents with   Follow-up    Pt f/u states that she is feeling "weak/drowsy/dizzy", she reports that 6/2 she had a seizure in the early morning and when she went to take some ibuprofen she grabbed the wrong bottle and took 4- 75mg  diclofenac (husband's prescription)    Dyspnea:  eliptical x 15 min / rowing x 10 / bike 30 min  Cough: assoc nasal congestion  Sleeping: flat bed/ one pillow/ wheezing does not disturb her sleep SABA use: none  02: none    Rec Pantoprazole (protonix) 40 mg  Take  30-60 min before first meal of the day and ADD  Pepcid (famotidine)  20 mg after supper   GERD diet reviewed, bed blocks rec  Plan A = Automatic = Always=    Breztri (symbicort 80) Take 2 puffs first thing in am and then another 2 puffs about 12 hours later.   Work on inhaler technique: >>>  Remember how golfers warm up by taking practice swings - do this with an empty inhaler  Plan B = Backup  (to supplement plan A, not to replace it) Only use your albuterol inhaler as a rescue medication  06/15/2023  f/u ov/[place] office/Vanessa Mcconnell re:  AB maint on no maint rx (did not pick up rx) Chief Complaint  Patient presents with   Asthma   Shortness of Breath  Dyspnea:  limited by back more than breathing x sev weeks  Cough:  this am started up worse cough > clear mucus but no st/ fever / ha  Sleeping:  post cp some better supine -  SABA use: avg twice daily  02: none   No obvious day to day or daytime variability or assoc  excess/ purulent sputum or mucus plugs or hemoptysis or   chest tightness, subjective wheeze or overt sinus or hb symptoms.    Also denies any obvious fluctuation of symptoms with weather or environmental changes or other aggravating or alleviating factors except as outlined above   No unusual exposure hx or h/o childhood pna/ asthma or knowledge of premature birth.  Current Allergies, Complete Past Medical History, Past Surgical History, Family History, and Social History were reviewed in [employer] record.  ROS  The following are not active complaints unless bolded Hoarseness, sore throat, dysphagia= globus , dental problems, itching, sneezing,  nasal congestion or discharge of excess mucus or purulent secretions, ear ache,   fever, chills, sweats, unintended wt loss or wt gain, classically pleuritic or exertional cp,  orthopnea pnd or arm/hand swelling  or leg swelling, presyncope, palpitations, abdominal pain,  anorexia, nausea, vomiting, diarrhea  or change in bowel habits or change in bladder habits, change in stools or change in urine, dysuria, hematuria,  rash, arthralgias, visual complaints, headache, numbness, weakness or ataxia or problems with walking or coordination,  change in mood or  memory.        Current Meds  Medication Sig   albuterol (PROVENTIL HFA;VENTOLIN HFA) 108 (90 Base) MCG/ACT inhaler Inhale into the lungs every 6 (six) hours as needed for wheezing or shortness of breath.   alendronate (FOSAMAX) 70 MG tablet Once a week   amLODipine (NORVASC) 5 MG tablet Take 1 tablet (5 mg total) by mouth daily.   Budeson-Glycopyrrol-Formoterol (BREZTRI AEROSPHERE) 160-9-4.8 MCG/ACT AERO Inhale 2 puffs into the lungs in the morning and at bedtime.   budesonide-formoterol (SYMBICORT) 80-4.5 MCG/ACT inhaler Take 2 puffs first thing in am and then another 2 puffs about 12 hours later.   Cholecalciferol (VITAMIN D3) 125 MCG (5000 UT) TABS Take by mouth. Per patient taking with Calcium   DULoxetine (CYMBALTA) 60 MG capsule Take 1 capsule by mouth at bedtime   famotidine (PEPCID) 20 MG tablet One after supper   fluticasone (FLONASE) 50 MCG/ACT nasal spray as needed.   gabapentin (NEURONTIN) 300 MG capsule One po qAM, one po qNoon, one po qEvening and two po qhs   hydrochlorothiazide (MICROZIDE) 12.5 MG capsule Take 12.5 mg by mouth daily.   levETIRAcetam (KEPPRA XR) 750 MG 24 hr tablet Take 2 tablets by mouth at bedtime, 9pm   meclizine (ANTIVERT) 25 MG tablet Take 25 mg by mouth 3 (three) times daily as needed.   metoprolol succinate (TOPROL-XL) 25 MG 24 hr tablet Take 25 mg by mouth 2 (two) times daily.   ondansetron (ZOFRAN) 4 MG tablet Take 4 mg by mouth as needed.   pantoprazole (PROTONIX) 40 MG tablet Take 1 tablet (40 mg total) by mouth daily. 30 minutes before breakfast   potassium chloride (MICRO-K) 10 MEQ CR capsule Take 10 mEq by mouth daily.   prazosin (MINIPRESS) 2 MG capsule Take 2 mg  by mouth at bedtime and may repeat dose one time if needed.   rosuvastatin (CRESTOR) 5 MG tablet Take 5 mg by mouth once a week.   tiZANidine (ZANAFLEX) 4 MG tablet Take 1 tablet (4 mg total) by mouth 2 (two) times daily as needed for muscle spasms.   vitamin B-12 (CYANOCOBALAMIN) 100 MCG tablet Take 100 mcg by mouth daily. Per patient taking a gummie   vitamin C (ASCORBIC ACID) 500 MG tablet Take 500 mg by mouth daily.   zinc gluconate 50 MG tablet Take 50 mg  by mouth daily.             Past Medical History:  Diagnosis Date   Arthritis    Asthma    Essential hypertension    Fibromyalgia    History of migraine    Hyperlipidemia    Malignant hyperthermia    Reportedly at age 30 with tonsillectomy   Pancreatitis    Reportedly single episode in childhood   Small intestinal bacterial overgrowth 12/2015   Positive breath test   Thoracic disc herniation       Objective:    Wts   06/15/2023      181   12/14/22 178 lb 3.2 oz (80.8 kg)  11/25/22 178 lb (80.7 kg)  08/06/22 174 lb (78.9 kg)    Vital signs reviewed  06/15/2023  - Note at rest 02 sats  94% on RA   General appearance:    amb wf nad    HEENT : Oropharynx  clear          NECK :  without  apparent JVD/ palpable Nodes/TM    LUNGS: no acc muscle use,  Nl contour chest which is clear to A and P bilaterally without cough on insp or exp maneuvers/ mild superficial muscle tenderness ant and post R chest wall    CV:  RRR  no s3 or murmur or increase in P2, and no edema   ABD:  soft and nontender    MS:  Nl gait/ ext warm without deformities Or obvious joint restrictions  calf tenderness, cyanosis or clubbing    SKIN: warm and dry without lesions    NEURO:  alert, approp, nl sensorium with  no motor or cerebellar deficits apparent.      CXR PA and Lateral:   06/15/2023 :    I personally reviewed images and impression is as follows:   Mild kyphosis, nl lung fields      Labs ordered/ reviewed:      Chemistry       Component Value Date/Time   NA 135 08/06/2022 0827   K 4.9 08/06/2022 0827   CL 97 08/06/2022 0827   CO2 21 08/06/2022 0827   BUN 12 08/06/2022 0827   CREATININE 0.92 08/06/2022 0827   CREATININE 0.64 02/05/2015 0941      Component Value Date/Time   CALCIUM 9.8 08/06/2022 0827   ALKPHOS 125 08/26/2017 1453   AST 16 08/26/2017 1453   ALT 17 08/26/2017 1453   BILITOT 0.7 08/26/2017 1453        Lab Results  Component Value Date   WBC 5.8 06/15/2023   HGB 15.2 06/15/2023   HCT 45.8 06/15/2023   MCV 92 06/15/2023   PLT 398 06/15/2023     Lab Results  Component Value Date   DDIMER 0.33 06/15/2023      BNP  06/15/2023   =  22    Lab Results  Component Value Date   ESRSEDRATE 2 06/15/2023          Assessment

## 2023-06-15 NOTE — Patient Instructions (Addendum)
Plan A = Automatic = Always=    Symbicort 80 Take 2 puffs first thing in am and then another 2 puffs about 12 hours later.    Work on inhaler technique:  relax and gently blow all the way out then take a nice smooth full deep breath back in, triggering the inhaler at same time you start breathing in.  Hold breath in for at least  5 seconds if you can. Blow out symbicort  thru nose. Rinse and gargle with water when done.  If mouth or throat bother you at all,  try brushing teeth/gums/tongue with arm and hammer toothpaste/ make a slurry and gargle and spit out.    Plan B = Backup (to supplement plan A, not to replace it) Only use your albuterol inhaler as a rescue medication to be used if you can't catch your breath by resting or doing a relaxed purse lip breathing pattern.  - The less you use it, the better it will work when you need it. - Ok to use the inhaler up to 2 puffs  every 4 hours if you must but call for appointment if use goes up over your usual need - Don't leave home without it !!  (think of it like the spare tire for your car)   GERD (REFLUX)  is an extremely common cause of respiratory symptoms just like yours , many times with no obvious heartburn at all.    It can be treated with medication, but also with lifestyle changes including elevation of the head of your bed (ideally with 6 -8inch blocks under the headboard of your bed),  Smoking cessation, avoidance of late meals, excessive alcohol, and avoid fatty foods, chocolate, peppermint, colas, red wine, and acidic juices such as orange juice.  NO MINT OR MENTHOL PRODUCTS SO NO COUGH DROPS  USE SUGARLESS CANDY INSTEAD (Jolley ranchers or Stover's or Life Savers) or even ice chips will also do - the key is to swallow to prevent all throat clearing. NO OIL BASED VITAMINS - use powdered substitutes.  Avoid fish oil when coughing.   Depomedrol 120 mgIM   Please remember to go to the  x-ray department  @  [facility] for your  tests - we will call you with the results when they are available     Then return after lunch  to to the lab department   for your tests - we will call you with the results when they are available.  Please schedule a follow up visit in 3 months but call sooner if needed           Stop fosamax x next 6 weeks

## 2023-06-16 LAB — CBC WITH DIFFERENTIAL/PLATELET
Basophils Absolute: 0.1 10*3/uL (ref 0.0–0.2)
Basos: 1 %
EOS (ABSOLUTE): 0.2 10*3/uL (ref 0.0–0.4)
Eos: 3 %
Hematocrit: 45.8 % (ref 34.0–46.6)
Hemoglobin: 15.2 g/dL (ref 11.1–15.9)
Immature Grans (Abs): 0 10*3/uL (ref 0.0–0.1)
Immature Granulocytes: 1 %
Lymphocytes Absolute: 1.7 10*3/uL (ref 0.7–3.1)
Lymphs: 29 %
MCH: 30.6 pg (ref 26.6–33.0)
MCHC: 33.2 g/dL (ref 31.5–35.7)
MCV: 92 fL (ref 79–97)
Monocytes Absolute: 0.2 10*3/uL (ref 0.1–0.9)
Monocytes: 4 %
Neutrophils Absolute: 3.5 10*3/uL (ref 1.4–7.0)
Neutrophils: 62 %
Platelets: 398 10*3/uL (ref 150–450)
RBC: 4.96 x10E6/uL (ref 3.77–5.28)
RDW: 12.9 % (ref 11.7–15.4)
WBC: 5.8 10*3/uL (ref 3.4–10.8)

## 2023-06-16 LAB — BRAIN NATRIURETIC PEPTIDE: BNP: 21.8 pg/mL (ref 0.0–100.0)

## 2023-06-16 LAB — SEDIMENTATION RATE: Sed Rate: 2 mm/h (ref 0–40)

## 2023-06-16 LAB — D-DIMER, QUANTITATIVE: D-DIMER: 0.33 mg{FEU}/L (ref 0.00–0.49)

## 2023-06-16 NOTE — Telephone Encounter (Signed)
Symbicort alternative request received from pharmacy.  Request routed to Dr Sherene Sires to advise for change

## 2023-06-17 DIAGNOSIS — H16223 Keratoconjunctivitis sicca, not specified as Sjogren's, bilateral: Secondary | ICD-10-CM | POA: Diagnosis not present

## 2023-06-17 DIAGNOSIS — H524 Presbyopia: Secondary | ICD-10-CM | POA: Diagnosis not present

## 2023-06-17 DIAGNOSIS — G35 Multiple sclerosis: Secondary | ICD-10-CM | POA: Diagnosis not present

## 2023-06-21 ENCOUNTER — Encounter: Payer: No Typology Code available for payment source | Admitting: Orthopedic Surgery

## 2023-06-22 ENCOUNTER — Ambulatory Visit (INDEPENDENT_AMBULATORY_CARE_PROVIDER_SITE_OTHER): Payer: No Typology Code available for payment source | Admitting: Orthopedic Surgery

## 2023-06-22 ENCOUNTER — Other Ambulatory Visit (INDEPENDENT_AMBULATORY_CARE_PROVIDER_SITE_OTHER): Payer: Self-pay

## 2023-06-22 ENCOUNTER — Encounter: Payer: Self-pay | Admitting: Orthopedic Surgery

## 2023-06-22 VITALS — Ht 62.0 in | Wt 181.0 lb

## 2023-06-22 DIAGNOSIS — M546 Pain in thoracic spine: Secondary | ICD-10-CM

## 2023-06-22 NOTE — Progress Notes (Signed)
Office Visit Note   Patient: Vanessa Mcconnell           Date of Birth: 1960-09-08           MRN: [id number] Visit Date: 06/22/2023 Requested by: Ignatius Specking, MD [address] [place],  [place] [address] PCP: Ignatius Specking, MD   Assessment & Plan:   Encounter Diagnosis  Name Primary?   Pain in thoracic spine Yes    No orders of the defined types were placed in this encounter.  62 year old female with fibromyalgia thoracic back pain seems to be nonsurgical recommend physical therapy if no improvement recommend she see one of our back specialist   Subjective: Chief Complaint  Patient presents with   Back Pain    Mid back pain 2weeks now could hardly move for 1 1/2 week  painful to walk or lay down  pain moves to the neck and burns in the back     HPI: 62 year old female history of fibromyalgia and cervical thoracic and lumbar back pain with prior MRIs done the most recent in 2023 presents with acute onset of mid to lower thoracic back pain and some numbness on the left side which was severe a few weeks ago and has somewhat subsided.  She did take some over-the-counter medication ibuprofen and some tizanidine  She presents for evaluation and management without any myelopathic signs              ROS: Negative   Images personally read and my interpretation : Previous MRIs thoracic cervical and lumbar spine neither of the MRIs done show any surgical pathology at this time although they were done years ago I will list them in order   C4-5: Small central disc protrusion indents the thecal sac slightly but does not affect cord or show foraminal extension.   C5-6: Endplate osteophytes and bulging of the disc more prominent towards the left. Narrowing of the ventral subarachnoid space but no compressive effect upon the cord. No foraminal stenosis. No change.   C6-7: Normal interspace.   C7-T1: Normal interspace.   IMPRESSION: 1. No change since the study of 09/12/2020. 2. C4-5:  Small central disc protrusion indents the thecal sac slightly but does not affect the cord or show foraminal extension. 3. C5-6: Endplate osteophytes and bulging of the disc more prominent towards the left. Narrowing of the ventral subarachnoid space but no compressive effect upon the cord or foraminal extension.     Electronically Signed   By: Paulina Fusi M.D.   On: 06/30/2022 14:35  IMPRESSION: 1. No thoracic cord signal abnormality or evidence of demyelinating disease. 2. Multiple small noncompressive disc protrusions throughout the thoracic spine. No significant foraminal or canal stenosis at any level.     Electronically Signed   By: Duanne Guess D.O.   On: 11/14/2020 13:12   IMPRESSION:  Bone marrow lesions are present at L1 and L4 without abnormality  identified on the prior CT from 2008.  Differential includes  atypical hemangioma as well as other benign and malignant bone  lesions.  Metastatic disease is in the differential.  Follow-up CT  lumbar spine is suggested to evaluate for bony changes.    Mild lumbar degenerative changes without disc protrusion or spinal  stenosis.   Provider: Johny Blamer // August 16 2009    Visit Diagnoses:  1. Pain in thoracic spine      Follow-Up Instructions: No follow-ups on file.    Objective: Vital Signs: Ht  5\' 2"  (1.575 m)   Wt 181 lb (82.1 kg)   BMI 33.11 kg/m   Physical Exam Vitals and nursing note reviewed.  Constitutional:      Appearance: Normal appearance.  HENT:     Head: Normocephalic and atraumatic.  Eyes:     General: No scleral icterus.       Right eye: No discharge.        Left eye: No discharge.     Extraocular Movements: Extraocular movements intact.     Conjunctiva/sclera: Conjunctivae normal.     Pupils: Pupils are equal, round, and reactive to light.  Cardiovascular:     Rate and Rhythm: Normal rate.     Pulses: Normal pulses.  Skin:    General: Skin is warm and dry.     Capillary  Refill: Capillary refill takes less than 2 seconds.  Neurological:     General: No focal deficit present.     Mental Status: She is alert and oriented to person, place, and time.     Gait: Gait normal.  Psychiatric:        Mood and Affect: Mood normal.        Behavior: Behavior normal.        Thought Content: Thought content normal.        Judgment: Judgment normal.      Back Exam   Tenderness  The patient is experiencing tenderness in the thoracic (Thoracic tenderness on the right side probably between T7 and T12).  Range of Motion  Back extension: Pain from flexion back to extension.  Flexion:  normal        Specialty Comments:  No specialty comments available.  Imaging: No results found.   PMFS History: Patient Active Problem List   Diagnosis Date Noted   DOE (dyspnea on exertion) 06/15/2023   Asthmatic bronchitis , chronic (HCC) 07/30/2022   Seizures (HCC) 05/27/2022   Numbness 05/27/2022   Unsteady gait 05/27/2022   Transient alteration of awareness 05/27/2022   Cervical spine pain 08/11/2021   Carpal tunnel syndrome 08/11/2021   Chronic pain syndrome 08/11/2021   Falls 08/11/2021   Foot pain 08/11/2021   Low back pain 08/11/2021   Fibromyalgia 08/11/2021   White matter abnormality on MRI of brain 01/06/2021   Bilateral tinnitus 09/18/2020   Dizziness and giddiness 09/18/2020   Difficulty balancing 09/18/2020   Asthma 01/17/2018   COPD (chronic obstructive pulmonary disease) (HCC) 01/17/2018   Pancreatitis 01/17/2018   OSA (obstructive sleep apnea) 01/17/2018   Constipation 12/06/2017   GERD (gastroesophageal reflux disease) 12/02/2017   RUQ pain 12/02/2017   Heme positive stool 12/02/2017   Chest pain 08/26/2017   Essential hypertension 08/26/2017   Hyperlipidemia 08/26/2017   Hypokalemia 08/26/2017   Small intestinal bacterial overgrowth 12/26/2015   Dysphagia 12/26/2015   Nausea without vomiting 11/04/2015   Elevated blood pressure  05/08/2015   Bloating 02/05/2015   Nausea with vomiting 02/05/2015   Hematochezia 02/05/2015   Past Medical History:  Diagnosis Date   Arthritis    Asthma    Essential hypertension    Fibromyalgia    History of migraine    Hyperlipidemia    Malignant hyperthermia    Reportedly at age 51 with tonsillectomy   Pancreatitis    Reportedly single episode in childhood   Small intestinal bacterial overgrowth 12/2015   Positive breath test   Thoracic disc herniation     Family History  Problem Relation Age of Onset   Hypertension Mother  Diabetes Father    Heart attack Father        CABG X'3 & stent placement   Hypertension Father    Epilepsy Sister    Supraventricular tachycardia Sister    Other Sister        "hole in heart"   Heart attack Maternal Grandfather    Peripheral Artery Disease Sister    CAD Sister    Congestive Heart Failure Sister        mild diastolic heart failure   Heart disease Son    Heart disease Son    Colon cancer Neg Hx    Breast cancer Neg Hx     Past Surgical History:  Procedure Laterality Date   ABDOMINAL HYSTERECTOMY     BREAST BIOPSY Left 1990's   3 areas biopsied at different times, all with needle not excisional   COLONOSCOPY N/A 02/11/2015   RUE:[id number] diverticulosis in the sigmoid colon/moderate internal hemorrhoids   DEBRIDEMENT LEG     cellulitis debridement as a teen   ESOPHAGEAL DILATION  02/11/2015   Procedure: ESOPHAGEAL DILATION;  Surgeon: West Bali, MD;  Location: [facility];  Service: Endoscopy;;   ESOPHAGOGASTRODUODENOSCOPY N/A 02/11/2015   BJY:[id number] size HH/mild non-erosive gastritis   strep infection     causing skin infection, debridement buttocks, hip, legs   TONSILLECTOMY     ULNER NERVE SURGERY     Right Elbow   Social History   Occupational History   Not on file  Tobacco Use   Smoking status: Never   Smokeless tobacco: Never   Tobacco comments:    Never smoked  Vaping Use   Vaping status: Never  Used  Substance and Sexual Activity   Alcohol use: No    Alcohol/week: 0.0 standard drinks of alcohol   Drug use: No   Sexual activity: Not on file

## 2023-06-22 NOTE — Patient Instructions (Signed)
Physical therapy has been ordered for you at [facility]. They should call you to schedule, [phone] is the phone number to call, if you want to call to schedule.   

## 2023-06-23 NOTE — Addendum Note (Signed)
Addended byCaffie Damme on: 06/23/2023 11:55 AM   Modules accepted: Orders

## 2023-06-24 DIAGNOSIS — H52223 Regular astigmatism, bilateral: Secondary | ICD-10-CM | POA: Diagnosis not present

## 2023-06-24 DIAGNOSIS — H01004 Unspecified blepharitis left upper eyelid: Secondary | ICD-10-CM | POA: Diagnosis not present

## 2023-06-24 DIAGNOSIS — H01002 Unspecified blepharitis right lower eyelid: Secondary | ICD-10-CM | POA: Diagnosis not present

## 2023-06-24 DIAGNOSIS — H02834 Dermatochalasis of left upper eyelid: Secondary | ICD-10-CM | POA: Diagnosis not present

## 2023-06-24 DIAGNOSIS — H02831 Dermatochalasis of right upper eyelid: Secondary | ICD-10-CM | POA: Diagnosis not present

## 2023-06-24 DIAGNOSIS — H2513 Age-related nuclear cataract, bilateral: Secondary | ICD-10-CM | POA: Diagnosis not present

## 2023-06-24 DIAGNOSIS — H01001 Unspecified blepharitis right upper eyelid: Secondary | ICD-10-CM | POA: Diagnosis not present

## 2023-06-24 DIAGNOSIS — H01005 Unspecified blepharitis left lower eyelid: Secondary | ICD-10-CM | POA: Diagnosis not present

## 2023-06-30 ENCOUNTER — Ambulatory Visit (HOSPITAL_COMMUNITY): Payer: No Typology Code available for payment source

## 2023-07-05 NOTE — Progress Notes (Signed)
Spoke with pt and notified of results per Dr. Wert. Pt verbalized understanding and denied any questions. 

## 2023-07-08 DIAGNOSIS — Z299 Encounter for prophylactic measures, unspecified: Secondary | ICD-10-CM | POA: Diagnosis not present

## 2023-07-08 DIAGNOSIS — I10 Essential (primary) hypertension: Secondary | ICD-10-CM | POA: Diagnosis not present

## 2023-07-08 DIAGNOSIS — M1990 Unspecified osteoarthritis, unspecified site: Secondary | ICD-10-CM | POA: Diagnosis not present

## 2023-07-08 DIAGNOSIS — J069 Acute upper respiratory infection, unspecified: Secondary | ICD-10-CM | POA: Diagnosis not present

## 2023-07-15 ENCOUNTER — Ambulatory Visit (HOSPITAL_COMMUNITY): Payer: No Typology Code available for payment source

## 2023-07-20 DIAGNOSIS — Z299 Encounter for prophylactic measures, unspecified: Secondary | ICD-10-CM | POA: Diagnosis not present

## 2023-07-20 DIAGNOSIS — U071 COVID-19: Secondary | ICD-10-CM | POA: Diagnosis not present

## 2023-07-20 DIAGNOSIS — J029 Acute pharyngitis, unspecified: Secondary | ICD-10-CM | POA: Diagnosis not present

## 2023-07-20 DIAGNOSIS — J020 Streptococcal pharyngitis: Secondary | ICD-10-CM | POA: Diagnosis not present

## 2023-07-22 ENCOUNTER — Encounter: Payer: No Typology Code available for payment source | Admitting: Orthopedic Surgery

## 2023-08-12 ENCOUNTER — Ambulatory Visit: Payer: No Typology Code available for payment source | Admitting: Orthopedic Surgery

## 2023-08-12 VITALS — BP 119/82 | HR 60 | Ht 62.0 in | Wt 181.0 lb

## 2023-08-12 DIAGNOSIS — G8929 Other chronic pain: Secondary | ICD-10-CM | POA: Diagnosis not present

## 2023-08-12 DIAGNOSIS — M546 Pain in thoracic spine: Secondary | ICD-10-CM | POA: Diagnosis not present

## 2023-08-12 NOTE — Progress Notes (Signed)
Orthopedic Spine Surgery Office Note  Assessment: Patient is a 63 y.o. female with significant thoracic back pain that radiates into the immediately adjacent paraspinal muscles and to the mid axilla line.  She points to an area from T7 to roughly T11   Plan: -Explained that initially conservative treatment is tried as a significant number of patients may experience relief with these treatment modalities. Discussed that the conservative treatments include:  -activity modification  -physical therapy  -over the counter pain medications  -medrol dosepak  -steroid injections -Patient has done over 6 weeks of conservative treatment with her primary care doctor, Dr. Sherril Croon, so recommended MRI of the thoracic spine to evaluate further -She has an upcoming physical therapy appointment, recommended she continue to go to that appointment as another nonoperative treatment to try -Patient should return to office in 4 weeks, x-rays at next visit: None   Patient expressed understanding of the plan and all questions were answered to the patient's satisfaction.   ___________________________________________________________________________   History:  Patient is a 63 y.o. female who presents today for thoracic spine.  Patient has had about 3 months of significant thoracic back pain.  She feels it in the middle of her back.  It radiates into the paraspinal muscles and out laterally to about the mid axilla line.  She symptoms feels numbness in that area as well.  She describes the pain as burning and aching.  Sometimes, it can be severe and as bad as a 10 out of 10 pain.  She says that she notices it mostly if she is more active.  Sometimes the pain is so severe that she has to stop what she is doing.  When the pain is very severe, she says she feels physically sick.  She has tried several treatments with both her primary care doctor and Dr. Romeo Apple.  Has no pain radiating to her lower extremities.   Weakness:  Yes, feels her arms and legs are weaker.  No specific weakness noted Symptoms of imbalance: Yes, feels off balance at times.  No consistent unsteadiness or imbalance Paresthesias and numbness: Denies Bowel or bladder incontinence: Yes, sometimes has urinary leakage.  It has been present for over a year.  No new bowel or bladder symptoms. Saddle anesthesia: Denies  Treatments tried: Tylenol, Aleve, Medrol Dosepak  Review of systems: Denies fevers and chills, night sweats, unexplained weight loss, history of cancer, pain that wakes them at night  Past medical history:  HLD HTN Fibromyalgia GERD Osteoporosis Neuropathy MS Chronic pain  Allergies: Penicillin, Benadryl, iohexol, latex, tetracyclines  Past surgical history:  Tonsillectomy Hysterectomy Right cubital tunnel release  Social history: Denies use of nicotine product (smoking, vaping, patches, smokeless) Alcohol use: Denies Denies recreational drug use   Physical Exam:  BMI of 33.1  General: no acute distress, appears stated age Neurologic: alert, answering questions appropriately, following commands Respiratory: unlabored breathing on room air, symmetric chest rise Psychiatric: appropriate affect, normal cadence to speech   MSK (spine):  -Strength exam      Left  Right EHL    5/5  5/5 TA    5/5  5/5 GSC    5/5  5/5 Knee extension  5/5  5/5 Hip flexion   5/5  5/5  -Sensory exam    Sensation intact to light touch in L3-S1 nerve distributions of bilateral lower extremities  -Achilles DTR: 2/4 on the left, 2/4 on the right -Patellar tendon DTR: 3/4 on the left, 3/4 on the right  -Straight  leg raise: Negative bilaterally -Femoral nerve stretch test: Negative bilaterally -Clonus: no beats bilaterally -Negative Romberg  -Left hip exam: No pain through range of motion, negative Stinchfield, negative FABER -Right hip exam: No pain through range of motion, negative Stinchfield, negative  FABER  Imaging: XRs of the thoracic spine from 06/22/2023 was independently reviewed and interpreted, showing disc height loss from T7-T10 with small anterior osteophyte formation.  No fracture or dislocation seen.  Physiologic kyphosis.   Patient name: Vanessa Mcconnell Patient MRN: [id number] Date of visit: 08/12/23

## 2023-08-14 NOTE — Therapy (Signed)
 OUTPATIENT PHYSICAL THERAPY THORACOLUMBAR EVALUATION   Patient Name: Vanessa Mcconnell MRN: [id number] DOB:Jul 31, 1960, 63 y.o., female Today's Date: 08/17/2023  END OF SESSION:  PT End of Session - 08/17/23 0846     Visit Number 1    Number of Visits 8    Date for PT Re-Evaluation 09/14/23    Authorization Type [employer]    Authorization Time Period please check auth    PT Start Time [phone]    PT Stop Time 0925    PT Time Calculation (min) 38 min    Activity Tolerance Patient tolerated treatment well    Behavior During Therapy [facility] for tasks assessed/performed             Past Medical History:  Diagnosis Date   Arthritis    Asthma    Essential hypertension    Fibromyalgia    History of migraine    Hyperlipidemia    Malignant hyperthermia    Reportedly at age 15 with tonsillectomy   Pancreatitis    Reportedly single episode in childhood   Small intestinal bacterial overgrowth 12/2015   Positive breath test   Thoracic disc herniation    Past Surgical History:  Procedure Laterality Date   ABDOMINAL HYSTERECTOMY     BREAST BIOPSY Left 1990's   3 areas biopsied at different times, all with needle not excisional   COLONOSCOPY N/A 02/11/2015   DOQ:fnizmjuz diverticulosis in the sigmoid colon/moderate internal hemorrhoids   DEBRIDEMENT LEG     cellulitis debridement as a teen   ESOPHAGEAL DILATION  02/11/2015   Procedure: ESOPHAGEAL DILATION;  Surgeon: Margo LITTIE Haddock, MD;  Location: [facility];  Service: Endoscopy;;   ESOPHAGOGASTRODUODENOSCOPY N/A 02/11/2015   DOQ:fnizmjuz size HH/mild non-erosive gastritis   strep infection     causing skin infection, debridement buttocks, hip, legs   TONSILLECTOMY     ULNER NERVE SURGERY     Right Elbow   Patient Active Problem List   Diagnosis Date Noted   DOE (dyspnea on exertion) 06/15/2023   Asthmatic bronchitis , chronic (HCC) 07/30/2022   Seizures (HCC) 05/27/2022   Numbness 05/27/2022   Unsteady gait 05/27/2022    Transient alteration of awareness 05/27/2022   Cervical spine pain 08/11/2021   Carpal tunnel syndrome 08/11/2021   Chronic pain syndrome 08/11/2021   Falls 08/11/2021   Foot pain 08/11/2021   Low back pain 08/11/2021   Fibromyalgia 08/11/2021   White matter abnormality on MRI of brain 01/06/2021   Bilateral tinnitus 09/18/2020   Dizziness and giddiness 09/18/2020   Difficulty balancing 09/18/2020   Asthma 01/17/2018   COPD (chronic obstructive pulmonary disease) (HCC) 01/17/2018   Pancreatitis 01/17/2018   OSA (obstructive sleep apnea) 01/17/2018   Constipation 12/06/2017   GERD (gastroesophageal reflux disease) 12/02/2017   RUQ pain 12/02/2017   Heme positive stool 12/02/2017   Chest pain 08/26/2017   Essential hypertension 08/26/2017   Hyperlipidemia 08/26/2017   Hypokalemia 08/26/2017   Small intestinal bacterial overgrowth 12/26/2015   Dysphagia 12/26/2015   Nausea without vomiting 11/04/2015   Elevated blood pressure 05/08/2015   Bloating 02/05/2015   Nausea with vomiting 02/05/2015   Hematochezia 02/05/2015    PCP: Rosamond Leta NOVAK, MD  REFERRING PROVIDER: Margrette Taft BRAVO, MD  REFERRING DIAG: M54.6 (ICD-10-CM) - Pain in thoracic spine  Rationale for Evaluation and Treatment: Rehabilitation  THERAPY DIAG:  Pain in thoracic spine  ONSET DATE: couple of years  SUBJECTIVE:  SUBJECTIVE STATEMENT: Back pain for about 2 years; went to see Dr. Margrette; did x-ray, referred to PT and to Dr. Ozell Ada who is a neurosurgeon; saw him last week.  Scheduled MRI; will see him afterwards.  Has been bed bound x 3 weeks due to COVD and flu and strep and pneumonia so felt a little better but [facility] and so hurt last night; gets some N/T in legs; sometimes gets some pain up  into neck and down into lower back; feels her legs are weak  PERTINENT HISTORY:   osteoporosis Eye surgery scheduled for 2/21 and 3/7 cataracts Bad fall about 2 years ago with multiple fractures  PAIN:  Are you having pain? Yes: NPRS scale: 3/10 to 10/10 Pain location: mid back Pain description: throbbing, burning Aggravating factors: going to gym, standing up to cook or clean kitchen Relieving factors: ice, heat, ibuprofen   PRECAUTIONS: None   WEIGHT BEARING RESTRICTIONS: No  FALLS:  Has patient fallen in last 6 months? No  OCCUPATION: not working; on disability  PLOF: Independent  PATIENT GOALS: no pain; be able to do more; get legs stronger  NEXT MD VISIT: PRN after MRI  OBJECTIVE:  Note: Objective measures were completed at Evaluation unless otherwise noted.  DIAGNOSTIC FINDINGS:  Mid to upper thoracic back pain history of degenerative disc disease and disc protrusions in the thoracic spine   X-rays show multiple levels of degenerative arthritis with bone spurs and joint space narrowing   Thoracic spine arthritis   Also should note increased kyphosis  PATIENT SURVEYS:  Modified Oswestry 31/50 62%   COGNITION: Overall cognitive status: Within functional limits for tasks assessed     SENSATION: WFL  POSTURE: rounded shoulders, forward head, and increased thoracic kyphosis  PALPATION: General tenderness thoracic and lumbar paraspinals; upper traps  LUMBAR ROM:   AROM eval  Flexion Fingertips to just past knees*  Extension 50% available  Right lateral flexion To knee joint line  Left lateral flexion To 2 above knee joint line  Right rotation   Left rotation    (Blank rows = not tested)  LOWER EXTREMITY ROM:     Active  Right eval Left eval  Hip flexion    Hip extension    Hip abduction    Hip adduction    Hip internal rotation    Hip external rotation    Knee flexion    Knee extension    Ankle dorsiflexion    Ankle plantarflexion     Ankle inversion    Ankle eversion     (Blank rows = not tested)  LOWER EXTREMITY MMT:    MMT Right eval Left eval  Hip flexion 4- 4  Hip extension 3- 3-  Hip abduction    Hip adduction    Hip internal rotation    Hip external rotation    Knee flexion 3- 4  Knee extension 4 4+  Ankle dorsiflexion 4+ 5  Ankle plantarflexion    Ankle inversion    Ankle eversion     (Blank rows = not tested)  FUNCTIONAL TESTS:  5 times sit to stand: 16.51 sec using hands to assist  GAIT: Distance walked: 50 ft in gym Assistive device utilized: None Level of assistance: Complete Independence Comments: forward head; rounded shoulders  TREATMENT DATE: 08/17/23 physical therapy evaluation and HEP instruction  PATIENT EDUCATION:  Education details: Patient educated on exam findings, POC, scope of PT, HEP, and what to expect next visit. Person educated: Patient Education method: Explanation, Demonstration, and Handouts Education comprehension: verbalized understanding, returned demonstration, verbal cues required, and tactile cues required  HOME EXERCISE PROGRAM: Decompression exercises 1-5  ASSESSMENT:  CLINICAL IMPRESSION: Patient is a 63 y.o. female who was seen today for physical therapy evaluation and treatment for M54.6 (ICD-10-CM) - Pain in thoracic spine.  Patient demonstrates muscle weakness, reduced ROM, and fascial restrictions which are likely contributing to symptoms of pain and are negatively impacting patient ability to perform ADLs and functional mobility tasks. Patient will benefit from skilled physical therapy services to address these deficits to reduce pain and improve level of function with ADLs and functional mobility tasks.   OBJECTIVE IMPAIRMENTS: Abnormal gait, decreased activity tolerance, decreased mobility, decreased ROM, decreased  strength, hypomobility, increased fascial restrictions, impaired perceived functional ability, postural dysfunction, and pain.   ACTIVITY LIMITATIONS: carrying, lifting, bending, sitting, standing, squatting, sleeping, transfers, bathing, toileting, dressing, and caring for others  PARTICIPATION LIMITATIONS: meal prep, cleaning, laundry, driving, and shopping  REHAB POTENTIAL: Good  CLINICAL DECISION MAKING: Evolving/moderate complexity  EVALUATION COMPLEXITY: Moderate   GOALS: Goals reviewed with patient? No  SHORT TERM GOALS: Target date: 08/31/2023  patient will be independent with initial HEP  Baseline: Goal status: INITIAL  2.  Patient will report 50% improvement overall  Baseline:  Goal status: INITIAL   LONG TERM GOALS: Target date: 09/14/2023  Patient will be independent in self management strategies to improve quality of life and functional outcomes.  Baseline:  Goal status: INITIAL  2.  Patient will report 75% improvement overall  Baseline:  Goal status: INITIAL  3.  Patient will improve Modified Oswestry score by 5 points (26/50) to demonstrate improved perceived function  Baseline: 31/50 Goal status: INITIAL  4.  Patient will be able to stand x 30 min without back pain > 3/10 to cook a meal Baseline:  Goal status: INITIAL  5.   Patient will increase bilat leg MMT's to 4+ to 5/5 to allow navigation of steps without gait deviation or loss of balance   Baseline:  Goal status: INITIAL  PLAN:  PT FREQUENCY: 2x/week  PT DURATION: 4 weeks  PLANNED INTERVENTIONS: 97164- PT Re-evaluation, 97110-Therapeutic exercises, 97530- Therapeutic activity, 97112- Neuromuscular re-education, 97535- Self Care, [id number]- Manual therapy, [phone]- Gait training, [phone]- Orthotic Fit/training, [phone]- Canalith repositioning, [id number]- Aquatic Therapy, [phone]- Splinting, Patient/Family education, Balance training, Stair training, Taping, Dry Needling, Joint mobilization, Joint  manipulation, Spinal manipulation, Spinal mobilization, Scar mobilization, and DME instructions. SABRA  PLAN FOR NEXT SESSION: Review HEP and goals; decompression exercises; postural strengthening   9:24 AM, 08/17/23 Harvis Mabus Small Kielee Care MPT [facility] [place] [phone] Ph:[phone]   Managed Medicaid Authorization Request  Visit Dx Codes: M54.6, M54.50, R29.898  Functional Tool Score: Modified Oswestry 31/50 62%  For all possible CPT codes, reference the Planned Interventions line above.     Check all conditions that are expected to impact treatment: {Conditions expected to impact treatment:None of these apply   If treatment provided at initial evaluation, no treatment charged due to lack of authorization.

## 2023-08-17 ENCOUNTER — Other Ambulatory Visit: Payer: Self-pay

## 2023-08-17 ENCOUNTER — Ambulatory Visit (HOSPITAL_COMMUNITY): Payer: No Typology Code available for payment source | Attending: Orthopedic Surgery

## 2023-08-17 DIAGNOSIS — M546 Pain in thoracic spine: Secondary | ICD-10-CM | POA: Insufficient documentation

## 2023-08-17 DIAGNOSIS — M545 Low back pain, unspecified: Secondary | ICD-10-CM | POA: Insufficient documentation

## 2023-08-17 DIAGNOSIS — R29898 Other symptoms and signs involving the musculoskeletal system: Secondary | ICD-10-CM | POA: Insufficient documentation

## 2023-08-18 ENCOUNTER — Encounter (HOSPITAL_COMMUNITY): Payer: No Typology Code available for payment source

## 2023-08-24 ENCOUNTER — Ambulatory Visit (HOSPITAL_COMMUNITY)
Admission: RE | Admit: 2023-08-24 | Discharge: 2023-08-24 | Disposition: A | Discharge status: Home / Self Care | Payer: No Typology Code available for payment source | Source: Ambulatory Visit | Attending: Orthopedic Surgery | Admitting: Orthopedic Surgery

## 2023-08-24 DIAGNOSIS — M4804 Spinal stenosis, thoracic region: Secondary | ICD-10-CM | POA: Diagnosis not present

## 2023-08-24 DIAGNOSIS — M546 Pain in thoracic spine: Secondary | ICD-10-CM | POA: Insufficient documentation

## 2023-08-24 DIAGNOSIS — M47814 Spondylosis without myelopathy or radiculopathy, thoracic region: Secondary | ICD-10-CM | POA: Diagnosis not present

## 2023-08-24 DIAGNOSIS — G8929 Other chronic pain: Secondary | ICD-10-CM | POA: Insufficient documentation

## 2023-08-24 DIAGNOSIS — M5124 Other intervertebral disc displacement, thoracic region: Secondary | ICD-10-CM | POA: Diagnosis not present

## 2023-08-24 DIAGNOSIS — M5134 Other intervertebral disc degeneration, thoracic region: Secondary | ICD-10-CM | POA: Diagnosis not present

## 2023-08-25 ENCOUNTER — Encounter (HOSPITAL_COMMUNITY): Payer: No Typology Code available for payment source

## 2023-08-27 ENCOUNTER — Ambulatory Visit (HOSPITAL_COMMUNITY): Payer: No Typology Code available for payment source

## 2023-08-27 ENCOUNTER — Encounter (HOSPITAL_COMMUNITY): Payer: Self-pay

## 2023-08-27 DIAGNOSIS — M546 Pain in thoracic spine: Secondary | ICD-10-CM

## 2023-08-27 DIAGNOSIS — M545 Low back pain, unspecified: Secondary | ICD-10-CM

## 2023-08-27 DIAGNOSIS — R29898 Other symptoms and signs involving the musculoskeletal system: Secondary | ICD-10-CM

## 2023-08-27 NOTE — Therapy (Signed)
OUTPATIENT PHYSICAL THERAPY THORACOLUMBAR TREATMENT   Patient Name: Vanessa Mcconnell MRN: [id number] DOB:[birth year], 63 y.o., female Today's Date: 08/27/2023  END OF SESSION:  PT End of Session - 08/27/23 0841     Visit Number 2    Number of Visits 8    Date for PT Re-Evaluation 09/14/23    Authorization Type [employer]    PT Start Time [id number]    PT Stop Time 0925    PT Time Calculation (min) 43 min    Activity Tolerance Patient tolerated treatment well    Behavior During Therapy WFL for tasks assessed/performed             Past Medical History:  Diagnosis Date   Arthritis    Asthma    Essential hypertension    Fibromyalgia    History of migraine    Hyperlipidemia    Malignant hyperthermia    Reportedly at age 72 with tonsillectomy   Pancreatitis    Reportedly single episode in childhood   Small intestinal bacterial overgrowth 12/2015   Positive breath test   Thoracic disc herniation    Past Surgical History:  Procedure Laterality Date   ABDOMINAL HYSTERECTOMY     BREAST BIOPSY Left 1990's   3 areas biopsied at different times, all with needle not excisional   COLONOSCOPY N/A 02/11/2015   FAO:ZHYQMVHQ diverticulosis in the sigmoid colon/moderate internal hemorrhoids   DEBRIDEMENT LEG     cellulitis debridement as a teen   ESOPHAGEAL DILATION  02/11/2015   Procedure: ESOPHAGEAL DILATION;  Surgeon: West Bali, MD;  Location: [facility];  Service: Endoscopy;;   ESOPHAGOGASTRODUODENOSCOPY N/A 02/11/2015   ION:GEXBMWUX size HH/mild non-erosive gastritis   strep infection     causing skin infection, debridement buttocks, hip, legs   TONSILLECTOMY     ULNER NERVE SURGERY     Right Elbow   Patient Active Problem List   Diagnosis Date Noted   DOE (dyspnea on exertion) 06/15/2023   Asthmatic bronchitis , chronic (HCC) 07/30/2022   Seizures (HCC) 05/27/2022   Numbness 05/27/2022   Unsteady gait 05/27/2022   Transient alteration of awareness 05/27/2022    Cervical spine pain 08/11/2021   Carpal tunnel syndrome 08/11/2021   Chronic pain syndrome 08/11/2021   Falls 08/11/2021   Foot pain 08/11/2021   Low back pain 08/11/2021   Fibromyalgia 08/11/2021   White matter abnormality on MRI of brain 01/06/2021   Bilateral tinnitus 09/18/2020   Dizziness and giddiness 09/18/2020   Difficulty balancing 09/18/2020   Asthma 01/17/2018   COPD (chronic obstructive pulmonary disease) (HCC) 01/17/2018   Pancreatitis 01/17/2018   OSA (obstructive sleep apnea) 01/17/2018   Constipation 12/06/2017   GERD (gastroesophageal reflux disease) 12/02/2017   RUQ pain 12/02/2017   Heme positive stool 12/02/2017   Chest pain 08/26/2017   Essential hypertension 08/26/2017   Hyperlipidemia 08/26/2017   Hypokalemia 08/26/2017   Small intestinal bacterial overgrowth 12/26/2015   Dysphagia 12/26/2015   Nausea without vomiting 11/04/2015   Elevated blood pressure 05/08/2015   Bloating 02/05/2015   Nausea with vomiting 02/05/2015   Hematochezia 02/05/2015    PCP: Ignatius Specking, MD  REFERRING PROVIDER: Vickki Hearing, MD  REFERRING DIAG: M54.6 (ICD-10-CM) - Pain in thoracic spine  Rationale for Evaluation and Treatment: Rehabilitation  THERAPY DIAG:  Pain in thoracic spine  Low back pain, unspecified back pain laterality, unspecified chronicity, unspecified whether sciatica present  Other symptoms and signs involving the musculoskeletal system  ONSET DATE: couple  of years  SUBJECTIVE:                                                                                                                                                                                           SUBJECTIVE STATEMENT: 08/27/23:  Pt reports a seizure yesterday while sleeping, stated she spent a lot of time in bed.  Pain scale 5/10 thoracic and lumbar pain, tender to the touch and feels weak today.  Stated it feels really stiff and heavy.    Eval:  Back pain for about 2 years;  went to see Dr. Romeo Apple; did x-ray, referred to PT and to Dr. Willia Craze who is a neurosurgeon; saw him last week.  Scheduled MRI; will see him afterwards.  Has been bed bound x 3 weeks due to COVD and flu and strep and pneumonia so felt a little better but [facility] and so hurt last night; gets some N/T in legs; sometimes gets some pain up into neck and down into lower back; feels her legs are weak  PERTINENT HISTORY:   osteoporosis Eye surgery scheduled for 2/21 and 3/7 cataracts Bad fall about 2 years ago with multiple fractures  PAIN:  Are you having pain? Yes: NPRS scale: 3/10 to 10/10 Pain location: mid back Pain description: throbbing, burning Aggravating factors: going to gym, standing up to cook or clean kitchen Relieving factors: ice, heat, ibuprofen  PRECAUTIONS: None   WEIGHT BEARING RESTRICTIONS: No  FALLS:  Has patient fallen in last 6 months? No  OCCUPATION: not working; on disability  PLOF: Independent  PATIENT GOALS: no pain; be able to do more; get legs stronger  NEXT MD VISIT: PRN after MRI  OBJECTIVE:  Note: Objective measures were completed at Evaluation unless otherwise noted.  DIAGNOSTIC FINDINGS:  Mid to upper thoracic back pain history of degenerative disc disease and disc protrusions in the thoracic spine   X-rays show multiple levels of degenerative arthritis with bone spurs and joint space narrowing   Thoracic spine arthritis   Also should note increased kyphosis  PATIENT SURVEYS:  Modified Oswestry 31/50 62%   COGNITION: Overall cognitive status: Within functional limits for tasks assessed     SENSATION: WFL  POSTURE: rounded shoulders, forward head, and increased thoracic kyphosis  PALPATION: General tenderness thoracic and lumbar paraspinals; upper traps  LUMBAR ROM:   AROM eval  Flexion Fingertips to just past knees*  Extension 50% available  Right lateral flexion To knee joint line  Left lateral  flexion To 2" above knee joint line  Right rotation   Left rotation    (Blank rows =  not tested)  LOWER EXTREMITY ROM:     Active  Right eval Left eval  Hip flexion    Hip extension    Hip abduction    Hip adduction    Hip internal rotation    Hip external rotation    Knee flexion    Knee extension    Ankle dorsiflexion    Ankle plantarflexion    Ankle inversion    Ankle eversion     (Blank rows = not tested)  LOWER EXTREMITY MMT:    MMT Right eval Left eval  Hip flexion 4- 4  Hip extension 3- 3-  Hip abduction    Hip adduction    Hip internal rotation    Hip external rotation    Knee flexion 3- 4  Knee extension 4 4+  Ankle dorsiflexion 4+ 5  Ankle plantarflexion    Ankle inversion    Ankle eversion     (Blank rows = not tested)  FUNCTIONAL TESTS:  5 times sit to stand: 16.51 sec using hands to assist  GAIT: Distance walked: 50 ft in gym Assistive device utilized: None Level of assistance: Complete Independence Comments: forward head; rounded shoulders  TREATMENT DATE:  08/27/23:  Reviewed goals Educated importance of HEP compliance for maximal benefits Pt able to recall and demonstrate appropraite mechanics  Supine: Decompression 2-5 5x 5" Deep breathing with 1 hand stomach/chest TrA activation paired with exhalation x 1 min Bridge 10x 3" Decompression with theraband 1-4 5x each (monitored Rt shoulder with exercise, no reports of increased pain) Posterior pelvic tilt 10x 5" SKTC 2x 30" LTR 5x 10"  Sidelying: Clam with RTB 10x 5"  08/17/23 physical therapy evaluation and HEP instruction                                                                                                                                 PATIENT EDUCATION:  Education details: Patient educated on exam findings, POC, scope of PT, HEP, and what to expect next visit. Person educated: Patient Education method: Explanation, Demonstration, and Handouts Education  comprehension: verbalized understanding, returned demonstration, verbal cues required, and tactile cues required  HOME EXERCISE PROGRAM: Decompression exercises 1-5  Access Code: [email or web address] URL: [email or web address] Date: 08/27/2023 Prepared by: Becky Sax  Exercises - Supine Transversus Abdominis Bracing - Hands on Stomach  - 1-2 x daily - 7 x weekly - 2 sets - 10 reps - 5" hold - Supine Bridge  - 1-2 x daily - 7 x weekly - 2 sets - 10 reps - 5" hold - Hooklying Single Knee to Chest Stretch  - 1 x daily - 7 x weekly - 3 sets - 2 reps - 30" hold - Supine Lower Trunk Rotation  - 1-2 x daily - 7 x weekly - 2 sets - 5 reps - 10 hold  ASSESSMENT:  CLINICAL IMPRESSION: Reviewed goals and educated importance of HEP compliance for maximal benefits.  Pt able  to recall and demonstrate appropriate mechanics with current exercise program.  Session focus with core and proximal musculature strengthening.  Pt tolerated well to session with no reports of increased pain.  NMR to improve TrA activation paired with exhalation to reduce holding breath with demonstration, verbal and tactile cueing.  Added core/proximal strengthening exercises and stretches to address "stiffness" to HEP with printout given and verbalized understanding.  Pain reduced to 2/10 at EOS.  Eval:  Patient is a 63 y.o. female who was seen today for physical therapy evaluation and treatment for M54.6 (ICD-10-CM) - Pain in thoracic spine.  Patient demonstrates muscle weakness, reduced ROM, and fascial restrictions which are likely contributing to symptoms of pain and are negatively impacting patient ability to perform ADLs and functional mobility tasks. Patient will benefit from skilled physical therapy services to address these deficits to reduce pain and improve level of function with ADLs and functional mobility tasks.   OBJECTIVE IMPAIRMENTS: Abnormal gait, decreased activity tolerance, decreased mobility, decreased  ROM, decreased strength, hypomobility, increased fascial restrictions, impaired perceived functional ability, postural dysfunction, and pain.   ACTIVITY LIMITATIONS: carrying, lifting, bending, sitting, standing, squatting, sleeping, transfers, bathing, toileting, dressing, and caring for others  PARTICIPATION LIMITATIONS: meal prep, cleaning, laundry, driving, and shopping  REHAB POTENTIAL: Good  CLINICAL DECISION MAKING: Evolving/moderate complexity  EVALUATION COMPLEXITY: Moderate   GOALS: Goals reviewed with patient? No  SHORT TERM GOALS: Target date: 08/31/2023  patient will be independent with initial HEP  Baseline: Goal status: INITIAL  2.  Patient will report 50% improvement overall  Baseline:  Goal status: INITIAL   LONG TERM GOALS: Target date: 09/14/2023  Patient will be independent in self management strategies to improve quality of life and functional outcomes.  Baseline:  Goal status: INITIAL  2.  Patient will report 75% improvement overall  Baseline:  Goal status: INITIAL  3.  Patient will improve Modified Oswestry score by 5 points (26/50) to demonstrate improved perceived function  Baseline: 31/50 Goal status: INITIAL  4.  Patient will be able to stand x 30 min without back pain > 3/10 to cook a meal Baseline:  Goal status: INITIAL  5.   Patient will increase bilat leg MMT's to 4+ to 5/5 to allow navigation of steps without gait deviation or loss of balance   Baseline:  Goal status: INITIAL  PLAN:  PT FREQUENCY: 2x/week  PT DURATION: 4 weeks  PLANNED INTERVENTIONS: 97164- PT Re-evaluation, 97110-Therapeutic exercises, 97530- Therapeutic activity, 97112- Neuromuscular re-education, 97535- Self Care, 16109- Manual therapy, [phone]- Gait training, [phone]- Orthotic Fit/training, [phone]- Canalith repositioning, [id number]- Aquatic Therapy, [phone]- Splinting, Patient/Family education, Balance training, Stair training, Taping, Dry Needling, Joint  mobilization, Joint manipulation, Spinal manipulation, Spinal mobilization, Scar mobilization, and DME instructions. Marland Kitchen  PLAN FOR NEXT SESSION:  decompression exercises; postural strengthening  Becky Sax, LPTA/CLT; CBIS [phone]  2:21 PM, 08/27/23

## 2023-08-30 ENCOUNTER — Ambulatory Visit (INDEPENDENT_AMBULATORY_CARE_PROVIDER_SITE_OTHER): Payer: No Typology Code available for payment source | Admitting: Physical Therapy

## 2023-08-30 DIAGNOSIS — R29898 Other symptoms and signs involving the musculoskeletal system: Secondary | ICD-10-CM

## 2023-08-30 DIAGNOSIS — M545 Low back pain, unspecified: Secondary | ICD-10-CM

## 2023-08-30 DIAGNOSIS — M546 Pain in thoracic spine: Secondary | ICD-10-CM | POA: Diagnosis not present

## 2023-08-30 NOTE — H&P (Signed)
Surgical History & Physical  Patient Name: Vanessa Mcconnell  DOB: May 11, 1961  Surgery: Cataract extraction with intraocular lens implant phacoemulsification; Left Eye Surgeon: [name] MD Surgery Date: 09/03/2023 Pre-Op Date: 06/23/2024  HPI: A 9 Yr. old female patient 1.  The patient is present for Cataract Evaluation referred by Dr. Daphine Deutscher. Both eyes are affected. The patient describes hazy symptoms affecting their eyes/vision. Distance vision is worse than near. The complaint is associated with blurry vision. Patient has trouble seeing the fine print on her computer. The patient is especially having trouble with driving in the dark. This is negatively affecting the patient's quality of life and the patient is unable to function adequately in life with the current level of vision.HPI was performed by [name] .  Medical History: Dry Eyes Cataracts  Arthritis Heart Problem High Blood Pressure Lung Problems  Review of Systems Cardiovascular High Blood Pressure, heart problems Musculoskeletal arthritis pains, fibromyalgia Neurological Multiple Sclerosis Respiratory lung problems All recorded systems are negative except as noted above.  Social Unknown if ever smoked   Medication ondansetron HCl ,  alendronate ,  hydrochlorothiazide ,  gabapentin ,  amlodipine ,  meloxicam ,  metoprolol succinate ,  albuterol sulfate ,  azithromycin ,  prazosin ,  pantoprazole ,  potassium chloride ,  rosuvastatin ,  duloxetine ,  Advair HFA ,  levetiracetam   Sx/Procedures None  Drug Allergies  Penicillin, Tetracycline, Ct scan dye  History & Physical: Heent: cataracts NECK: supple without bruits LUNGS: lungs clear to auscultation CV: regular rate and rhythm Abdomen: soft and non-tender  Impression & Plan: Assessment: 1.  NUCLEAR SCLEROSIS AGE RELATED; Both Eyes (H25.13) 2.  ASTIGMATISM, REGULAR; Both Eyes (H52.223) 3.  BLEPHARITIS; Right Upper Lid, Right Lower Lid, Left  Upper Lid, Left Lower Lid (H01.001, H01.002,H01.004,H01.005) 4.  DERMATOCHALASIS, no surgery; Right Upper Lid, Left Upper Lid (H02.831, Z36.644)  Plan: 1.  Cataract accounts for the patient's decreased vision. This visual impairment is not correctable with a tolerable change in glasses or contact lenses. Cataract surgery with an implantation of a new lens should significantly improve the visual and functional status of the patient. Discussed all risks, benefits, alternatives, and potential complications. Discussed the procedures and recovery. Patient desires to have surgery. A-scan ordered and performed today for intra-ocular lens calculations. The surgery will be performed in order to improve vision for driving, reading, and for eye examinations. Recommend phacoemulsification with intra-ocular lens. Recommend Dextenza for post-operative pain and inflammation. Left Eye. Dilates well - shugarcaine by protocol.  2.  Mild.  3.  Blepharitis is present - recommend regular lid cleaning.  4.  Asymptomatic, recommend observation for now. Findings, prognosis and treatment options reviewed.

## 2023-08-30 NOTE — Therapy (Signed)
OUTPATIENT PHYSICAL THERAPY THORACOLUMBAR TREATMENT   Patient Name: Vanessa Mcconnell MRN: [id number] DOB:1960-12-28, 63 y.o., female Today's Date: 08/30/2023  END OF SESSION:  PT End of Session - 08/30/23 0724     Visit Number 3    Number of Visits 8    Date for PT Re-Evaluation 09/14/23    Authorization Type [employer]    PT Start Time [phone]    PT Stop Time 0800    PT Time Calculation (min) 38 min    Activity Tolerance Patient tolerated treatment well    Behavior During Therapy WFL for tasks assessed/performed             Past Medical History:  Diagnosis Date   Arthritis    Asthma    Essential hypertension    Fibromyalgia    History of migraine    Hyperlipidemia    Malignant hyperthermia    Reportedly at age 60 with tonsillectomy   Pancreatitis    Reportedly single episode in childhood   Small intestinal bacterial overgrowth 12/2015   Positive breath test   Thoracic disc herniation    Past Surgical History:  Procedure Laterality Date   ABDOMINAL HYSTERECTOMY     BREAST BIOPSY Left 1990's   3 areas biopsied at different times, all with needle not excisional   COLONOSCOPY N/A 02/11/2015   FAO:ZHYQMVHQ diverticulosis in the sigmoid colon/moderate internal hemorrhoids   DEBRIDEMENT LEG     cellulitis debridement as a teen   ESOPHAGEAL DILATION  02/11/2015   Procedure: ESOPHAGEAL DILATION;  Surgeon: West Bali, MD;  Location: [facility];  Service: Endoscopy;;   ESOPHAGOGASTRODUODENOSCOPY N/A 02/11/2015   ION:GEXBMWUX size HH/mild non-erosive gastritis   strep infection     causing skin infection, debridement buttocks, hip, legs   TONSILLECTOMY     ULNER NERVE SURGERY     Right Elbow   Patient Active Problem List   Diagnosis Date Noted   DOE (dyspnea on exertion) 06/15/2023   Asthmatic bronchitis , chronic (HCC) 07/30/2022   Seizures (HCC) 05/27/2022   Numbness 05/27/2022   Unsteady gait 05/27/2022   Transient alteration of awareness 05/27/2022    Cervical spine pain 08/11/2021   Carpal tunnel syndrome 08/11/2021   Chronic pain syndrome 08/11/2021   Falls 08/11/2021   Foot pain 08/11/2021   Low back pain 08/11/2021   Fibromyalgia 08/11/2021   White matter abnormality on MRI of brain 01/06/2021   Bilateral tinnitus 09/18/2020   Dizziness and giddiness 09/18/2020   Difficulty balancing 09/18/2020   Asthma 01/17/2018   COPD (chronic obstructive pulmonary disease) (HCC) 01/17/2018   Pancreatitis 01/17/2018   OSA (obstructive sleep apnea) 01/17/2018   Constipation 12/06/2017   GERD (gastroesophageal reflux disease) 12/02/2017   RUQ pain 12/02/2017   Heme positive stool 12/02/2017   Chest pain 08/26/2017   Essential hypertension 08/26/2017   Hyperlipidemia 08/26/2017   Hypokalemia 08/26/2017   Small intestinal bacterial overgrowth 12/26/2015   Dysphagia 12/26/2015   Nausea without vomiting 11/04/2015   Elevated blood pressure 05/08/2015   Bloating 02/05/2015   Nausea with vomiting 02/05/2015   Hematochezia 02/05/2015    PCP: Ignatius Specking, MD  REFERRING PROVIDER: Vickki Hearing, MD  REFERRING DIAG: M54.6 (ICD-10-CM) - Pain in thoracic spine  Rationale for Evaluation and Treatment: Rehabilitation  THERAPY DIAG:  Pain in thoracic spine  Low back pain, unspecified back pain laterality, unspecified chronicity, unspecified whether sciatica present  Other symptoms and signs involving the musculoskeletal system  ONSET DATE: couple  of years  SUBJECTIVE:                                                                                                                                                                                           SUBJECTIVE STATEMENT: 08/30/23:  Pt states she woke up this morning with some stiffness and pain. Currently 2/10 with earlier 6/10.  States she got up and started moving around making it better.  Doing the exercises at least 1X day sometimes 2X.  08/27/23:  Pt reports a seizure  yesterday while sleeping, stated she spent a lot of time in bed.  Pain scale 5/10 thoracic and lumbar pain, tender to the touch and feels weak today.  Stated it feels really stiff and heavy.    Eval:  Back pain for about 2 years; went to see Dr. Romeo Apple; did x-ray, referred to PT and to Dr. Willia Craze who is a neurosurgeon; saw him last week.  Scheduled MRI; will see him afterwards.  Has been bed bound x 3 weeks due to COVD and flu and strep and pneumonia so felt a little better but [facility] and so hurt last night; gets some N/T in legs; sometimes gets some pain up into neck and down into lower back; feels her legs are weak  PERTINENT HISTORY:  Seizures (pt can tell when they're coming on)  osteoporosis Eye surgery scheduled for 2/21 and 3/7 cataracts Bad fall about 2 years ago with multiple fractures  PAIN:  Are you having pain? Yes: NPRS scale: 3/10 to 10/10 Pain location: mid back Pain description: throbbing, burning Aggravating factors: going to gym, standing up to cook or clean kitchen Relieving factors: ice, heat, ibuprofen  PRECAUTIONS: None  H/O SEIZURES   WEIGHT BEARING RESTRICTIONS: No  FALLS:  Has patient fallen in last 6 months? No  OCCUPATION: not working; on disability  PLOF: Independent  PATIENT GOALS: no pain; be able to do more; get legs stronger  NEXT MD VISIT: PRN after MRI  OBJECTIVE:  Note: Objective measures were completed at Evaluation unless otherwise noted.  DIAGNOSTIC FINDINGS:  Mid to upper thoracic back pain history of degenerative disc disease and disc protrusions in the thoracic spine   X-rays show multiple levels of degenerative arthritis with bone spurs and joint space narrowing   Thoracic spine arthritis   Also should note increased kyphosis  PATIENT SURVEYS:  Modified Oswestry 31/50 62%   COGNITION: Overall cognitive status: Within functional limits for tasks assessed     SENSATION: WFL  POSTURE: rounded  shoulders, forward head, and increased thoracic kyphosis  PALPATION: General tenderness thoracic  and lumbar paraspinals; upper traps  LUMBAR ROM:   AROM eval  Flexion Fingertips to just past knees*  Extension 50% available  Right lateral flexion To knee joint line  Left lateral flexion To 2" above knee joint line  Right rotation   Left rotation    (Blank rows = not tested)   LOWER EXTREMITY MMT:    MMT Right eval Left eval  Hip flexion 4- 4  Hip extension 3- 3-  Hip abduction    Hip adduction    Hip internal rotation    Hip external rotation    Knee flexion 3- 4  Knee extension 4 4+  Ankle dorsiflexion 4+ 5  Ankle plantarflexion    Ankle inversion    Ankle eversion     (Blank rows = not tested)  FUNCTIONAL TESTS:  5 times sit to stand: 16.51 sec using hands to assist  GAIT: Distance walked: 50 ft in gym Assistive device utilized: None Level of assistance: Complete Independence Comments: forward head; rounded shoulders  TREATMENT DATE:  08/30/23: Postural education Seated:  Wbacks 10X5"  Shoulder rolls up back and down 10X Supine:  abdominal bracing 10X5"  Decompression 1-5 5X each  Bridge 10X  SKTC 2X30"  08/27/23:  Reviewed goals Educated importance of HEP compliance for maximal benefits Pt able to recall and demonstrate appropraite mechanics  Supine: Decompression 2-5 5x 5" Deep breathing with 1 hand stomach/chest TrA activation paired with exhalation x 1 min Bridge 10x 3" Decompression with theraband 1-4 5x each (monitored Rt shoulder with exercise, no reports of increased pain) Posterior pelvic tilt 10x 5" SKTC 2x 30" LTR 5x 10"  Sidelying: Clam with RTB 10x 5"  08/17/23 physical therapy evaluation and HEP instruction                                                                                                                                 PATIENT EDUCATION:  Education details: Patient educated on exam findings, POC, scope of PT, HEP,  and what to expect next visit. Person educated: Patient Education method: Explanation, Demonstration, and Handouts Education comprehension: verbalized understanding, returned demonstration, verbal cues required, and tactile cues required  HOME EXERCISE PROGRAM: Decompression exercises 1-5  Access Code: [email or web address] URL: [email or web address] Date: 08/27/2023 Prepared by: Becky Sax  Exercises - Supine Transversus Abdominis Bracing - Hands on Stomach  - 1-2 x daily - 7 x weekly - 2 sets - 10 reps - 5" hold - Supine Bridge  - 1-2 x daily - 7 x weekly - 2 sets - 10 reps - 5" hold - Hooklying Single Knee to Chest Stretch  - 1 x daily - 7 x weekly - 3 sets - 2 reps - 30" hold - Supine Lower Trunk Rotation  - 1-2 x daily - 7 x weekly - 2 sets - 5 reps - 10 hold  ASSESSMENT:  CLINICAL IMPRESSION: Continued with established decompression and core stabilization therex.  Educated on posture and maintaining lumbar with body mechanics.  Added seated scapular strengthening exercises with overall good form and no issues. Pt with adequate hold times and no symptoms with therex today.  Pt will continue to benefit from skilled therapy.  Eval:  Patient is a 63 y.o. female who was seen today for physical therapy evaluation and treatment for M54.6 (ICD-10-CM) - Pain in thoracic spine.  Patient demonstrates muscle weakness, reduced ROM, and fascial restrictions which are likely contributing to symptoms of pain and are negatively impacting patient ability to perform ADLs and functional mobility tasks. Patient will benefit from skilled physical therapy services to address these deficits to reduce pain and improve level of function with ADLs and functional mobility tasks.   OBJECTIVE IMPAIRMENTS: Abnormal gait, decreased activity tolerance, decreased mobility, decreased ROM, decreased strength, hypomobility, increased fascial restrictions, impaired perceived functional ability, postural  dysfunction, and pain.   ACTIVITY LIMITATIONS: carrying, lifting, bending, sitting, standing, squatting, sleeping, transfers, bathing, toileting, dressing, and caring for others  PARTICIPATION LIMITATIONS: meal prep, cleaning, laundry, driving, and shopping  REHAB POTENTIAL: Good  CLINICAL DECISION MAKING: Evolving/moderate complexity  EVALUATION COMPLEXITY: Moderate   GOALS: Goals reviewed with patient? No  SHORT TERM GOALS: Target date: 08/31/2023  patient will be independent with initial HEP  Baseline: Goal status: INITIAL  2.  Patient will report 50% improvement overall  Baseline:  Goal status: INITIAL   LONG TERM GOALS: Target date: 09/14/2023  Patient will be independent in self management strategies to improve quality of life and functional outcomes.  Baseline:  Goal status: INITIAL  2.  Patient will report 75% improvement overall  Baseline:  Goal status: INITIAL  3.  Patient will improve Modified Oswestry score by 5 points (26/50) to demonstrate improved perceived function  Baseline: 31/50 Goal status: INITIAL  4.  Patient will be able to stand x 30 min without back pain > 3/10 to cook a meal Baseline:  Goal status: INITIAL  5.   Patient will increase bilat leg MMT's to 4+ to 5/5 to allow navigation of steps without gait deviation or loss of balance   Baseline:  Goal status: INITIAL  PLAN:  PT FREQUENCY: 2x/week  PT DURATION: 4 weeks  PLANNED INTERVENTIONS: 97164- PT Re-evaluation, 97110-Therapeutic exercises, 97530- Therapeutic activity, 97112- Neuromuscular re-education, 97535- Self Care, 09811- Manual therapy, [phone]- Gait training, [phone]- Orthotic Fit/training, [phone]- Canalith repositioning, [id number]- Aquatic Therapy, [phone]- Splinting, Patient/Family education, Balance training, Stair training, Taping, Dry Needling, Joint mobilization, Joint manipulation, Spinal manipulation, Spinal mobilization, Scar mobilization, and DME instructions. Marland Kitchen  PLAN  FOR NEXT SESSION:  continue with decompression exercises; postural strengthening  Lurena Nida, PTA/CLT [facility] Ph: [phone]  7:25 AM, 08/30/23

## 2023-09-01 ENCOUNTER — Encounter (HOSPITAL_COMMUNITY): Payer: No Typology Code available for payment source

## 2023-09-01 ENCOUNTER — Encounter (HOSPITAL_COMMUNITY)
Admission: RE | Admit: 2023-09-01 | Discharge: 2023-09-01 | Disposition: A | Discharge status: Home / Self Care | Payer: No Typology Code available for payment source | Source: Ambulatory Visit | Attending: Ophthalmology | Admitting: Ophthalmology

## 2023-09-01 ENCOUNTER — Encounter (HOSPITAL_COMMUNITY): Payer: Self-pay

## 2023-09-01 HISTORY — DX: Multiple sclerosis: G35

## 2023-09-01 HISTORY — DX: Multiple sclerosis, unspecified: G35.D

## 2023-09-01 HISTORY — DX: Pneumonia, unspecified organism: J18.9

## 2023-09-01 HISTORY — DX: Nonrheumatic mitral (valve) prolapse: I34.1

## 2023-09-01 HISTORY — DX: Age-related osteoporosis without current pathological fracture: M81.0

## 2023-09-12 NOTE — Progress Notes (Deleted)
 Vanessa Mcconnell, female    DOB: October 05, 1960    MRN: [id number]   Brief patient profile:  63 yowf never smoker/MM  heavy passive smoke exposure with sinus and ear infections as child and much less exp since the age of 37 referred to pulmonary clinic in [place]  63/18/2024 by Dr Sherril Croon  for dx of  copd vs AB with freq flares between 2018-2019 and some better on anoro since then    History of Present Illness  07/30/2022  Pulmonary/ 1st office eval/ Vanessa Mcconnell / [place] [facility]  Chief Complaint  Patient presents with   Consult    Self referral for COPD asthma   Dyspnea:  does grocery shopping ok/ flat to MB x 300 and stops before landing assoc with chest tightness x ? 5 y f/b cards already Cough: clear mucus stuffy head and ear on cipro 1/17  Sleep: flat with one pillow SABA use: once a day up 3 x  02: none  Rec Plan A = Automatic = Always=    Breztri Take 2 puffs first thing in am and then another 2 puffs about 12 hours later.  Work on inhaler technique:  Plan B = Backup (to supplement plan A, not to replace it) Only use your albuterol inhaler as a rescue medication  Please schedule a follow up visit in 3 months but call sooner if needed  - PFTs at [facility] next available   Labs 07/30/2022  :    alpha one AT phenotype MM/ Eos 0.2 / IgE   5   - PFTs  07/31/22 slt blunted exp loop only in effort dep portion/ o/w completely wnl (no rx prior)    12/14/2022  f/u ov/[place] office/Vanessa Mcconnell re: AB maint on Breztri   Chief Complaint  Patient presents with   Follow-up    Pt f/u states that she is feeling "weak/drowsy/dizzy", she reports that 6/2 she had a seizure in the early morning and when she went to take some ibuprofen she grabbed the wrong bottle and took 4- 75mg  diclofenac (husband's prescription)    Dyspnea:  eliptical x 15 min / rowing x 10 / bike 30 min  Cough: assoc nasal congestion  Sleeping: flat bed/ one pillow/ wheezing does not disturb her sleep SABA use: none  02: none    Rec Pantoprazole (protonix) 40 mg  Take  30-60 min before first meal of the day and ADD  Pepcid (famotidine)  20 mg after supper   GERD diet reviewed, bed blocks rec  Plan A = Automatic = Always=    Breztri (symbicort 80) Take 2 puffs first thing in am and then another 2 puffs about 12 hours later.   Work on inhaler technique: >>>  Remember how golfers warm up by taking practice swings - do this with an empty inhaler  Plan B = Backup  (to supplement plan A, not to replace it) Only use your albuterol inhaler as a rescue medication  06/15/2023  f/u ov/[place] office/Vanessa Mcconnell re:  AB maint on no maint rx (did not pick up rx) Chief Complaint  Patient presents with   Asthma   Shortness of Breath  Dyspnea:  limited by back more than breathing x sev weeks  Cough:  this am started up worse cough > clear mucus but no st/ fever / ha  Sleeping:  post cp some better supine -  SABA use: avg twice daily  02: none   Rec   09/14/2023  f/u ov/[place] office/Vanessa Mcconnell re: ***  maint on ***  No chief complaint on file.   Dyspnea:  *** Cough: *** Sleeping: ***   resp cc  SABA use: *** 02: ***  Lung cancer screening: ***   No obvious day to day or daytime variability or assoc excess/ purulent sputum or mucus plugs or hemoptysis or cp or chest tightness, subjective wheeze or overt sinus or hb symptoms.    Also denies any obvious fluctuation of symptoms with weather or environmental changes or other aggravating or alleviating factors except as outlined above   No unusual exposure hx or h/o childhood pna/ asthma or knowledge of premature birth.  Current Allergies, Complete Past Medical History, Past Surgical History, Family History, and Social History were reviewed in [employer] record.  ROS  The following are not active complaints unless bolded Hoarseness, sore throat, dysphagia, dental problems, itching, sneezing,  nasal congestion or discharge of excess mucus or purulent  secretions, ear ache,   fever, chills, sweats, unintended wt loss or wt gain, classically pleuritic or exertional cp,  orthopnea pnd or arm/hand swelling  or leg swelling, presyncope, palpitations, abdominal pain, anorexia, nausea, vomiting, diarrhea  or change in bowel habits or change in bladder habits, change in stools or change in urine, dysuria, hematuria,  rash, arthralgias, visual complaints, headache, numbness, weakness or ataxia or problems with walking or coordination,  change in mood or  memory.        No outpatient medications have been marked as taking for the 09/14/23 encounter (Appointment) with Nyoka Cowden, MD.        Past Medical History:  Diagnosis Date   Arthritis    Asthma    Essential hypertension    Fibromyalgia    History of migraine    Hyperlipidemia    Malignant hyperthermia    Reportedly at age 63 with tonsillectomy   Pancreatitis    Reportedly single episode in childhood   Small intestinal bacterial overgrowth 12/2015   Positive breath test   Thoracic disc herniation       Objective:    Wts  09/14/2023          ***   06/15/2023      181   12/14/22 178 lb 3.2 oz (80.8 kg)  11/25/22 178 lb (80.7 kg)  08/06/22 174 lb (78.9 kg)     Vital signs reviewed  09/14/2023  - Note at rest 02 sats  ***% on ***   General appearance:    ***   kyphoscolitoic chest ***      Chemistry          Assessment

## 2023-09-13 ENCOUNTER — Encounter (HOSPITAL_COMMUNITY): Payer: No Typology Code available for payment source

## 2023-09-13 DIAGNOSIS — H2512 Age-related nuclear cataract, left eye: Secondary | ICD-10-CM | POA: Diagnosis not present

## 2023-09-14 ENCOUNTER — Encounter (HOSPITAL_COMMUNITY)
Admission: RE | Admit: 2023-09-14 | Discharge: 2023-09-14 | Disposition: A | Discharge status: Home / Self Care | Payer: No Typology Code available for payment source | Source: Ambulatory Visit | Attending: Ophthalmology | Admitting: Ophthalmology

## 2023-09-14 ENCOUNTER — Ambulatory Visit: Payer: No Typology Code available for payment source | Admitting: Internal Medicine

## 2023-09-14 ENCOUNTER — Telehealth: Payer: Self-pay | Admitting: Internal Medicine
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
## 2023-09-16 ENCOUNTER — Ambulatory Visit: Payer: No Typology Code available for payment source | Admitting: Orthopedic Surgery

## 2023-09-16 DIAGNOSIS — M5414 Radiculopathy, thoracic region: Secondary | ICD-10-CM | POA: Diagnosis not present

## 2023-09-16 NOTE — Progress Notes (Signed)
 Orthopedic Spine Surgery Office Note   Assessment: Patient is a 63 y.o. female with significant thoracic back pain that radiates into the immediately adjacent paraspinal muscles and to the mid axilla line.  She points to an area from T7 to roughly T11.  Has a T7/8 disc herniation     Plan: -At this point, she has tried multiple conservative treatments, so I discussed diagnostic/therapeutic injection with her.  She was interested in this treatment.  Referral provided to her today. -Patient has tried: PT, Tylenol, Aleve, Medrol Dosepak -Patient should return to office in 6 weeks, x-rays at next visit: none     Patient expressed understanding of the plan and all questions were answered to the patient's satisfaction.    ___________________________________________________________________________     History:   Patient is a 63 y.o. female who presents today for follow-up on her thoracic spine.  Patient has now had about 4 months of thoracic back pain that she feels just inferior to the scapula.  The pain radiates into the immediately adjacent paraspinal muscles to about the mid axilla line bilaterally.  The pain can be severe and get to a 10 out of 10 at its worst.  She does not have any pain rating into her lower extremities.  She has not noticed any changes in her symptoms since she was last seen in the office.   Treatments tried: PT, Tylenol, Aleve, Medrol Dosepak    Physical Exam:   General: no acute distress, appears stated age Neurologic: alert, answering questions appropriately, following commands Respiratory: unlabored breathing on room air, symmetric chest rise Psychiatric: appropriate affect, normal cadence to speech     MSK (spine):   -Strength exam                                                   Left                  Right EHL                              5/5                  5/5 TA                                 5/5                  5/5 GSC                              5/5                  5/5 Knee extension            5/5                  5/5 Hip flexion                    5/5                  5/5   -Sensory exam  Sensation intact to light touch in L3-S1 nerve distributions of bilateral lower extremities  Imaging: XRs of the thoracic spine from 06/22/2023 was independently reviewed and interpreted, showing disc height loss from T7-T10 with small anterior osteophyte formation.  No fracture or dislocation seen.  Physiologic kyphosis.  MRI of the thoracic spine from 08/24/2023 was independently reviewed and interpreted, showing right paracentral disc herniation at T7/8.  CSF seen surrounding the cord.  No T2 cord signal change.  Small disc bulge at T8/9.     Patient name: Vanessa Mcconnell Patient MRN: [id number] Date of visit: 09/16/23

## 2023-09-17 ENCOUNTER — Ambulatory Visit (HOSPITAL_COMMUNITY)
Admission: RE | Admit: 2023-09-17 | Discharge: 2023-09-17 | Disposition: A | Discharge status: Home / Self Care | Payer: No Typology Code available for payment source | Attending: Ophthalmology | Admitting: Ophthalmology

## 2023-09-17 ENCOUNTER — Ambulatory Visit (HOSPITAL_COMMUNITY): Admitting: Certified Registered Nurse Anesthetist

## 2023-09-17 ENCOUNTER — Encounter (HOSPITAL_COMMUNITY): Payer: Self-pay | Admitting: Ophthalmology

## 2023-09-17 ENCOUNTER — Encounter (HOSPITAL_COMMUNITY)
Admission: RE | Disposition: A | Discharge status: Home / Self Care | Payer: Self-pay | Source: Home / Self Care | Attending: Ophthalmology

## 2023-09-17 DIAGNOSIS — H02831 Dermatochalasis of right upper eyelid: Secondary | ICD-10-CM | POA: Diagnosis not present

## 2023-09-17 DIAGNOSIS — H2512 Age-related nuclear cataract, left eye: Secondary | ICD-10-CM | POA: Insufficient documentation

## 2023-09-17 DIAGNOSIS — K219 Gastro-esophageal reflux disease without esophagitis: Secondary | ICD-10-CM | POA: Insufficient documentation

## 2023-09-17 DIAGNOSIS — H01005 Unspecified blepharitis left lower eyelid: Secondary | ICD-10-CM | POA: Insufficient documentation

## 2023-09-17 DIAGNOSIS — I10 Essential (primary) hypertension: Secondary | ICD-10-CM | POA: Diagnosis not present

## 2023-09-17 DIAGNOSIS — E785 Hyperlipidemia, unspecified: Secondary | ICD-10-CM

## 2023-09-17 DIAGNOSIS — J449 Chronic obstructive pulmonary disease, unspecified: Secondary | ICD-10-CM | POA: Diagnosis not present

## 2023-09-17 DIAGNOSIS — H01002 Unspecified blepharitis right lower eyelid: Secondary | ICD-10-CM | POA: Insufficient documentation

## 2023-09-17 DIAGNOSIS — Z7951 Long term (current) use of inhaled steroids: Secondary | ICD-10-CM | POA: Insufficient documentation

## 2023-09-17 DIAGNOSIS — J45909 Unspecified asthma, uncomplicated: Secondary | ICD-10-CM | POA: Diagnosis not present

## 2023-09-17 DIAGNOSIS — G4730 Sleep apnea, unspecified: Secondary | ICD-10-CM | POA: Diagnosis not present

## 2023-09-17 DIAGNOSIS — H01004 Unspecified blepharitis left upper eyelid: Secondary | ICD-10-CM | POA: Diagnosis not present

## 2023-09-17 DIAGNOSIS — H02834 Dermatochalasis of left upper eyelid: Secondary | ICD-10-CM | POA: Insufficient documentation

## 2023-09-17 DIAGNOSIS — J4489 Other specified chronic obstructive pulmonary disease: Secondary | ICD-10-CM | POA: Diagnosis not present

## 2023-09-17 DIAGNOSIS — H01001 Unspecified blepharitis right upper eyelid: Secondary | ICD-10-CM | POA: Insufficient documentation

## 2023-09-17 HISTORY — PX: CATARACT EXTRACTION W/PHACO: SHX586

## 2023-09-17 SURGERY — PHACOEMULSIFICATION, CATARACT, WITH IOL INSERTION
Anesthesia: Monitor Anesthesia Care | Site: Eye | Laterality: Left

## 2023-09-17 MED ORDER — MIDAZOLAM HCL 2 MG/2ML IJ SOLN
INTRAMUSCULAR | Status: AC
  Filled 2023-09-17: qty 2

## 2023-09-17 MED ORDER — STERILE WATER FOR IRRIGATION IR SOLN
Status: DC | PRN
  Administered 2023-09-17: 250 mL

## 2023-09-17 MED ORDER — POVIDONE-IODINE 5 % OP SOLN
OPHTHALMIC | Status: DC | PRN
  Administered 2023-09-17: 1 via OPHTHALMIC

## 2023-09-17 MED ORDER — SODIUM CHLORIDE 0.9% FLUSH
INTRAVENOUS | Status: DC | PRN
  Administered 2023-09-17: 10 mL via INTRAVENOUS

## 2023-09-17 MED ORDER — PHENYLEPHRINE HCL 2.5 % OP SOLN
1.0000 [drp] | OPHTHALMIC | Status: AC | PRN
  Administered 2023-09-17 (×3): 1 [drp] via OPHTHALMIC

## 2023-09-17 MED ORDER — SODIUM HYALURONATE 10 MG/ML IO SOLUTION
PREFILLED_SYRINGE | INTRAOCULAR | Status: DC | PRN
  Administered 2023-09-17: .85 mL via INTRAOCULAR

## 2023-09-17 MED ORDER — SODIUM HYALURONATE 23MG/ML IO SOSY
PREFILLED_SYRINGE | INTRAOCULAR | Status: DC | PRN
  Administered 2023-09-17: .6 mL via INTRAOCULAR

## 2023-09-17 MED ORDER — DEXAMETHASONE 0.4 MG OP INST
VAGINAL_INSERT | OPHTHALMIC | Status: AC
  Filled 2023-09-17: qty 1

## 2023-09-17 MED ORDER — BSS IO SOLN
INTRAOCULAR | Status: DC | PRN
  Administered 2023-09-17: 15 mL via INTRAOCULAR

## 2023-09-17 MED ORDER — EPINEPHRINE PF 1 MG/ML IJ SOLN
INTRAMUSCULAR | Status: DC | PRN
  Administered 2023-09-17: 500 mL

## 2023-09-17 MED ORDER — TROPICAMIDE 1 % OP SOLN
1.0000 [drp] | OPHTHALMIC | Status: AC | PRN
  Administered 2023-09-17 (×3): 1 [drp] via OPHTHALMIC

## 2023-09-17 MED ORDER — LIDOCAINE HCL 3.5 % OP GEL
1.0000 | Freq: Once | OPHTHALMIC | Status: AC
  Administered 2023-09-17: 1 via OPHTHALMIC

## 2023-09-17 MED ORDER — LACTATED RINGERS IV SOLN: INTRAVENOUS | Status: DC

## 2023-09-17 MED ORDER — TETRACAINE HCL 0.5 % OP SOLN
1.0000 [drp] | OPHTHALMIC | Status: AC | PRN
  Administered 2023-09-17 (×3): 1 [drp] via OPHTHALMIC

## 2023-09-17 MED ORDER — LIDOCAINE HCL (PF) 1 % IJ SOLN
INTRAOCULAR | Status: DC | PRN
  Administered 2023-09-17: 1 mL via OPHTHALMIC

## 2023-09-17 MED ORDER — MOXIFLOXACIN HCL 5 MG/ML IO SOLN
INTRAOCULAR | Status: DC | PRN
  Administered 2023-09-17: .3 mL via INTRACAMERAL

## 2023-09-17 MED ORDER — MIDAZOLAM HCL 5 MG/5ML IJ SOLN
INTRAMUSCULAR | Status: DC | PRN
  Administered 2023-09-17: 1 mg via INTRAVENOUS

## 2023-09-17 SURGICAL SUPPLY — 13 items
CATARACT SUITE SIGHTPATH (MISCELLANEOUS) ×1 IMPLANT
CLOTH BEACON ORANGE TIMEOUT ST (SAFETY) ×1 IMPLANT
EYE SHIELD UNIVERSAL CLEAR (GAUZE/BANDAGES/DRESSINGS) IMPLANT
FEE CATARACT SUITE SIGHTPATH (MISCELLANEOUS) ×1 IMPLANT
GLOVE BIOGEL PI IND STRL 7.0 (GLOVE) ×2 IMPLANT
GOWN STRL REUS W/TWL LRG LVL3 (GOWN DISPOSABLE) IMPLANT
LENS IOL TECNIS EYHANCE 25.5 (Intraocular Lens) IMPLANT
NDL HYPO 18GX1.5 BLUNT FILL (NEEDLE) ×1 IMPLANT
NEEDLE HYPO 18GX1.5 BLUNT FILL (NEEDLE) ×1 IMPLANT
PAD ARMBOARD 7.5X6 YLW CONV (MISCELLANEOUS) ×1 IMPLANT
SYR TB 1ML LL NO SAFETY (SYRINGE) ×1 IMPLANT
TAPE PAPER 2X10 WHT MICROPORE (GAUZE/BANDAGES/DRESSINGS) IMPLANT
WATER STERILE IRR 250ML POUR (IV SOLUTION) ×1 IMPLANT

## 2023-09-17 NOTE — Op Note (Signed)
 Date of procedure: 09/17/23  Pre-operative diagnosis: Visually significant age-related nuclear cataract, Left Eye (H25.12)  Post-operative diagnosis: Visually significant age-related nuclear cataract, Left Eye  Procedure: Removal of cataract via phacoemulsification and insertion of intra-ocular lens Laural Benes and Johnson DIB00 +25.5D into the capsular bag of the Left Eye  Attending surgeon: Rudy Jew. Lubna Stegeman, MD, MA  Anesthesia: MAC, Topical Akten  Complications: None  Estimated Blood Loss: <25mL (minimal)  Specimens: None  Implants: As above  Indications:  Visually significant age-related cataract, Left Eye  Procedure:  The patient was seen and identified in the pre-operative area. The operative eye was identified and dilated.  The operative eye was marked.  Topical anesthesia was administered to the operative eye.     The patient was then to the operative suite and placed in the supine position.  A timeout was performed confirming the patient, procedure to be performed, and all other relevant information.   The patient's face was prepped and draped in the usual fashion for intra-ocular surgery.  A lid speculum was placed into the operative eye and the surgical microscope moved into place and focused.  An inferotemporal paracentesis was created using a 20 gauge paracentesis blade.  Shugarcaine was injected into the anterior chamber.  Viscoelastic was injected into the anterior chamber.  A temporal clear-corneal main wound incision was created using a 2.77mm microkeratome.  A continuous curvilinear capsulorrhexis was initiated using an irrigating cystitome and completed using capsulorrhexis forceps.  Hydrodissection and hydrodeliniation were performed.  Viscoelastic was injected into the anterior chamber.  A phacoemulsification handpiece and a chopper as a second instrument were used to remove the nucleus and epinucleus. The irrigation/aspiration handpiece was used to remove any remaining  cortical material.   The capsular bag was reinflated with viscoelastic, checked, and found to be intact.  The intraocular lens was inserted into the capsular bag.  The irrigation/aspiration handpiece was used to remove any remaining viscoelastic.  The clear corneal wound and paracentesis wounds were then hydrated and checked with Weck-Cels to be watertight. 0.66mL of moxifloxacin was injected into the anterior chamber. The lid-speculum and drape was removed, and the patient's face was cleaned with a wet and dry 4x4. A clear shield was taped over the eye. The patient was taken to the post-operative care unit in good condition, having tolerated the procedure well.  Post-Op Instructions: The patient will follow up at [facility] for a same day post-operative evaluation and will receive all other orders and instructions.

## 2023-09-17 NOTE — Discharge Instructions (Addendum)
 Please discharge patient when stable, will follow up today with Dr. June Leap at the [facility] office immediately following discharge.  Leave shield in place until visit.  All paperwork with discharge instructions will be given at the office.  [facility] Address:  [address]  [place], [place] [address]

## 2023-09-17 NOTE — Anesthesia Postprocedure Evaluation (Signed)
 Anesthesia Post Note  Patient: Vanessa Mcconnell  Procedure(s) Performed: PHACOEMULSIFICATION, CATARACT, WITH IOL INSERTION (Left: Eye)  Patient location during evaluation: Short Stay Anesthesia Type: MAC Level of consciousness: awake Pain management: pain level controlled Vital Signs Assessment: post-procedure vital signs reviewed and stable Respiratory status: spontaneous breathing Cardiovascular status: blood pressure returned to baseline and stable Postop Assessment: no apparent nausea or vomiting Anesthetic complications: no   No notable events documented.   Last Vitals:  Vitals:   09/17/23 1022 09/17/23 1150  BP: (!) 143/90 (!) 141/96  Pulse: 68 63  Resp: 13 13  Temp: 36.7 C 36.5 C  SpO2: 97% 98%    Last Pain:  Vitals:   09/17/23 1150  TempSrc: Oral  PainSc: 0-No pain                 Ziad Maye

## 2023-09-17 NOTE — Anesthesia Preprocedure Evaluation (Signed)
 Anesthesia Evaluation  Patient identified by MRN, date of birth, ID band Patient awake    Reviewed: Allergy & Precautions, H&P , NPO status , Patient's Chart, lab work & pertinent test results, reviewed documented beta blocker date and time   History of Anesthesia Complications (+) MALIGNANT HYPERTHERMIA and history of anesthetic complications  Airway Mallampati: II  TM Distance: >3 FB Neck ROM: full    Dental no notable dental hx.    Pulmonary neg pulmonary ROS, asthma , sleep apnea , pneumonia, COPD   Pulmonary exam normal breath sounds clear to auscultation       Cardiovascular Exercise Tolerance: Good hypertension, + DOE  negative cardio ROS  Rhythm:regular Rate:Normal     Neuro/Psych Seizures -,   Neuromuscular disease negative neurological ROS  negative psych ROS   GI/Hepatic negative GI ROS, Neg liver ROS,GERD  ,,  Endo/Other  negative endocrine ROS    Renal/GU negative Renal ROS  negative genitourinary   Musculoskeletal   Abdominal   Peds  Hematology negative hematology ROS (+)   Anesthesia Other Findings   Reproductive/Obstetrics negative OB ROS                             Anesthesia Physical Anesthesia Plan  ASA: 3  Anesthesia Plan: MAC   Post-op Pain Management:    Induction:   PONV Risk Score and Plan:   Airway Management Planned:   Additional Equipment:   Intra-op Plan:   Post-operative Plan:   Informed Consent: I have reviewed the patients History and Physical, chart, labs and discussed the procedure including the risks, benefits and alternatives for the proposed anesthesia with the patient or authorized representative who has indicated his/her understanding and acceptance.     Dental Advisory Given  Plan Discussed with: CRNA  Anesthesia Plan Comments:        Anesthesia Quick Evaluation

## 2023-09-17 NOTE — Transfer of Care (Signed)
 Immediate Anesthesia Transfer of Care Note  Patient: Vanessa Mcconnell  Procedure(s) Performed: PHACOEMULSIFICATION, CATARACT, WITH IOL INSERTION (Left: Eye)  Patient Location: Short Stay  Anesthesia Type:MAC  Level of Consciousness: awake  Airway & Oxygen Therapy: Patient Spontanous Breathing  Post-op Assessment: Report given to RN  Post vital signs: Reviewed and stable  Last Vitals:  Vitals Value Taken Time  BP 141/96 09/17/23 1150  Temp 36.5 C 09/17/23 1150  Pulse 63 09/17/23 1150  Resp 13 09/17/23 1150  SpO2 98 % 09/17/23 1150    Last Pain:  Vitals:   09/17/23 1150  TempSrc: Oral  PainSc: 0-No pain         Complications: No notable events documented.

## 2023-09-17 NOTE — Interval H&P Note (Signed)
 History and Physical Interval Note:  09/17/2023 11:16 AM  Vanessa Mcconnell  has presented today for surgery, with the diagnosis of age related nuclear cataract, left eye.  The various methods of treatment have been discussed with the patient and family. After consideration of risks, benefits and other options for treatment, the patient has consented to  Procedure(s): PHACOEMULSIFICATION, CATARACT, WITH IOL INSERTION (Left) as a surgical intervention.  The patient's history has been reviewed, patient examined, no change in status, stable for surgery.  I have reviewed the patient's chart and labs.  Questions were answered to the patient's satisfaction.     [name]

## 2023-09-20 ENCOUNTER — Encounter (HOSPITAL_COMMUNITY): Payer: Self-pay | Admitting: Ophthalmology

## 2023-09-27 DIAGNOSIS — H2511 Age-related nuclear cataract, right eye: Secondary | ICD-10-CM | POA: Diagnosis not present

## 2023-10-04 ENCOUNTER — Encounter: Admitting: Physical Medicine and Rehabilitation

## 2023-10-04 NOTE — H&P (Addendum)
 Surgical History & Physical  Patient Name: Vanessa Mcconnell DOB: 28-May-1961  Surgery: Cataract extraction with intraocular lens implant phacoemulsification; Right Eye  Surgeon: [name] MD Surgery Date:  10-15-23 Pre-Op Date:  09-23-23  HPI: A 9 Yr. old female patient\r\n1. The patient is returning after cataract surgery. The left eye is affected. Since the last visit, the affected area is doing well. Pt. states vision is blurred. The condition\r\n\'s severity is constant. Patient is following medication instructions.\r\nPatient also presents for continued blurred vision in her right eye. The patient has trouble reading in lower light and driving at night due to glare from headlights. This is negatively affecting the patient\'s quality of life and the patient is unable to function adequately in life with the current level of vision. Pt. is ready to proceed with surgery on OD due to blurred vision, which affects daily activities.\r\nHPI was performed by [name] .  Medical History: Dry Eyes Cataracts Arthritis Heart Problem High Blood Pressure Lung Problems  Review of Systems Cardiovascular High Blood Pressure, heart problems Musculoskeletal arthritis pains, fibromyalgia Neurological Multiple Sclerosis Respiratory lung problems All recorded systems are negative except as noted above.  Social   Unknown if ever smoked   Medication Prednisolone-moxiflox-bromfen,  ondansetron HCl ,  alendronate ,  hydrochlorothiazide ,  gabapentin ,  amlodipine ,  meloxicam ,  metoprolol succinate ,  albuterol sulfate ,  azithromycin ,  prazosin ,  pantoprazole ,  potassium chloride ,  rosuvastatin ,  duloxetine ,  Advair HFA ,  levetiracetam ,   Sx/Procedures Phaco c IOL OS,   Drug Allergies  Penicillin, Tetracycline, Ct scan dye,   History & Physical: Heent: cataract; right eye NECK: supple without bruits LUNGS: lungs clear to auscultation CV: regular rate and rhythm Abdomen:  soft and non-tender Impression & Plan: Assessment: 1.  CATARACT EXTRACTION STATUS; Left Eye (Z98.42) 2.  NUCLEAR SCLEROSIS AGE RELATED; , Right Eye (H25.11)  Plan: 1.  1 week after cataract surgery. Doing well with improved vision and normal eye pressure. Call with any problems or concerns. Continue Pred-Moxi-Brom 2x/day for 3 more weeks.  2.  Cataract accounts for the patient's decreased vision. This visual impairment is not correctable with a tolerable change in glasses or contact lenses. Cataract surgery with an implantation of a new lens should significantly improve the visual and functional status of the patient. Discussed all risks, benefits, alternatives, and potential complications. Discussed the procedures and recovery. Patient desires to have surgery. A-scan ordered and performed today for intra-ocular lens calculations. The surgery will be performed in order to improve vision for driving, reading, and for eye examinations. Recommend phacoemulsification with intra-ocular lens. Recommend Dextenza for post-operative pain and inflammation. Right Eye. Surgery required to correct imbalance of vision. Dilates well - shugarcaine by protocol.

## 2023-10-05 ENCOUNTER — Other Ambulatory Visit: Payer: Self-pay | Admitting: Neurology

## 2023-10-05 NOTE — Telephone Encounter (Signed)
 Last seen on 11/25/22 Follow up scheduled on 10/30/23

## 2023-10-10 NOTE — Progress Notes (Unsigned)
 Vanessa Mcconnell, female    DOB: July 31, 1960    MRN: [id number]   Brief patient profile:  51 yowf never smoker/MM  heavy passive smoke exposure with sinus and ear infections as child and much less exp since the age of 34 referred to pulmonary clinic in [place]  07/30/2022 by Dr Sherril Croon  for dx of  copd vs AB with freq flares between 2018-2019 and some better on anoro since then    History of Present Illness  07/30/2022  Pulmonary/ 1st office eval/ Connie Hilgert / [place] [facility]  Chief Complaint  Patient presents with   Consult    Self referral for COPD asthma   Dyspnea:  does grocery shopping ok/ flat to MB x 300 and stops before landing assoc with chest tightness x ? 5 y f/b cards already Cough: clear mucus stuffy head and ear on cipro 1/17  Sleep: flat with one pillow SABA use: once a day up 3 x  02: none  Rec Plan A = Automatic = Always=    Breztri Take 2 puffs first thing in am and then another 2 puffs about 12 hours later.  Work on inhaler technique:  Plan B = Backup (to supplement plan A, not to replace it) Only use your albuterol inhaler as a rescue medication  Please schedule a follow up visit in 3 months but call sooner if needed  - PFTs at [facility] next available   Labs 07/30/2022  :    alpha one AT phenotype MM/ Eos 0.2 / IgE   5   - PFTs  07/31/22 slt blunted exp loop only in effort dep portion/ o/w completely wnl (no rx prior)    12/14/2022  f/u ov/[place] office/Christinea Brizuela re: AB maint on Breztri   Chief Complaint  Patient presents with   Follow-up    Pt f/u states that she is feeling "weak/drowsy/dizzy", she reports that 6/2 she had a seizure in the early morning and when she went to take some ibuprofen she grabbed the wrong bottle and took 4- 75mg  diclofenac (husband's prescription)    Dyspnea:  eliptical x 15 min / rowing x 10 / bike 30 min  Cough: assoc nasal congestion  Sleeping: flat bed/ one pillow/ wheezing does not disturb her sleep SABA use: none  02: none    Rec Pantoprazole (protonix) 40 mg  Take  30-60 min before first meal of the day and ADD  Pepcid (famotidine)  20 mg after supper   GERD diet reviewed, bed blocks rec  Plan A = Automatic = Always=    Breztri (symbicort 80) Take 2 puffs first thing in am and then another 2 puffs about 12 hours later.   Work on inhaler technique: >>>  Remember how golfers warm up by taking practice swings - do this with an empty inhaler  Plan B = Backup  (to supplement plan A, not to replace it) Only use your albuterol inhaler as a rescue medication  06/15/2023  f/u ov/[place] office/Mykelle Cockerell re:  AB maint on no maint rx (did not pick up rx) Chief Complaint  Patient presents with   Asthma   Shortness of Breath  Dyspnea:  limited by back more than breathing x sev weeks  Cough:  this am started up worse cough > clear mucus but no st/ fever / ha  Sleeping:  post cp some better supine -  SABA use: avg twice daily  02: none   Rec Plan A = Automatic = Always=  Symbicort 80   Work on inhaler technique:   Plan B = Backup (to supplement plan A, not to replace it) Only use your albuterol inhaler as a rescue medication GERD diet reviewed, bed blocks rec  Depomedrol 120 mgIM Stop fosamax x next 6 weeks   Labs ok / cxr ok 06/15/23     10/13/2023  f/u ov/[place] office/Abbe Bula re: AB maint on symbicort  80    Cc sob/ back pain  Dyspnea:  can't do treadmill  Cough: sporadic / sometimes smells  Sleeping: flat / 2 pillows resp cc  SABA use: occ saba / none noct  02: none       No obvious day to day or daytime variability or assoc excess/ purulent sputum or mucus plugs or hemoptysis or cp or chest tightness, subjective wheeze or overt sinus or hb symptoms.    Also denies any obvious fluctuation of symptoms with weather or environmental changes or other aggravating or alleviating factors except as outlined above   No unusual exposure hx or h/o childhood pna/ asthma or knowledge of premature birth.  Current  Allergies, Complete Past Medical History, Past Surgical History, Family History, and Social History were reviewed in [employer] record.  ROS  The following are not active complaints unless bolded Hoarseness, sore throat, dysphagia, dental problems, itching, sneezing,  nasal congestion or discharge of excess mucus or purulent secretions, ear ache,   fever, chills, sweats, unintended wt loss or wt gain, classically pleuritic or exertional cp,  orthopnea pnd or arm/hand swelling  or leg swelling, presyncope, palpitations, abdominal pain, anorexia, nausea, vomiting, diarrhea  or change in bowel habits or change in bladder habits, change in stools or change in urine, dysuria, hematuria,  rash, arthralgias, visual complaints, headache, numbness, weakness or ataxia or problems with walking or coordination,  change in mood or  memory.        Current Meds  Medication Sig   albuterol (PROVENTIL HFA;VENTOLIN HFA) 108 (90 Base) MCG/ACT inhaler Inhale into the lungs every 6 (six) hours as needed for wheezing or shortness of breath.   alendronate (FOSAMAX) 70 MG tablet Take 70 mg by mouth once a week.   amLODipine (NORVASC) 5 MG tablet Take 1 tablet (5 mg total) by mouth daily.   Cholecalciferol (VITAMIN D3) 125 MCG (5000 UT) TABS Take by mouth. Per patient taking with Calcium   DULoxetine (CYMBALTA) 60 MG capsule Take 1 capsule by mouth at bedtime   famotidine (PEPCID) 20 MG tablet One after supper   fluticasone (FLONASE) 50 MCG/ACT nasal spray as needed.   fluticasone-salmeterol (ADVAIR HFA) 45-21 MCG/ACT inhaler Take 2 puffs first thing in am and then another 2 puffs about 12 hours later.   gabapentin (NEURONTIN) 300 MG capsule One po qAM, one po qNoon, one po qEvening and two po qhs   hydrochlorothiazide (MICROZIDE) 12.5 MG capsule Take 12.5 mg by mouth daily.   levETIRAcetam (KEPPRA XR) 750 MG 24 hr tablet Take 2 tablets by mouth at bedtime, 9pm   meclizine (ANTIVERT) 25 MG tablet  Take 25 mg by mouth 3 (three) times daily as needed.   meloxicam (MOBIC) 15 MG tablet Take 15 mg by mouth daily.   metoprolol succinate (TOPROL-XL) 25 MG 24 hr tablet Take 25 mg by mouth 2 (two) times daily.   ondansetron (ZOFRAN) 4 MG tablet Take 4 mg by mouth as needed.   pantoprazole (PROTONIX) 40 MG tablet Take 1 tablet (40 mg total) by mouth daily. 30 minutes  before breakfast   potassium chloride (MICRO-K) 10 MEQ CR capsule Take 10 mEq by mouth daily.   prazosin (MINIPRESS) 2 MG capsule Take 2 mg by mouth at bedtime and may repeat dose one time if needed.   rosuvastatin (CRESTOR) 5 MG tablet Take 5 mg by mouth once a week.   tiZANidine (ZANAFLEX) 4 MG tablet Take 1 tablet (4 mg total) by mouth 2 (two) times daily as needed for muscle spasms.   vitamin B-12 (CYANOCOBALAMIN) 100 MCG tablet Take 100 mcg by mouth daily. Per patient taking a gummie   vitamin C (ASCORBIC ACID) 500 MG tablet Take 500 mg by mouth daily.   zinc gluconate 50 MG tablet Take 50 mg by mouth daily.        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/2015   Positive breath test   Thoracic disc herniation       Objective:    Wts  10/13/2023        182   06/15/2023      181   12/14/22 178 lb 3.2 oz (80.8 kg)  11/25/22 178 lb (80.7 kg)  08/06/22 174 lb (78.9 kg)     Vital signs reviewed  10/13/2023  - Note at rest 02 sats  95% on RA   General appearance:    amb wf nad    k    HEENT : Oropharynx  clear       Nasal turbinates nl    NECK :  without  apparent JVD/ palpable Nodes/TM    LUNGS: no acc muscle use,  Mild kyphoscoliotic  contour chest which is clear to A and P bilaterally without cough on insp or exp maneuvers   CV:  RRR  no s3  or murmur or increase in P2, and no edema   ABD:  soft and nontender   MS:  Gait nl   ext warm without deformities Or obvious joint restrictions  calf tenderness, cyanosis or clubbing    SKIN: warm and dry without lesions    NEURO:  alert, approp, nl sensorium with  no motor or cerebellar deficits apparent.          Assessment

## 2023-10-11 ENCOUNTER — Encounter (HOSPITAL_COMMUNITY)
Admission: RE | Admit: 2023-10-11 | Discharge: 2023-10-11 | Disposition: A | Discharge status: Home / Self Care | Payer: No Typology Code available for payment source | Source: Ambulatory Visit | Attending: Ophthalmology | Admitting: Ophthalmology

## 2023-10-13 ENCOUNTER — Ambulatory Visit: Admitting: Internal Medicine

## 2023-10-13 ENCOUNTER — Encounter: Payer: Self-pay | Admitting: Internal Medicine

## 2023-10-13 VITALS — BP 128/75 | HR 62 | Ht 62.0 in | Wt 182.0 lb

## 2023-10-13 DIAGNOSIS — J4489 Other specified chronic obstructive pulmonary disease: Secondary | ICD-10-CM

## 2023-10-13 MED ORDER — BUDESONIDE-FORMOTEROL FUMARATE 160-4.5 MCG/ACT IN AERO: INHALATION_SPRAY | RESPIRATORY_TRACT | 11 refills | Status: DC

## 2023-10-13 NOTE — Assessment & Plan Note (Signed)
 Passive exposure to cigarettes growing up/MM - 07/30/2022  After extensive coaching inhaler device,  effectiveness =  75% (short ti) -Labs ordered 07/30/2022  :      alpha one AT phenotype MM/ Eos 0.2 / IgE   5 - 07/30/2022   Walked on RA  x  3  lap(s) =  approx 450  ft  @ mod fast pace, stopped due to end of study with lowest 02 sats 94% and mild chest tightness on 2nd/3rd lap   - PFTs  07/31/22 slt blunted exp loop in effort dep portion/ o/w completely wnl  - 12/14/2022  After extensive coaching inhaler device,  effectiveness =    50% (short Ti)  - 06/15/2023  After extensive coaching inhaler device,  effectiveness =    60% > retry starting symbicort 80 2bid as still has very sensitive upper airway coughing on insp with saba demonstration and set off by perfumes by hx -  10/13/2023 try symb 160 2bid   Cough was better on symbicort 90 than advair 45 so but still present on both while on fosamax so rec trial of symbicort 160 and consideration for relcast once yearly   F/u can be q 6 m sooner prn          Each maintenance medication was reviewed in detail including emphasizing most importantly the difference between maintenance and prns and under what circumstances the prns are to be triggered using an action plan format where appropriate.  Total time for H and P, chart review, counseling, reviewing hfa device(s) and generating customized AVS unique to this office visit / same day charting = 25 min

## 2023-10-13 NOTE — Patient Instructions (Signed)
 Also  Ok to try albuterol 15 min before an activity (on alternating days)  that you know would usually make you short of breath and see if it makes any difference and if makes none then don't take albuterol after activity unless you can't catch your breath as this means it's the resting that helps, not the albuterol.      Symbicort 160 Take 2 puffs first thing in am and then another 2 puffs about 12 hours later.

## 2023-10-14 NOTE — Anesthesia Preprocedure Evaluation (Signed)
 Anesthesia Evaluation  Patient identified by MRN, date of birth, ID band Patient awake    Reviewed: Allergy & Precautions, H&P , NPO status , Patient's Chart, lab work & pertinent test results, reviewed documented beta blocker date and time   History of Anesthesia Complications (+) MALIGNANT HYPERTHERMIA and history of anesthetic complications  Airway Mallampati: II  TM Distance: >3 FB Neck ROM: full    Dental no notable dental hx. (+) Dental Advisory Given, Teeth Intact   Pulmonary neg pulmonary ROS, asthma , sleep apnea , pneumonia, COPD   Pulmonary exam normal breath sounds clear to auscultation       Cardiovascular Exercise Tolerance: Good hypertension, + DOE  Normal cardiovascular exam+ Valvular Problems/Murmurs MVP  Rhythm:regular Rate:Normal     Neuro/Psych Seizures -,  MS  Neuromuscular disease negative neurological ROS  negative psych ROS   GI/Hepatic negative GI ROS, Neg liver ROS,GERD  ,,pancreatitis   Endo/Other  negative endocrine ROS    Renal/GU negative Renal ROS  negative genitourinary   Musculoskeletal  (+) Arthritis , Osteoarthritis,  Fibromyalgia -  Abdominal   Peds  Hematology negative hematology ROS (+)   Anesthesia Other Findings   Reproductive/Obstetrics negative OB ROS                             Anesthesia Physical Anesthesia Plan  ASA: 3  Anesthesia Plan: MAC   Post-op Pain Management: Minimal or no pain anticipated   Induction:   PONV Risk Score and Plan:   Airway Management Planned: Nasal Cannula and Natural Airway  Additional Equipment: None  Intra-op Plan:   Post-operative Plan:   Informed Consent: I have reviewed the patients History and Physical, chart, labs and discussed the procedure including the risks, benefits and alternatives for the proposed anesthesia with the patient or authorized representative who has indicated his/her  understanding and acceptance.     Dental Advisory Given  Plan Discussed with: CRNA  Anesthesia Plan Comments:        Anesthesia Quick Evaluation

## 2023-10-15 ENCOUNTER — Ambulatory Visit (HOSPITAL_COMMUNITY): Payer: Self-pay | Admitting: Anesthesiology

## 2023-10-15 ENCOUNTER — Ambulatory Visit (HOSPITAL_BASED_OUTPATIENT_CLINIC_OR_DEPARTMENT_OTHER): Payer: Self-pay | Admitting: Anesthesiology

## 2023-10-15 ENCOUNTER — Ambulatory Visit (HOSPITAL_COMMUNITY)
Admission: RE | Admit: 2023-10-15 | Discharge: 2023-10-15 | Disposition: A | Discharge status: Home / Self Care | Payer: No Typology Code available for payment source | Attending: Ophthalmology | Admitting: Ophthalmology

## 2023-10-15 ENCOUNTER — Encounter (HOSPITAL_COMMUNITY)
Admission: RE | Disposition: A | Discharge status: Home / Self Care | Payer: Self-pay | Source: Home / Self Care | Attending: Ophthalmology

## 2023-10-15 DIAGNOSIS — J4489 Other specified chronic obstructive pulmonary disease: Secondary | ICD-10-CM | POA: Insufficient documentation

## 2023-10-15 DIAGNOSIS — I10 Essential (primary) hypertension: Secondary | ICD-10-CM

## 2023-10-15 DIAGNOSIS — I341 Nonrheumatic mitral (valve) prolapse: Secondary | ICD-10-CM | POA: Diagnosis not present

## 2023-10-15 DIAGNOSIS — G4733 Obstructive sleep apnea (adult) (pediatric): Secondary | ICD-10-CM

## 2023-10-15 DIAGNOSIS — G4730 Sleep apnea, unspecified: Secondary | ICD-10-CM | POA: Diagnosis not present

## 2023-10-15 DIAGNOSIS — H2511 Age-related nuclear cataract, right eye: Secondary | ICD-10-CM | POA: Diagnosis not present

## 2023-10-15 DIAGNOSIS — K219 Gastro-esophageal reflux disease without esophagitis: Secondary | ICD-10-CM | POA: Insufficient documentation

## 2023-10-15 DIAGNOSIS — M797 Fibromyalgia: Secondary | ICD-10-CM | POA: Diagnosis not present

## 2023-10-15 DIAGNOSIS — G35 Multiple sclerosis: Secondary | ICD-10-CM | POA: Diagnosis not present

## 2023-10-15 DIAGNOSIS — J449 Chronic obstructive pulmonary disease, unspecified: Secondary | ICD-10-CM

## 2023-10-15 HISTORY — PX: CATARACT EXTRACTION W/PHACO: SHX586

## 2023-10-15 SURGERY — PHACOEMULSIFICATION, CATARACT, WITH IOL INSERTION
Anesthesia: Monitor Anesthesia Care | Site: Eye | Laterality: Right

## 2023-10-15 MED ORDER — TETRACAINE HCL 0.5 % OP SOLN
1.0000 [drp] | OPHTHALMIC | Status: AC | PRN
  Administered 2023-10-15 (×3): 1 [drp] via OPHTHALMIC

## 2023-10-15 MED ORDER — SODIUM HYALURONATE 10 MG/ML IO SOLUTION
PREFILLED_SYRINGE | INTRAOCULAR | Status: DC | PRN
Start: 2023-10-15 — End: 2023-10-15
  Administered 2023-10-15: .85 mL via INTRAOCULAR

## 2023-10-15 MED ORDER — POVIDONE-IODINE 5 % OP SOLN
OPHTHALMIC | Status: DC | PRN
  Administered 2023-10-15: 1 via OPHTHALMIC

## 2023-10-15 MED ORDER — TROPICAMIDE 1 % OP SOLN
1.0000 [drp] | OPHTHALMIC | Status: AC | PRN
  Administered 2023-10-15 (×3): 1 [drp] via OPHTHALMIC

## 2023-10-15 MED ORDER — BSS IO SOLN
INTRAOCULAR | Status: DC | PRN
  Administered 2023-10-15: 500 mL

## 2023-10-15 MED ORDER — STERILE WATER FOR IRRIGATION IR SOLN
Status: DC | PRN
  Administered 2023-10-15: 250 mL

## 2023-10-15 MED ORDER — PHENYLEPHRINE HCL 2.5 % OP SOLN
1.0000 [drp] | OPHTHALMIC | Status: AC | PRN
  Administered 2023-10-15 (×3): 1 [drp] via OPHTHALMIC

## 2023-10-15 MED ORDER — BSS IO SOLN
INTRAOCULAR | Status: DC | PRN
Start: 2023-10-15 — End: 2023-10-15
  Administered 2023-10-15: 15 mL via INTRAOCULAR

## 2023-10-15 MED ORDER — MIDAZOLAM HCL 2 MG/2ML IJ SOLN
INTRAMUSCULAR | Status: DC | PRN
  Administered 2023-10-15: 1 mg via INTRAVENOUS

## 2023-10-15 MED ORDER — MIDAZOLAM HCL 2 MG/2ML IJ SOLN
INTRAMUSCULAR | Status: AC
  Filled 2023-10-15: qty 2

## 2023-10-15 MED ORDER — LIDOCAINE HCL (PF) 1 % IJ SOLN
INTRAMUSCULAR | Status: AC
  Filled 2023-10-15: qty 2

## 2023-10-15 MED ORDER — SODIUM CHLORIDE 0.9% FLUSH
INTRAVENOUS | Status: DC | PRN
  Administered 2023-10-15: 8 mL via INTRAVENOUS

## 2023-10-15 MED ORDER — MOXIFLOXACIN HCL 5 MG/ML IO SOLN
INTRAOCULAR | Status: DC | PRN
  Administered 2023-10-15: .3 mL via INTRACAMERAL

## 2023-10-15 MED ORDER — LIDOCAINE HCL 3.5 % OP GEL
1.0000 | Freq: Once | OPHTHALMIC | Status: AC
  Administered 2023-10-15: 1 via OPHTHALMIC

## 2023-10-15 MED ORDER — SODIUM HYALURONATE 23MG/ML IO SOSY
PREFILLED_SYRINGE | INTRAOCULAR | Status: DC | PRN
  Administered 2023-10-15: .6 mL via INTRAOCULAR

## 2023-10-15 MED ORDER — LIDOCAINE HCL (PF) 1 % IJ SOLN
INTRAOCULAR | Status: DC | PRN
  Administered 2023-10-15: 1 mL via OPHTHALMIC

## 2023-10-15 SURGICAL SUPPLY — 12 items
CATARACT SUITE SIGHTPATH (MISCELLANEOUS) ×1 IMPLANT
CLOTH BEACON ORANGE TIMEOUT ST (SAFETY) ×1 IMPLANT
EYE SHIELD UNIVERSAL CLEAR (GAUZE/BANDAGES/DRESSINGS) IMPLANT
FEE CATARACT SUITE SIGHTPATH (MISCELLANEOUS) ×1 IMPLANT
GLOVE BIOGEL PI IND STRL 7.0 (GLOVE) ×2 IMPLANT
LENS IOL TECNIS EYHANCE 25.5 (Intraocular Lens) IMPLANT
NDL HYPO 18GX1.5 BLUNT FILL (NEEDLE) ×1 IMPLANT
NEEDLE HYPO 18GX1.5 BLUNT FILL (NEEDLE) ×1 IMPLANT
PAD ARMBOARD POSITIONER FOAM (MISCELLANEOUS) ×1 IMPLANT
SYR TB 1ML LL NO SAFETY (SYRINGE) ×1 IMPLANT
TAPE PAPER 2X10 WHT MICROPORE (GAUZE/BANDAGES/DRESSINGS) IMPLANT
WATER STERILE IRR 250ML POUR (IV SOLUTION) ×1 IMPLANT

## 2023-10-15 NOTE — Discharge Instructions (Addendum)
 Please discharge patient when stable, will follow up today with Dr. June Leap at the [facility] office immediately following discharge.  Leave shield in place until visit.  All paperwork with discharge instructions will be given at the office.  [facility] Address:  [address]  [place], [place] [address]

## 2023-10-15 NOTE — Anesthesia Postprocedure Evaluation (Signed)
 Anesthesia Post Note  Patient: Vanessa Mcconnell  Procedure(s) Performed: PHACOEMULSIFICATION, CATARACT, WITH IOL INSERTION (Right: Eye)  Patient location during evaluation: PACU Anesthesia Type: MAC Level of consciousness: awake and alert Pain management: pain level controlled Vital Signs Assessment: post-procedure vital signs reviewed and stable Respiratory status: spontaneous breathing, nonlabored ventilation, respiratory function stable and patient connected to nasal cannula oxygen Cardiovascular status: blood pressure returned to baseline and stable Postop Assessment: no apparent nausea or vomiting Anesthetic complications: no   There were no known notable events for this encounter.   Last Vitals:  Vitals:   10/15/23 0715 10/15/23 0814  BP:  129/78  Pulse: 64 63  Resp: 13 12  Temp:  36.5 C  SpO2: 98% 97%    Last Pain:  Vitals:   10/15/23 0814  TempSrc: Oral  PainSc: 0-No pain                 Hendrik Donath L Elda Dunkerson

## 2023-10-15 NOTE — Op Note (Signed)
 Date of procedure: 10/15/23  Pre-operative diagnosis: Visually significant age-related nuclear cataract, Right Eye (H25.11)  Post-operative diagnosis: Visually significant age-related nuclear cataract, Right Eye  Procedure: Removal of cataract via phacoemulsification and insertion of intra-ocular lens Laural Benes and Johnson DIB00 +25.5D into the capsular bag of the Right Eye  Attending surgeon: Rudy Jew. Karne Ozga, MD, MA  Anesthesia: MAC, Topical Akten  Complications: None  Estimated Blood Loss: <28mL (minimal)  Specimens: None  Implants: As above  Indications:  Visually significant age-related cataract, Right Eye  Procedure:  The patient was seen and identified in the pre-operative area. The operative eye was identified and dilated.  The operative eye was marked.  Topical anesthesia was administered to the operative eye.     The patient was then to the operative suite and placed in the supine position.  A timeout was performed confirming the patient, procedure to be performed, and all other relevant information.   The patient's face was prepped and draped in the usual fashion for intra-ocular surgery.  A lid speculum was placed into the operative eye and the surgical microscope moved into place and focused.  A superotemporal paracentesis was created using a 20 gauge paracentesis blade.  Shugarcaine was injected into the anterior chamber.  Viscoelastic was injected into the anterior chamber.  A temporal clear-corneal main wound incision was created using a 2.47mm microkeratome.  A continuous curvilinear capsulorrhexis was initiated using an irrigating cystitome and completed using capsulorrhexis forceps.  Hydrodissection and hydrodeliniation were performed.  Viscoelastic was injected into the anterior chamber.  A phacoemulsification handpiece and a chopper as a second instrument were used to remove the nucleus and epinucleus. The irrigation/aspiration handpiece was used to remove any remaining  cortical material.   The capsular bag was reinflated with viscoelastic, checked, and found to be intact.  The intraocular lens was inserted into the capsular bag.  The irrigation/aspiration handpiece was used to remove any remaining viscoelastic.  The clear corneal wound and paracentesis wounds were then hydrated and checked with Weck-Cels to be watertight. 0.26mL of moxifloxacin was injected into the anterior chamber. The lid-speculum and drape was removed, and the patient's face was cleaned with a wet and dry 4x4.  A clear shield was taped over the eye. The patient was taken to the post-operative care unit in good condition, having tolerated the procedure well.  Post-Op Instructions: The patient will follow up at [facility] for a same day post-operative evaluation and will receive all other orders and instructions.

## 2023-10-15 NOTE — Transfer of Care (Signed)
 Immediate Anesthesia Transfer of Care Note  Patient: Vanessa Mcconnell  Procedure(s) Performed: PHACOEMULSIFICATION, CATARACT, WITH IOL INSERTION (Right: Eye)  Patient Location: Short Stay  Anesthesia Type:MAC  Level of Consciousness: awake, alert , and oriented  Airway & Oxygen Therapy: Patient Spontanous Breathing  Post-op Assessment: Report given to RN and Post -op Vital signs reviewed and stable  Post vital signs: Reviewed and stable  Last Vitals:  Vitals Value Taken Time  BP    Temp    Pulse    Resp    SpO2      Last Pain:  Vitals:   10/15/23 0652  PainSc: 0-No pain         Complications: No notable events documented.

## 2023-10-15 NOTE — Interval H&P Note (Signed)
 History and Physical Interval Note:  10/15/2023 7:45 AM  Vanessa Mcconnell  has presented today for surgery, with the diagnosis of age related nuclear cataract, right eye.  The various methods of treatment have been discussed with the patient and family. After consideration of risks, benefits and other options for treatment, the patient has consented to  Procedure(s): PHACOEMULSIFICATION, CATARACT, WITH IOL INSERTION (Right) as a surgical intervention.  The patient's history has been reviewed, patient examined, no change in status, stable for surgery.  I have reviewed the patient's chart and labs.  Questions were answered to the patient's satisfaction.     [name]

## 2023-10-15 NOTE — Anesthesia Procedure Notes (Signed)
 Procedure Name: MAC Date/Time: 10/15/2023 7:51 AM  Performed by: Julian Reil, CRNAPre-anesthesia Checklist: Patient identified, Emergency Drugs available, Suction available and Patient being monitored Patient Re-evaluated:Patient Re-evaluated prior to induction Oxygen Delivery Method: Nasal cannula Placement Confirmation: positive ETCO2

## 2023-10-18 ENCOUNTER — Encounter (HOSPITAL_COMMUNITY): Payer: Self-pay | Admitting: Ophthalmology

## 2023-10-20 DIAGNOSIS — I10 Essential (primary) hypertension: Secondary | ICD-10-CM | POA: Diagnosis not present

## 2023-10-20 DIAGNOSIS — J449 Chronic obstructive pulmonary disease, unspecified: Secondary | ICD-10-CM | POA: Diagnosis not present

## 2023-10-20 DIAGNOSIS — Z1331 Encounter for screening for depression: Secondary | ICD-10-CM | POA: Diagnosis not present

## 2023-10-20 DIAGNOSIS — G35 Multiple sclerosis: Secondary | ICD-10-CM | POA: Diagnosis not present

## 2023-10-20 DIAGNOSIS — Z299 Encounter for prophylactic measures, unspecified: Secondary | ICD-10-CM | POA: Diagnosis not present

## 2023-10-20 DIAGNOSIS — Z6831 Body mass index (BMI) 31.0-31.9, adult: Secondary | ICD-10-CM | POA: Diagnosis not present

## 2023-10-20 DIAGNOSIS — Z1339 Encounter for screening examination for other mental health and behavioral disorders: Secondary | ICD-10-CM | POA: Diagnosis not present

## 2023-10-20 DIAGNOSIS — Z0000 Encounter for general adult medical examination without abnormal findings: Secondary | ICD-10-CM | POA: Diagnosis not present

## 2023-10-20 DIAGNOSIS — Z7189 Other specified counseling: Secondary | ICD-10-CM | POA: Diagnosis not present

## 2023-10-20 DIAGNOSIS — I700 Atherosclerosis of aorta: Secondary | ICD-10-CM | POA: Diagnosis not present

## 2023-10-27 ENCOUNTER — Telehealth: Payer: Self-pay

## 2023-10-27 NOTE — Telephone Encounter (Signed)
 Pt called the triage line and advised that she was sch for an injection but had to cx due to illness and she is ready to resch appt if you would please call pt to advise.

## 2023-11-02 ENCOUNTER — Telehealth: Payer: Self-pay | Admitting: Physical Medicine and Rehabilitation

## 2023-11-02 NOTE — Telephone Encounter (Signed)
 Pt needs to reschedule her appt for 11/03/23

## 2023-11-03 ENCOUNTER — Encounter: Admitting: Physical Medicine and Rehabilitation

## 2023-11-07 ENCOUNTER — Other Ambulatory Visit: Payer: Self-pay | Admitting: Neurology

## 2023-11-08 NOTE — Telephone Encounter (Signed)
 Last seen on 11/25/22 Follow up scheduled 11/29/23

## 2023-11-11 ENCOUNTER — Ambulatory Visit: Admitting: Physical Medicine and Rehabilitation

## 2023-11-11 ENCOUNTER — Other Ambulatory Visit: Payer: Self-pay

## 2023-11-11 VITALS — BP 141/91 | HR 69

## 2023-11-11 DIAGNOSIS — M5414 Radiculopathy, thoracic region: Secondary | ICD-10-CM

## 2023-11-11 MED ORDER — METHYLPREDNISOLONE ACETATE 40 MG/ML IJ SUSP
40.0000 mg | Freq: Once | INTRAMUSCULAR | Status: AC
  Administered 2023-11-11: 40 mg

## 2023-11-11 NOTE — Progress Notes (Signed)
 Pain Scale   Average Pain 0 Patient advising her pain comes and goes at different times , today she has little to no pain. Patient states she get pain from doing daily chores and other activities.        +Driver, -BT, -Dye Allergies.

## 2023-11-11 NOTE — Patient Instructions (Signed)

## 2023-11-17 ENCOUNTER — Telehealth: Payer: Self-pay | Admitting: Neurology

## 2023-11-17 DIAGNOSIS — M1990 Unspecified osteoarthritis, unspecified site: Secondary | ICD-10-CM | POA: Diagnosis not present

## 2023-11-17 DIAGNOSIS — Z6833 Body mass index (BMI) 33.0-33.9, adult: Secondary | ICD-10-CM | POA: Diagnosis not present

## 2023-11-17 DIAGNOSIS — E785 Hyperlipidemia, unspecified: Secondary | ICD-10-CM | POA: Diagnosis not present

## 2023-11-17 DIAGNOSIS — K219 Gastro-esophageal reflux disease without esophagitis: Secondary | ICD-10-CM | POA: Diagnosis not present

## 2023-11-17 DIAGNOSIS — G43909 Migraine, unspecified, not intractable, without status migrainosus: Secondary | ICD-10-CM | POA: Diagnosis not present

## 2023-11-17 DIAGNOSIS — Z008 Encounter for other general examination: Secondary | ICD-10-CM | POA: Diagnosis not present

## 2023-11-17 DIAGNOSIS — M810 Age-related osteoporosis without current pathological fracture: Secondary | ICD-10-CM | POA: Diagnosis not present

## 2023-11-17 DIAGNOSIS — G4089 Other seizures: Secondary | ICD-10-CM | POA: Diagnosis not present

## 2023-11-17 DIAGNOSIS — R2681 Unsteadiness on feet: Secondary | ICD-10-CM | POA: Diagnosis not present

## 2023-11-17 DIAGNOSIS — E669 Obesity, unspecified: Secondary | ICD-10-CM | POA: Diagnosis not present

## 2023-11-17 DIAGNOSIS — M545 Low back pain, unspecified: Secondary | ICD-10-CM | POA: Diagnosis not present

## 2023-11-17 DIAGNOSIS — I10 Essential (primary) hypertension: Secondary | ICD-10-CM | POA: Diagnosis not present

## 2023-11-17 NOTE — Telephone Encounter (Signed)
 r/s appointment Due to a conflict in schedule

## 2023-11-29 ENCOUNTER — Ambulatory Visit: Payer: No Typology Code available for payment source | Admitting: Family Medicine

## 2023-11-29 NOTE — Procedures (Signed)
 Thoracic epidural Steroid Injection - Interlaminar Approach with Fluoroscopic Guidance  Patient: Vanessa Mcconnell      Date of Birth: [birth year] MRN: [id number] PCP: Orlena Bitters, MD      Visit Date: 11/11/2023   Universal Protocol:     Consent Given By: the patient  Position: PRONE  Additional Comments: Vital signs were monitored before and after the procedure. Patient was prepped and draped in the usual sterile fashion. The correct patient, procedure, and site was verified.   Injection Procedure Details:   Procedure diagnoses:  1. Thoracic radiculopathy      Meds Administered:  Meds ordered this encounter  Medications   methylPREDNISolone  acetate (DEPO-MEDROL ) injection 40 mg     Laterality: Right  Location/Site:  T7-8  Needle: 3.5 in., 20 ga. Tuohy  Needle Placement: Paramedian epidural  Findings:   -Comments: Biplanar imaging and LOR confirmed placement  Procedure Details: The fluoroscope was aligned to square off the endplates at the level noted above.  The target area located is the inferior or caudal portion of the vertebral body which is essentially the superior lamina of the level indicated and the overlying structure was infiltrated with 2 to 3 mL of 1% lidocaine  without epinephrine  and a 22-gauge spinal needle was introduced down to the lamina until contact with bone.  This needle was utilized as a locating needle.  The fluoroscope was subsequently tilted in a caudal direction to achieve the most open interlaminar spacing noted.  Using a paramedian approach from the side mentioned above, the region overlying the interlaminar space was localized under fluoroscopic visualization and the soft tissues overlying this structure were infiltrated with 4 ml. of 1% Lidocaine  without Epinephrine . The Tuohy needle was inserted into the epidural space using a paramedian approach and biplanar fluoroscopy.   The epidural space was localized using loss of resistance along  with lateral or counter oblique and bi-planar fluoroscopic views.  After negative aspirate for air, blood, and CSF, the injectate was administered into the level noted above. No contrast due to allergy.   Additional Comments:  The patient tolerated the procedure well Dressing: 2 x 2 sterile gauze and Band-Aid    Post-procedure details: Patient was observed during the procedure. Post-procedure instructions were reviewed.  Patient left the clinic in stable condition.

## 2023-11-29 NOTE — Progress Notes (Signed)
 Vanessa Mcconnell - 63 y.o. female MRN [id number]  Date of birth: 06-03-1961  Office Visit Note: Visit Date: 11/11/2023 PCP: Orlena Bitters, MD Referred by: Diedra Fowler, MD  Subjective: Chief Complaint  Patient presents with   Middle Back - Pain   HPI:  Vanessa Mcconnell is a 63 y.o. female who comes in today at the request of Dr. Colette Davies for planned Right T7-8 Thoracic Interlaminar epidural steroid injection with fluoroscopic guidance.  The patient has failed conservative care including home exercise, medications, time and activity modification.  This injection will be diagnostic and hopefully therapeutic.  Please see requesting physician notes for further details and justification. Patient does have allergy to contrast dye and benadryl . Depending on results could look at transforaminal approach with MRI contrast.    ROS Otherwise per HPI.  Assessment & Plan: Visit Diagnoses:    ICD-10-CM   1. Thoracic radiculopathy  M54.14 XR C-ARM NO REPORT    Epidural Steroid injection    methylPREDNISolone  acetate (DEPO-MEDROL ) injection 40 mg      Plan: No additional findings.   Meds & Orders:  Meds ordered this encounter  Medications   methylPREDNISolone  acetate (DEPO-MEDROL ) injection 40 mg    Orders Placed This Encounter  Procedures   XR C-ARM NO REPORT   Epidural Steroid injection    Follow-up: No follow-ups on file.   Procedures: No procedures performed  Thoracic epidural Steroid Injection - Interlaminar Approach with Fluoroscopic Guidance  Patient: Vanessa Mcconnell      Date of Birth: 14-Mar-1961 MRN: [id number] PCP: Orlena Bitters, MD      Visit Date: 11/11/2023   Universal Protocol:     Consent Given By: the patient  Position: PRONE  Additional Comments: Vital signs were monitored before and after the procedure. Patient was prepped and draped in the usual sterile fashion. The correct patient, procedure, and site was verified.   Injection  Procedure Details:   Procedure diagnoses:  1. Thoracic radiculopathy      Meds Administered:  Meds ordered this encounter  Medications   methylPREDNISolone  acetate (DEPO-MEDROL ) injection 40 mg     Laterality: Right  Location/Site:  T7-8  Needle: 3.5 in., 20 ga. Tuohy  Needle Placement: Paramedian epidural  Findings:   -Comments: Biplanar imaging and LOR confirmed placement  Procedure Details: The fluoroscope was aligned to square off the endplates at the level noted above.  The target area located is the inferior or caudal portion of the vertebral body which is essentially the superior lamina of the level indicated and the overlying structure was infiltrated with 2 to 3 mL of 1% lidocaine  without epinephrine  and a 22-gauge spinal needle was introduced down to the lamina until contact with bone.  This needle was utilized as a locating needle.  The fluoroscope was subsequently tilted in a caudal direction to achieve the most open interlaminar spacing noted.  Using a paramedian approach from the side mentioned above, the region overlying the interlaminar space was localized under fluoroscopic visualization and the soft tissues overlying this structure were infiltrated with 4 ml. of 1% Lidocaine  without Epinephrine . The Tuohy needle was inserted into the epidural space using a paramedian approach and biplanar fluoroscopy.   The epidural space was localized using loss of resistance along with lateral or counter oblique and bi-planar fluoroscopic views.  After negative aspirate for air, blood, and CSF, the injectate was administered into the level noted above. No contrast due to allergy.   Additional Comments:  The patient tolerated the procedure well Dressing: 2 x 2 sterile gauze and Band-Aid    Post-procedure details: Patient was observed during the procedure. Post-procedure instructions were reviewed.  Patient left the clinic in stable condition.   Clinical History: MRI  THORACIC SPINE WITHOUT CONTRAST   TECHNIQUE: Multiplanar, multisequence MR imaging of the thoracic spine was performed. No intravenous contrast was administered.   COMPARISON:  None Available.   FINDINGS: Alignment:  Minimally prominent kyphotic curvature.  No listhesis.   Vertebrae: No evidence of compression fracture or focal bone lesion of significance. Few scattered insignificant hemangiomas.   Cord:  No cord compression or focal cord lesion.   Paraspinal and other soft tissues: Negative   Disc levels:   C7-T1 and T1-2: Normal   T2-3 through T4-5: Minimal disc bulges.  No stenosis.   T5-6: Shallow disc protrusion in the midline indents the ventral subarachnoid space but does not appear to cause neural compression. 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 Mild right foraminal stenosis.   T6-7: Right paracentral disc herniation with slight upward migration. Slight indentation of the ventral subarachnoid space but no apparent neural compression. 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   T7-8: Right paracentral disc herniation indents the ventral subarachnoid space but does not cause visible neural compression. Foramina widely patent.   T8-9: Small central to slightly left-sided disc herniation indents the ventral subarachnoid space but does not appear to cause neural compression. Foramina widely patent.   T9-10: Bulging of the disc more towards the left. No compressive stenosis.   T10-11: Small left paracentral disc herniation indents the ventral subarachnoid space but does not cause neural compression.   T11-12: Tiny right paracentral disc herniation but no compressive effect upon the neural structures.   T12-L1: Normal.   IMPRESSION: 1. 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 These could certainly relate to back pain. There is some foraminal narrowing on the right at T5-6 and T6-7 due  to encroachment by facet osteophytes, but definite neural compression is not identified.     Electronically Signed   By: Bettylou Brunner M.D.   On: 09/06/2023 16:12     Objective:  VS:  HT:    WT:   BMI:     BP:(!) 141/91  HR:69bpm  TEMP: ( )  RESP:  Physical Exam Vitals and nursing note reviewed.  Constitutional:      General: She is not in acute distress.    Appearance: Normal appearance. She is not ill-appearing.  HENT:     Head: Normocephalic and atraumatic.     Right Ear: External ear normal.     Left Ear: External ear normal.  Eyes:     Extraocular Movements: Extraocular movements intact.  Cardiovascular:     Rate and Rhythm: Normal rate.     Pulses: Normal pulses.  Pulmonary:     Effort: Pulmonary effort is normal. No respiratory distress.  Abdominal:     General: There is no distension.     Palpations: Abdomen is soft.  Musculoskeletal:        General: Tenderness present.     Cervical back: Neck supple.     Right lower leg: No edema.     Left lower leg: No edema.     Comments: Patient has good distal strength with no pain over the greater trochanters.  No clonus or focal weakness.  Skin:    Findings: No erythema, lesion or rash.  Neurological:  General: No focal deficit present.     Mental Status: She is alert and oriented to person, place, and time.     Sensory: No sensory deficit.     Motor: No weakness or abnormal muscle tone.     Coordination: Coordination normal.  Psychiatric:        Mood and Affect: Mood normal.        Behavior: Behavior normal.      Imaging: No results found.

## 2023-12-16 ENCOUNTER — Other Ambulatory Visit: Payer: Self-pay | Admitting: Neurology

## 2023-12-16 MED ORDER — LEVETIRACETAM ER 750 MG PO TB24: ORAL_TABLET | ORAL | 3 refills | Status: DC

## 2023-12-16 NOTE — Telephone Encounter (Addendum)
 Last seen on 11/25/22 Follow up scheduled on 05/03/24   Per note on 11/25/22     Left message for patient to call to confirm she is taking as noted by Odie Benne.

## 2023-12-16 NOTE — Telephone Encounter (Signed)
 Pt has returned call to [place], [place]

## 2023-12-16 NOTE — Telephone Encounter (Signed)
 Pt called back stating she is taking 3 tablets at bedtime as noted by Odie Benne. Pt said the pharmacy keeps refilling the 1 po BID.   Rx sent as noted 3 tablets daily at bedtime.

## 2023-12-24 ENCOUNTER — Encounter: Payer: Self-pay | Admitting: Cardiovascular Disease

## 2024-01-11 ENCOUNTER — Other Ambulatory Visit: Payer: Self-pay | Admitting: Neurology

## 2024-01-11 ENCOUNTER — Other Ambulatory Visit: Payer: Self-pay

## 2024-01-11 DIAGNOSIS — Z79899 Other long term (current) drug therapy: Secondary | ICD-10-CM | POA: Diagnosis not present

## 2024-01-11 DIAGNOSIS — Z0000 Encounter for general adult medical examination without abnormal findings: Secondary | ICD-10-CM | POA: Diagnosis not present

## 2024-01-11 DIAGNOSIS — R5383 Other fatigue: Secondary | ICD-10-CM | POA: Diagnosis not present

## 2024-01-11 DIAGNOSIS — Z299 Encounter for prophylactic measures, unspecified: Secondary | ICD-10-CM | POA: Diagnosis not present

## 2024-01-11 DIAGNOSIS — I10 Essential (primary) hypertension: Secondary | ICD-10-CM | POA: Diagnosis not present

## 2024-01-11 DIAGNOSIS — E7800 Pure hypercholesterolemia, unspecified: Secondary | ICD-10-CM | POA: Diagnosis not present

## 2024-01-15 DIAGNOSIS — R030 Elevated blood-pressure reading, without diagnosis of hypertension: Secondary | ICD-10-CM | POA: Diagnosis not present

## 2024-01-15 DIAGNOSIS — E669 Obesity, unspecified: Secondary | ICD-10-CM | POA: Diagnosis not present

## 2024-01-15 DIAGNOSIS — N390 Urinary tract infection, site not specified: Secondary | ICD-10-CM | POA: Diagnosis not present

## 2024-01-15 DIAGNOSIS — Z6832 Body mass index (BMI) 32.0-32.9, adult: Secondary | ICD-10-CM | POA: Diagnosis not present

## 2024-01-19 ENCOUNTER — Other Ambulatory Visit (HOSPITAL_COMMUNITY): Payer: Self-pay | Admitting: Nurse Practitioner

## 2024-01-19 ENCOUNTER — Ambulatory Visit (HOSPITAL_COMMUNITY)
Admission: RE | Admit: 2024-01-19 | Discharge: 2024-01-19 | Disposition: A | Discharge status: Home / Self Care | Source: Ambulatory Visit | Attending: Nurse Practitioner | Admitting: Nurse Practitioner

## 2024-01-19 ENCOUNTER — Other Ambulatory Visit: Payer: Self-pay

## 2024-01-19 DIAGNOSIS — E7800 Pure hypercholesterolemia, unspecified: Secondary | ICD-10-CM | POA: Diagnosis not present

## 2024-01-19 DIAGNOSIS — I10 Essential (primary) hypertension: Secondary | ICD-10-CM | POA: Diagnosis not present

## 2024-01-19 DIAGNOSIS — K449 Diaphragmatic hernia without obstruction or gangrene: Secondary | ICD-10-CM | POA: Diagnosis not present

## 2024-01-19 DIAGNOSIS — R1031 Right lower quadrant pain: Secondary | ICD-10-CM | POA: Insufficient documentation

## 2024-01-19 DIAGNOSIS — Z299 Encounter for prophylactic measures, unspecified: Secondary | ICD-10-CM | POA: Diagnosis not present

## 2024-01-19 DIAGNOSIS — K5730 Diverticulosis of large intestine without perforation or abscess without bleeding: Secondary | ICD-10-CM | POA: Diagnosis not present

## 2024-01-19 DIAGNOSIS — R109 Unspecified abdominal pain: Secondary | ICD-10-CM | POA: Diagnosis not present

## 2024-01-19 DIAGNOSIS — R350 Frequency of micturition: Secondary | ICD-10-CM | POA: Diagnosis not present

## 2024-01-19 NOTE — Telephone Encounter (Signed)
 Jennifer @ [facility] inquiried about pt being able to get a refill on her DULoxetine  , phone rep was advised by CMA that she got it 01/06/24 and a 28 day supply she shouldn't be out yet and that she should run out 02/03/24.  This was relayed to Jennifer @ [facility], she will f/u with pt

## 2024-01-27 ENCOUNTER — Other Ambulatory Visit: Payer: Self-pay

## 2024-01-27 ENCOUNTER — Telehealth: Payer: Self-pay | Admitting: Neurology

## 2024-01-27 DIAGNOSIS — Z299 Encounter for prophylactic measures, unspecified: Secondary | ICD-10-CM | POA: Diagnosis not present

## 2024-01-27 DIAGNOSIS — R350 Frequency of micturition: Secondary | ICD-10-CM | POA: Diagnosis not present

## 2024-01-27 DIAGNOSIS — R52 Pain, unspecified: Secondary | ICD-10-CM | POA: Diagnosis not present

## 2024-01-27 DIAGNOSIS — I10 Essential (primary) hypertension: Secondary | ICD-10-CM | POA: Diagnosis not present

## 2024-01-27 DIAGNOSIS — R3915 Urgency of urination: Secondary | ICD-10-CM | POA: Diagnosis not present

## 2024-01-27 MED ORDER — DULOXETINE HCL 60 MG PO CPEP: 60.0000 mg | ORAL_CAPSULE | Freq: Every day | ORAL | 0 refills | Status: DC

## 2024-01-27 NOTE — Telephone Encounter (Signed)
 Spoke w/ Briant. She e-scribed refill. Pt has upcoming appt.

## 2024-01-27 NOTE — Telephone Encounter (Signed)
 Refill Request sent to Dr. Vear to fill

## 2024-01-27 NOTE — Telephone Encounter (Signed)
 Pharmacy called requesting a refill for the pt's DULoxetine  (CYMBALTA ) 60 MG capsule  Please advise.

## 2024-02-01 DIAGNOSIS — S9032XA Contusion of left foot, initial encounter: Secondary | ICD-10-CM | POA: Diagnosis not present

## 2024-02-02 ENCOUNTER — Other Ambulatory Visit: Payer: Self-pay | Admitting: Family Medicine

## 2024-02-02 ENCOUNTER — Ambulatory Visit (INDEPENDENT_AMBULATORY_CARE_PROVIDER_SITE_OTHER): Admitting: Family Medicine

## 2024-02-02 ENCOUNTER — Encounter: Payer: Self-pay | Admitting: Family Medicine

## 2024-02-02 VITALS — BP 124/69 | HR 64 | Ht 66.0 in | Wt 176.0 lb

## 2024-02-02 DIAGNOSIS — M797 Fibromyalgia: Secondary | ICD-10-CM

## 2024-02-02 DIAGNOSIS — F418 Other specified anxiety disorders: Secondary | ICD-10-CM | POA: Diagnosis not present

## 2024-02-02 DIAGNOSIS — R404 Transient alteration of awareness: Secondary | ICD-10-CM

## 2024-02-02 DIAGNOSIS — R200 Anesthesia of skin: Secondary | ICD-10-CM

## 2024-02-02 DIAGNOSIS — R9082 White matter disease, unspecified: Secondary | ICD-10-CM | POA: Diagnosis not present

## 2024-02-02 DIAGNOSIS — R2681 Unsteadiness on feet: Secondary | ICD-10-CM | POA: Diagnosis not present

## 2024-02-02 MED ORDER — LEVETIRACETAM ER 750 MG PO TB24: 2250.0000 mg | ORAL_TABLET | Freq: Every day | ORAL | 3 refills | Status: DC

## 2024-02-02 MED ORDER — GABAPENTIN 300 MG PO CAPS: ORAL_CAPSULE | ORAL | 3 refills | Status: AC

## 2024-02-02 MED ORDER — DULOXETINE HCL 60 MG PO CPEP: 60.0000 mg | ORAL_CAPSULE | Freq: Every day | ORAL | 3 refills | Status: AC

## 2024-02-02 MED ORDER — TIZANIDINE HCL 4 MG PO TABS: 4.0000 mg | ORAL_TABLET | Freq: Two times a day (BID) | ORAL | 11 refills | Status: AC | PRN

## 2024-02-02 NOTE — Telephone Encounter (Signed)
 I called pharmacy to check on Rx received message stating Pharmacy comment: Alternative Requested:INS MAX 2 CAPS PER DAY.   Rx was only written for 1 po daily. Pharmacist said that was an error someone run Rx for 30 day supply to take TID.   Pharmacist re-ran Rx and it was approved for 1 po daily #90 tablets.

## 2024-02-02 NOTE — Progress Notes (Signed)
 Chief Complaint  Patient presents with   Follow-up    Pt in room 1.alone. Here for fibromyalgia follow up. Pt fell yesterday and broke her toes at [facility]. Pt seening Dr.Moore (Spine DM) for neck pain competed physical therapy.    HISTORY OF PRESENT ILLNESS:  02/02/24 ALL:  Vanessa Mcconnell is a 63 y.o. female here today for follow up for fibromyalgia, transient altered awareness, neck pain and gait abnormalities.   She had a period of a couple mouths where she was sick with multiple respiratory infections. Most recently battling a UTI. Fell yesterday and broke multiple toes on left foot. She is in a walking boot for the next two weeks.   She is seeing Dr Georgina, ortho, for neck pain. She is getting ESIs that help some. She completed PT.   She continues duloxetine  60mg  daily and gabapentin  up to 1500mg  daily and tizanidine  4mg  up to twice daily for pain control. Pain is well managed at time. She was going to the gym 3-4 times a week until breaking foot yesterday.   She continues levetiracetam  XR 2250mg  daily. She is tolerating it well. She reports no recent altered awareness. Recently had CPE with PCP. Labs reportedly normal.    HISTORY (copied from Dr Duncan previous note)  Vanessa Mcconnell is a 63 y.o. woman with white matter changes on MRI  seizures   Update 11/24/2022 Since the last visit we checked MRI of the cervical spine due to her mild gait disturbance and increased reflexes..  It showed degenerative changes at C5-C6 and C6-C7 but no spinal cord compression or nerve root compression.   Since he last visit, she has had 2 falls, one on the grass and just fell   She reports a couple more spells of altered awareness lasting a couple minutes and sometimes  30 minutes.   She is able to hear what her husband is saying but is less interactive.   She does not speak during the episode.     She has continued pain throughout her body, in left side > right.  She gets tingling  and numbness in her hands at times.   She is on duloxetine  and gabapentin  for fibromyalgia pain.  She also takes tizanidine  for muscle spasms but she takes as needed as it makes her sleepy.   She has mild depression and anxiety.    H/o episodes transient alteration of awareness She  has had multiple spells of altered awareness.  Both of her parents died 2021/09/09.  While at a service she fell and broke the right hand and wrist and separated the shoulder and had rib fractures.  She had a brief LOC lasting a couple minutes.  She just recalls coming to and felt groggy that day.    She was casted and had another fell 2 months later.   She has had 2 more falls.     Last week after the fall she had severe neck pain and reduced ROM.   Her left leg is giving out.     She called [facility] and was referred back here.      Seizures were considered and she had an EEG reportedly showing findings consistent with partial complex seizures.   We do not have the results (performed in 2022 at [facility]).   It was done due to spells of altered awareness starting with lightheadedness/dizziness.  She could feel these come on and the spells would last a minute or  so..   If talking to someone she would not respond and she could not hear them.   She would come back to herself quickly.   Spells at night would last longer and she felt confusion.    She was placed on Keppra  ER 2250 mg po qHS.       Reduced focus/abnormal brain MRI I previously saw her for episodes of reduced focus in 2022.   She had an abnormal MRI shows nonspecific foci predominantly in subcortical white matter -these are much more consistent with chronic microvascular ischemic change than demyelination.   No definite change compared to 2019.   MRI cervical spine 09/12/2020 showed a normal spinal cord.  She had a left paramedian protrusion at C5C6 causing mild spinal stenosis.   She also saw Dr. Skeet who checked an MRI of the thoracic spine (spinal  cord normla has some protrusions to the right at T6T7 and T7T8, left at T9T10 and T10T11 but no nerve root compression or significant spinal stenosis).   She has had a lumbar puncture 11/22/2020.   IgG index was normal.   She had no oligoclonal bands (3 paired bands in CSF/serum usually just incidental finding).   She was referred to me and I felt MS was very unlikely with MRI appearance and CSF results.  She most likely had  mild chronic microvascular ischemic change or sequela of migraine headaches  Due to numbness and dysesthesia I checked neuropathy labs (normal)    Vascular risks:   She has HTN but no smoking history or DM.   She has tachycardia on metoprolol    FH:  Mother and 2 cousins had MS   Imaging: MRI of the cervical spine 06/29/2022 showed a normal spinal cord.  At C4-5: Small central disc protrusion indents the thecal sac slightly but does not affect the cord or show foraminal extension.  At C5-6: Endplate osteophytes and bulging of the disc more prominent towards the left. Narrowing of the ventral subarachnoid space but no compressive effect upon the cord or foraminal extension.  Stable compared to 2022.   MRI of the brain 02/23/2022 shows a small number of small T2/FLAIR hyperintense foci predominantly in the subcortical and deep white matter of the frontal lobes.  None of these appear to be acute.  They do not enhance.  This is unchanged compared to the MRI from 09/12/2020   MRI of the thoracic spine 11/13/2020 shows a normal spinal cord.  There areprotrusions to the right at T6T7 and T7T8, left at T9T10 and T10T11 but no nerve root compression or significant spinal stenosis .   MR angiogram of the head 11/04/2020 was normal   MRI of the brain 09/12/2020 shows scattered T2/FLAIR hyperintense foci in the subcortical and deep white matter.  The pattern is nonspecific and more c/w cronic microvascular ischemic change, unlikely to be demyelination   MRI of the cervical spine 09/12/2020 shows left  paramedian disc disc protrusion at C5-C6 causing mild spinal stenosis but no nerve root compression   Labs: CSF 11/22/2020 showed 3 identical gamma restriction bands in the CSF and serum.  IgG index was normal.  Paired bands in the CSF and serum is usually just an incidental finding.   Labs 01/06/2021 showed normal B12, SSA/SSB and unremarkable SPEP/IEF   REVIEW OF SYSTEMS: Out of a complete 14 system review of symptoms, the patient complains only of the following symptoms, neck pain, and all other reviewed systems are negative.   ALLERGIES: Allergies  Allergen  Reactions   Anesthesia S-I-40 [Propofol]    Ivp Dye [Iodinated Contrast Media] Anaphylaxis and Rash   Benadryl  [Diphenhydramine  Hcl] Hives and Other (See Comments)    Passed out   Erythromycin Hives   Iohexol      Code: HIVES, Desc: Anaphylaxis/Hives, Onset Date: [id number]    Latex Rash   Penicillins Swelling and Rash    Has patient had a PCN reaction causing immediate rash, facial/tongue/throat swelling, SOB or lightheadedness with hypotension: Yes Has patient had a PCN reaction causing severe rash involving mucus membranes or skin necrosis: No Has patient had a PCN reaction that required hospitalization: No Has patient had a PCN reaction occurring within the last 10 years: No If all of the above answers are NO, then may proceed with Cephalosporin use.   Tetracyclines & Related Rash     HOME MEDICATIONS: Outpatient Medications Prior to Visit  Medication Sig Dispense Refill   albuterol  (PROVENTIL  HFA;VENTOLIN  HFA) 108 (90 Base) MCG/ACT inhaler Inhale into the lungs every 6 (six) hours as needed for wheezing or shortness of breath.     alendronate  (FOSAMAX ) 70 MG tablet Take 70 mg by mouth once a week.     amLODipine  (NORVASC ) 5 MG tablet Take 1 tablet (5 mg total) by mouth daily. 90 tablet 3   budesonide -formoterol  (SYMBICORT ) 160-4.5 MCG/ACT inhaler Take 2 puffs first thing in am and then another 2 puffs about 12 hours  later. 10.2 g 11   Cholecalciferol (VITAMIN D3) 125 MCG (5000 UT) TABS Take by mouth. Per patient taking with Calcium     famotidine  (PEPCID ) 20 MG tablet One after supper 90 tablet 11   fluticasone (FLONASE) 50 MCG/ACT nasal spray as needed.     hydrochlorothiazide  (MICROZIDE ) 12.5 MG capsule Take 12.5 mg by mouth daily.     meclizine (ANTIVERT) 25 MG tablet Take 25 mg by mouth 3 (three) times daily as needed.     meloxicam (MOBIC) 15 MG tablet Take 15 mg by mouth daily.     metoprolol  succinate (TOPROL -XL) 25 MG 24 hr tablet Take 25 mg by mouth 2 (two) times daily.  5   ondansetron  (ZOFRAN ) 4 MG tablet Take 4 mg by mouth as needed.     pantoprazole  (PROTONIX ) 40 MG tablet Take 1 tablet (40 mg total) by mouth daily. 30 minutes before breakfast 90 tablet 3   potassium chloride  (MICRO-K ) 10 MEQ CR capsule Take 10 mEq by mouth daily.     prazosin (MINIPRESS) 2 MG capsule Take 2 mg by mouth at bedtime and may repeat dose one time if needed.     rosuvastatin (CRESTOR) 5 MG tablet Take 5 mg by mouth once a week.     vitamin B-12 (CYANOCOBALAMIN) 100 MCG tablet Take 100 mcg by mouth daily. Per patient taking a gummie     vitamin C (ASCORBIC ACID) 500 MG tablet Take 500 mg by mouth daily.     zinc gluconate 50 MG tablet Take 50 mg by mouth daily.     DULoxetine  (CYMBALTA ) 60 MG capsule Take 1 capsule (60 mg total) by mouth at bedtime. 30 capsule 0   gabapentin  (NEURONTIN ) 300 MG capsule One po qAM, one po qNoon, one po qEvening and two po qhs 150 capsule 11   levETIRAcetam  (KEPPRA  XR) 750 MG 24 hr tablet Take 3 tablets (2,250 mg total) by mouth daily. 90 tablet 3   tiZANidine  (ZANAFLEX ) 4 MG tablet Take 1 tablet (4 mg total) by mouth 2 (two) times daily as  needed for muscle spasms. 60 tablet 11   levETIRAcetam  (KEPPRA  XR) 750 MG 24 hr tablet Take 2 tablets by mouth at bedtime, 9pm (Patient not taking: Reported on 02/02/2024) 60 tablet 0   No facility-administered medications prior to visit.      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11 with tonsillectomy   MS (multiple sclerosis) (HCC)    MVP (mitral valve prolapse)    Osteoporosis    Pancreatitis    Reportedly single episode in childhood   Pneumonia    Small intestinal bacterial overgrowth 12/2015   Positive breath test   Thoracic disc herniation      PAST SURGICAL HISTORY: Past Surgical History:  Procedure Laterality Date   ABDOMINAL HYSTERECTOMY     BREAST BIOPSY Left 1990's   3 areas biopsied at different times, all with needle not excisional   CATARACT EXTRACTION W/PHACO Left 09/17/2023   Procedure: PHACOEMULSIFICATION, CATARACT, WITH IOL INSERTION;  Surgeon: Harrie Agent, MD;  Location: [facility];  Service: Ophthalmology;  Laterality: Left;  CDE: 6.03   CATARACT EXTRACTION W/PHACO Right 10/15/2023   Procedure: PHACOEMULSIFICATION, CATARACT, WITH IOL INSERTION;  Surgeon: Harrie Agent, MD;  Location: [facility];  Service: Ophthalmology;  Laterality: Right;  CDE: 9.02   COLONOSCOPY N/A 02/11/2015   DOQ:fnizmjuz diverticulosis in the sigmoid colon/moderate internal hemorrhoids   DEBRIDEMENT LEG     cellulitis debridement as a teen   ESOPHAGEAL DILATION  02/11/2015   Procedure: ESOPHAGEAL DILATION;  Surgeon: Margo LITTIE Haddock, MD;  Location: [facility];  Service: Endoscopy;;   ESOPHAGOGASTRODUODENOSCOPY N/A 02/11/2015   DOQ:fnizmjuz size HH/mild non-erosive gastritis   strep infection     causing skin infection, debridement buttocks, hip, legs   TONSILLECTOMY     ULNER NERVE SURGERY     Right Elbow     FAMILY HISTORY: Family History  Problem Relation Age of Onset   Hypertension Mother    Diabetes Father    Heart attack Father        CABG X'3 & stent placement   Hypertension Father     Epilepsy Sister    Supraventricular tachycardia Sister    Other Sister        hole in heart   Heart attack Maternal Grandfather    Peripheral Artery Disease Sister    CAD Sister    Congestive Heart Failure Sister        mild diastolic heart failure   Heart disease Son    Heart disease Son    Colon cancer Neg Hx    Breast cancer Neg Hx      SOCIAL HISTORY: Social History   Socioeconomic History   Marital status: Married    Spouse name: Not on file   Number of children: Not on file   Years of education: Not on file   Highest education level: Not on file  Occupational History   Not on file  Tobacco Use   Smoking status: Never   Smokeless tobacco: Never   Tobacco comments:    Never smoked  Vaping Use   Vaping status: Never Used  Substance and Sexual Activity   Alcohol  use: No    Alcohol /week: 0.0 standard drinks of alcohol   Drug use: No   Sexual activity: Not on file  Other Topics Concern   Not on file  Social History Narrative   Right handed   Caffeine use: daily   Social Drivers of Health   Financial Resource Strain: Not on file  Food Insecurity: Not on file  Transportation Needs: Not on file  Physical Activity: Not on file  Stress: Not on file  Social Connections: Not on file  Intimate Partner Violence: Not on file     PHYSICAL EXAM  Vitals:   02/02/24 1256  BP: 124/69  Pulse: 64  Weight: 176 lb (79.8 kg)  Height: 5' 6 (1.676 m)   Body mass index is 28.41 kg/m.  Generalized: Well developed, in no acute distress  Cardiology: normal rate and rhythm, no murmur auscultated  Respiratory: clear to auscultation bilaterally    Neurological examination  Mentation: Alert oriented to time, place, history taking. Follows all commands speech and language fluent Cranial nerve II-XII: Pupils were equal round reactive to light. Extraocular movements were full, visual field were full on confrontational test. Facial sensation and strength were normal.  Uvula tongue midline. Head turning and shoulder shrug  were normal and symmetric. Motor: The motor testing reveals 5 over 5 strength of all 4 extremities. Good symmetric motor tone is noted throughout.   Gait and station: Gait is arthritic   DIAGNOSTIC DATA (LABS, IMAGING, TESTING) - I reviewed patient records, labs, notes, testing and imaging myself where available.  Lab Results  Component Value Date   WBC 5.8 06/15/2023   HGB 15.2 06/15/2023   HCT 45.8 06/15/2023   MCV 92 06/15/2023   PLT 398 06/15/2023      Component Value Date/Time   NA 135 08/06/2022 0827   K 4.9 08/06/2022 0827   CL 97 08/06/2022 0827   CO2 21 08/06/2022 0827   GLUCOSE 87 08/06/2022 0827   GLUCOSE 120 (H) 08/27/2017 0840   BUN 12 08/06/2022 0827   CREATININE 0.92 08/06/2022 0827   CREATININE 0.64 02/05/2015 0941   CALCIUM 9.8 08/06/2022 0827   PROT 7.0 01/06/2021 1126   ALBUMIN 4.5 11/22/2020 1137   AST 16 08/26/2017 1453   ALT 17 08/26/2017 1453   ALKPHOS 125 08/26/2017 1453   BILITOT 0.7 08/26/2017 1453   GFRNONAA >60 08/27/2017 0840   GFRAA >60 08/27/2017 0840   No results found for: CHOL, HDL, LDLCALC, LDLDIRECT, TRIG, CHOLHDL No results found for: YHAJ8R Lab Results  Component Value Date   VITAMINB12 643 01/06/2021   Lab Results  Component Value Date   TSH 1.54 12/26/2015        No data to display               No data to display           ASSESSMENT AND PLAN  63 y.o. year old female  has a past medical history of Arthritis, Asthma, COPD (chronic obstructive pulmonary disease) (HCC), Essential hypertension, Fibromyalgia, History of migraine, Hyperlipidemia, Malignant hyperthermia, MS (multiple sclerosis) (HCC), MVP (mitral valve prolapse), Osteoporosis, Pancreatitis, Pneumonia, Small intestinal bacterial overgrowth (12/2015), and Thoracic disc herniation. here with    Fibromyalgia  Transient alteration of awareness  White matter abnormality on MRI of  brain  Numbness  Unsteady gait  Depression with anxiety  Vanessa Mcconnell is doing well. We will continue duloxetine  60mg  daily, gabapentin  300mg  TID with 600mg  QHS as well as tizanidine  4mg  BID PRN for pain management. Continue levetiracetam  2250mg  daily for seizure prevention.  Seizure and fall precautions advised. Healthy lifestyle habits encouraged. She will follow up with PCP as directed. She will return to see me in 1 year, sooner if needed. She verbalizes understanding and agreement with this plan.   No orders of the defined types were placed in this encounter.    Meds ordered this encounter  Medications   levETIRAcetam  (KEPPRA  XR) 750 MG 24 hr tablet    Sig: Take 3 tablets (2,250 mg total) by mouth daily. Take 3 tablets (2,250 mg total) by mouth daily.    Dispense:  270 tablet    Refill:  3    Supervising Provider:   AHERN, ANTONIA B [[id number]]   DULoxetine  (CYMBALTA ) 60 MG capsule    Sig: Take 1 capsule (60 mg total) by mouth at bedtime.    Dispense:  90 capsule    Refill:  3    Supervising Provider:   AHERN, ANTONIA B [[id number]]   gabapentin  (NEURONTIN ) 300 MG capsule    Sig: Take 1 capsule (300 mg total) by mouth 3 (three) times daily AND 2 capsules (600 mg total) at bedtime. One po qAM, one po qNoon, one po qEvening and two po qhs.    Dispense:  450 capsule    Refill:  3    Supervising Provider:   AHERN, ANTONIA B [[id number]]   tiZANidine  (ZANAFLEX ) 4 MG tablet    Sig: Take 1 tablet (4 mg total) by mouth 2 (two) times daily as needed for muscle spasms.    Dispense:  60 tablet    Refill:  11    Supervising Provider:   INES ONETHA NOVAK [[id number]]     Greig Forbes, MSN, FNP-C 02/02/2024, 1:24 PM  [facility] [address] [place], [place] [address] [phone]

## 2024-02-02 NOTE — Patient Instructions (Signed)
 Below is our plan:  We will continue current treatment plan  Please make sure you are consistent with timing of seizure medication. I recommend annual visit with primary care provider (PCP) for complete physical and routine blood work. I recommend daily intake of vitamin D (400-800iu) and calcium (800-1000mg ) for bone health. Discuss Dexa screening with PCP.   According to  law, you can not drive unless you are seizure / syncope free for at least 6 months and under physician's care.  Please maintain precautions. Do not participate in activities where a loss of awareness could harm you or someone else. No swimming alone, no tub bathing, no hot tubs, no driving, no operating motorized vehicles (cars, ATVs, motocycles, etc), lawnmowers, power tools or firearms. No standing at heights, such as rooftops, ladders or stairs. Avoid hot objects such as stoves, heaters, open fires. Wear a helmet when riding a bicycle, scooter, skateboard, etc. and avoid areas of traffic. Set your water heater to 120 degrees or less.  SUDEP is the sudden, unexpected death of someone with epilepsy, who was otherwise healthy. In SUDEP cases, no other cause of death is found when an autopsy is done. Each year, more than 1 in 1,000 people with epilepsy die from SUDEP. This is the leading cause of death in people with uncontrolled seizures. Until further answers are available, the best way to prevent SUDEP is to lower your risk by controlling seizures. Research has found that people with all types of epilepsy that experience convulsive seizures can be at risk.  Please make sure you are staying well hydrated. I recommend 50-60 ounces daily. Well balanced diet and regular exercise encouraged. Consistent sleep schedule with 6-8 hours recommended.   Please continue follow up with care team as directed.   Follow up with me in 1 year   You may receive a survey regarding today's visit. I encourage you to leave honest feed back as I do  use this information to improve patient care. Thank you for seeing me today!

## 2024-02-08 ENCOUNTER — Other Ambulatory Visit: Payer: Self-pay | Admitting: Neurology

## 2024-02-27 ENCOUNTER — Other Ambulatory Visit: Payer: Self-pay | Admitting: Neurology

## 2024-02-28 NOTE — Telephone Encounter (Signed)
 Last seen on 02/02/24 Follow up scheduled on 02/07/25

## 2024-03-01 DIAGNOSIS — R030 Elevated blood-pressure reading, without diagnosis of hypertension: Secondary | ICD-10-CM | POA: Diagnosis not present

## 2024-03-01 DIAGNOSIS — Z6832 Body mass index (BMI) 32.0-32.9, adult: Secondary | ICD-10-CM | POA: Diagnosis not present

## 2024-03-01 DIAGNOSIS — S0501XA Injury of conjunctiva and corneal abrasion without foreign body, right eye, initial encounter: Secondary | ICD-10-CM | POA: Diagnosis not present

## 2024-03-01 DIAGNOSIS — E669 Obesity, unspecified: Secondary | ICD-10-CM | POA: Diagnosis not present

## 2024-03-03 ENCOUNTER — Other Ambulatory Visit: Payer: Self-pay | Admitting: Internal Medicine

## 2024-03-03 DIAGNOSIS — Z1231 Encounter for screening mammogram for malignant neoplasm of breast: Secondary | ICD-10-CM

## 2024-03-07 DIAGNOSIS — Z299 Encounter for prophylactic measures, unspecified: Secondary | ICD-10-CM | POA: Diagnosis not present

## 2024-03-07 DIAGNOSIS — I10 Essential (primary) hypertension: Secondary | ICD-10-CM | POA: Diagnosis not present

## 2024-03-07 DIAGNOSIS — J449 Chronic obstructive pulmonary disease, unspecified: Secondary | ICD-10-CM | POA: Diagnosis not present

## 2024-03-07 DIAGNOSIS — L259 Unspecified contact dermatitis, unspecified cause: Secondary | ICD-10-CM | POA: Diagnosis not present

## 2024-03-07 DIAGNOSIS — G35 Multiple sclerosis: Secondary | ICD-10-CM | POA: Diagnosis not present

## 2024-03-14 DIAGNOSIS — I10 Essential (primary) hypertension: Secondary | ICD-10-CM | POA: Diagnosis not present

## 2024-03-14 DIAGNOSIS — Z299 Encounter for prophylactic measures, unspecified: Secondary | ICD-10-CM | POA: Diagnosis not present

## 2024-03-14 DIAGNOSIS — R52 Pain, unspecified: Secondary | ICD-10-CM | POA: Diagnosis not present

## 2024-03-14 DIAGNOSIS — K921 Melena: Secondary | ICD-10-CM | POA: Diagnosis not present

## 2024-03-21 ENCOUNTER — Encounter (INDEPENDENT_AMBULATORY_CARE_PROVIDER_SITE_OTHER): Payer: Self-pay | Admitting: *Deleted

## 2024-03-22 ENCOUNTER — Ambulatory Visit (INDEPENDENT_AMBULATORY_CARE_PROVIDER_SITE_OTHER): Admitting: Gastroenterology

## 2024-03-22 ENCOUNTER — Telehealth (INDEPENDENT_AMBULATORY_CARE_PROVIDER_SITE_OTHER): Payer: Self-pay

## 2024-03-22 ENCOUNTER — Encounter (INDEPENDENT_AMBULATORY_CARE_PROVIDER_SITE_OTHER): Payer: Self-pay | Admitting: Gastroenterology

## 2024-03-22 VITALS — BP 112/72 | HR 78 | Temp 98.2°F | Ht 62.0 in | Wt 179.6 lb

## 2024-03-22 DIAGNOSIS — R197 Diarrhea, unspecified: Secondary | ICD-10-CM | POA: Insufficient documentation

## 2024-03-22 DIAGNOSIS — K625 Hemorrhage of anus and rectum: Secondary | ICD-10-CM | POA: Diagnosis not present

## 2024-03-22 DIAGNOSIS — R1030 Lower abdominal pain, unspecified: Secondary | ICD-10-CM

## 2024-03-22 DIAGNOSIS — R109 Unspecified abdominal pain: Secondary | ICD-10-CM | POA: Diagnosis not present

## 2024-03-22 MED ORDER — PEG 3350-KCL-NA BICARB-NACL 420 G PO SOLR: 4000.0000 mL | Freq: Once | ORAL | 0 refills | Status: AC

## 2024-03-22 MED ORDER — DICYCLOMINE HCL 10 MG PO CAPS: 10.0000 mg | ORAL_CAPSULE | Freq: Two times a day (BID) | ORAL | 0 refills | Status: DC | PRN

## 2024-03-22 NOTE — Progress Notes (Signed)
 Vanessa Mcconnell, M.D. Gastroenterology & Hepatology [facility] [address] [place], [place] [address] Primary Care Physician: Rosamond Leta NOVAK, MD [address] [place] [address]  Referring MD: PCP  Chief Complaint: Rectal bleeding, diarrhea and abdominal pain.  History of Present Illness: Vanessa Mcconnell is a 63 y.o. female with past medical history of asthma, COPD, for myalgia, hypertension, hyperlipidemia, multiple sclerosis, osteoporosis, pancreatitis, small intestinal bacterial overgrowth, who presents for evaluation of rectal bleeding, diarrhea and abdominal pain.  Patient reports that in the last month and half she has presented recurrent episodes of R back pain that migrated to the R flank. For the last week she states her abdominal is present in the lower abdomen. She states that a month and a half ago she was also having diarrhea multiple bowel movements with soft to watery consistency. The diarrhea worsened for the last week.  She also presented significant nausea without vomiting. Patient saw her PCP, who prescribed an antibiotic for 7 days, which did not substantially helped her symptoms. She is now on her second course of antibiotics with ciprofloxacin , which was sent for empiric coverage - possibly for SIBO breath test. She is also presenting frequent bloating.  As part of the workup of her symptoms, the patient had a CT abdomen and pelvis without IV contrast on  01/19/24 which showed diverticulosis but otherwise was normal.  The patient denies having any vomiting, fever, chills, melena, hematemesis,  jaundice, pruritus.  Last EGD: 02/11/2015 Esophagitis with peptic stricture that was patent, moderate size hiatal hernia, nonerosive gastritis.  Biopsies of the stomach showed chronic gastritis negative for H. pylori, normal duodenum. Last Colonoscopy: 02/11/2015 Normal terminal ileum, diverticulosis in sigmoid colon.  Random biopsies from  colon were within normal limits.  Moderate size hemorrhoids.  FHx: neg for any gastrointestinal/liver disease, no malignancies, father had abdominal cancer but unknown location Social: neg smoking, alcohol  or illicit drug use Surgical: no abdominal surgeries  Past Medical History: Past Medical History:  Diagnosis Date   Arthritis    Asthma    COPD (chronic obstructive pulmonary disease) (HCC)    Essential hypertension    Fibromyalgia    History of migraine    Hyperlipidemia    Malignant hyperthermia    Reportedly at age 35 with tonsillectomy   MS (multiple sclerosis) (HCC)    MVP (mitral valve prolapse)    Osteoporosis    Pancreatitis    Reportedly single episode in childhood   Pneumonia    Small intestinal bacterial overgrowth 12/2015   Positive breath test   Thoracic disc herniation     Past Surgical History: Past Surgical History:  Procedure Laterality Date   ABDOMINAL HYSTERECTOMY     BREAST BIOPSY Left 1990's   3 areas biopsied at different times, all with needle not excisional   CATARACT EXTRACTION W/PHACO Left 09/17/2023   Procedure: PHACOEMULSIFICATION, CATARACT, WITH IOL INSERTION;  Surgeon: Harrie Agent, MD;  Location: [facility];  Service: Ophthalmology;  Laterality: Left;  CDE: 6.03   CATARACT EXTRACTION W/PHACO Right 10/15/2023   Procedure: PHACOEMULSIFICATION, CATARACT, WITH IOL INSERTION;  Surgeon: Harrie Agent, MD;  Location: [facility];  Service: Ophthalmology;  Laterality: Right;  CDE: 9.02   COLONOSCOPY N/A 02/11/2015   DOQ:fnizmjuz diverticulosis in the sigmoid colon/moderate internal hemorrhoids   DEBRIDEMENT LEG     cellulitis debridement as a teen   ESOPHAGEAL DILATION  02/11/2015   Procedure: ESOPHAGEAL DILATION;  Surgeon: Margo LITTIE Haddock, MD;  Location: [facility];  Service: Endoscopy;;   ESOPHAGOGASTRODUODENOSCOPY N/A 02/11/2015   DOQ:fnizmjuz size HH/mild non-erosive gastritis   strep infection     causing skin infection, debridement buttocks, hip, legs    TONSILLECTOMY     ULNER NERVE SURGERY     Right Elbow    Family History: Family History  Problem Relation Age of Onset   Hypertension Mother    Diabetes Father    Heart attack Father        CABG X'3 & stent placement   Hypertension Father    Epilepsy Sister    Supraventricular tachycardia Sister    Other Sister        hole in heart   Heart attack Maternal Grandfather    Peripheral Artery Disease Sister    CAD Sister    Congestive Heart Failure Sister        mild diastolic heart failure   Heart disease Son    Heart disease Son    Colon cancer Neg Hx    Breast cancer Neg Hx     Social History: Social History   Tobacco Use  Smoking Status Never  Smokeless Tobacco Never  Tobacco Comments   Never smoked   Social History   Substance and Sexual Activity  Alcohol  Use No   Alcohol /week: 0.0 standard drinks of alcohol    Social History   Substance and Sexual Activity  Drug Use No    Allergies: Allergies  Allergen Reactions   Anesthesia S-I-40 [Propofol]    Ivp Dye [Iodinated Contrast Media] Anaphylaxis and Rash   Benadryl  [Diphenhydramine  Hcl] Hives and Other (See Comments)    Passed out   Erythromycin Hives   Iohexol      Code: HIVES, Desc: Anaphylaxis/Hives, Onset Date: 87817991    Latex Rash   Penicillins Swelling and Rash    Has patient had a PCN reaction causing immediate rash, facial/tongue/throat swelling, SOB or lightheadedness with hypotension: Yes Has patient had a PCN reaction causing severe rash involving mucus membranes or skin necrosis: No Has patient had a PCN reaction that required hospitalization: No Has patient had a PCN reaction occurring within the last 10 years: No If all of the above answers are NO, then may proceed with Cephalosporin use.   Tetracyclines & Related Rash    Medications: Current Outpatient Medications  Medication Sig Dispense Refill   albuterol  (PROVENTIL  HFA;VENTOLIN  HFA) 108 (90 Base) MCG/ACT inhaler Inhale  into the lungs every 6 (six) hours as needed for wheezing or shortness of breath.     alendronate  (FOSAMAX ) 70 MG tablet Take 70 mg by mouth once a week.     amLODipine  (NORVASC ) 5 MG tablet Take 1 tablet (5 mg total) by mouth daily. 90 tablet 3   budesonide -formoterol  (SYMBICORT ) 160-4.5 MCG/ACT inhaler Take 2 puffs first thing in am and then another 2 puffs about 12 hours later. 10.2 g 11   Cholecalciferol (VITAMIN D3) 125 MCG (5000 UT) TABS Take by mouth. Per patient taking with Calcium     DULoxetine  (CYMBALTA ) 60 MG capsule Take 1 capsule (60 mg total) by mouth at bedtime. 90 capsule 3   famotidine  (PEPCID ) 20 MG tablet One after supper 90 tablet 11   fluticasone (FLONASE) 50 MCG/ACT nasal spray as needed.     gabapentin  (NEURONTIN ) 300 MG capsule Take 1 capsule (300 mg total) by mouth 3 (three) times daily AND 2 capsules (600 mg total) at bedtime. One po qAM, one po qNoon, one po qEvening and two po qhs.  450 capsule 3   hydrochlorothiazide  (MICROZIDE ) 12.5 MG capsule Take 12.5 mg by mouth daily.     levETIRAcetam  (KEPPRA  XR) 750 MG 24 hr tablet Take 3 tablets by mouth every day 90 tablet 10   meclizine (ANTIVERT) 25 MG tablet Take 25 mg by mouth 3 (three) times daily as needed.     metoprolol  succinate (TOPROL -XL) 25 MG 24 hr tablet Take 25 mg by mouth 2 (two) times daily.  5   Midazolam , Anticonvulsant, (NAYZILAM  NA) Place into the nose.     ondansetron  (ZOFRAN ) 4 MG tablet Take 4 mg by mouth as needed.     pantoprazole  (PROTONIX ) 40 MG tablet Take 1 tablet (40 mg total) by mouth daily. 30 minutes before breakfast 90 tablet 3   potassium chloride  (MICRO-K ) 10 MEQ CR capsule Take 10 mEq by mouth daily.     prazosin (MINIPRESS) 2 MG capsule Take 2 mg by mouth at bedtime and may repeat dose one time if needed.     rosuvastatin (CRESTOR) 5 MG tablet Take 5 mg by mouth once a week.     tiZANidine  (ZANAFLEX ) 4 MG tablet Take 1 tablet (4 mg total) by mouth 2 (two) times daily as needed for muscle  spasms. 60 tablet 11   vitamin B-12 (CYANOCOBALAMIN) 100 MCG tablet Take 100 mcg by mouth daily. Per patient taking a gummie     vitamin C (ASCORBIC ACID) 500 MG tablet Take 500 mg by mouth daily.     zinc gluconate 50 MG tablet Take 50 mg by mouth daily.     No current facility-administered medications for this visit.    Review of Systems: GENERAL: negative for malaise, night sweats HEENT: No changes in hearing or vision, no nose bleeds or other nasal problems. NECK: Negative for lumps, goiter, pain and significant neck swelling RESPIRATORY: Negative for cough, wheezing CARDIOVASCULAR: Negative for chest pain, leg swelling, palpitations, orthopnea GI: SEE HPI MUSCULOSKELETAL: Negative for joint pain or swelling, back pain, and muscle pain. SKIN: Negative for lesions, rash PSYCH: Negative for sleep disturbance, mood disorder and recent psychosocial stressors. HEMATOLOGY Negative for prolonged bleeding, bruising easily, and swollen nodes. ENDOCRINE: Negative for cold or heat intolerance, polyuria, polydipsia and goiter. NEURO: negative for tremor, gait imbalance, syncope and seizures. The remainder of the review of systems is noncontributory.   Physical Exam: BP 112/72   Pulse 78   Temp 98.2 F (36.8 C)   Ht 5' 2 (1.575 m)   Wt 179 lb 9.6 oz (81.5 kg)   BMI 32.85 kg/m  GENERAL: The patient is AO x3, in no acute distress. HEENT: Head is normocephalic and atraumatic. EOMI are intact. Mouth is well hydrated and without lesions. NECK: Supple. No masses LUNGS: Clear to auscultation. No presence of rhonchi/wheezing/rales. Adequate chest expansion HEART: RRR, normal s1 and s2. ABDOMEN: Mildly tender upon palpation of the lower abdomen, no guarding, no peritoneal signs, and nondistended. BS +. No masses. EXTREMITIES: Without any cyanosis, clubbing, rash, lesions or edema. NEUROLOGIC: AOx3, no focal motor deficit. SKIN: no jaundice, no rashes   Imaging/Labs: as above  I  personally reviewed and interpreted the available labs, imaging and endoscopic files.  Impression and Plan: Vanessa Mcconnell is a 63 y.o. female with past medical history of asthma, COPD, for myalgia, hypertension, hyperlipidemia, multiple sclerosis, osteoporosis, pancreatitis, small intestinal bacterial overgrowth, who presents for evaluation of rectal bleeding, diarrhea and abdominal pain.  Patient has presented persistent gastrointestinal symptoms despite receiving 2 antibiotic courses with minimal improvement.  Cross-sectional abdominal imaging without contrast did not show any acute inflammation or changes that would explain her symptoms.  I discussed with the patient that even though she has received antibiotics, she did not have evidence of diverticulitis.  It is not clear which antibiotic she took first but her current antibiotic regimen is not adequate for management of SIBO as monotherapy.  At this point, given the presence of rectal bleeding and persistent diarrhea we will proceed with a colonoscopy for further evaluation of her symptoms.  Will also check for C. difficile as she has been on antibiotics, which could have affected her microbiota.  Will also send a prescription for Bentyl  as needed to relieve her abdominal pain.  - Check C. difficile in stool -Schedule colonoscopy -Start Bentyl  1 tablet q12h as needed for abdominal pain  All questions were answered.      Vanessa Fortune, MD Gastroenterology and Hepatology [facility]

## 2024-03-22 NOTE — H&P (View-Only) (Signed)
 Toribio Fortune, M.D. Gastroenterology & Hepatology [facility] [address] [place], [place] [address] Primary Care Physician: Rosamond Leta NOVAK, MD [address] [place] [address]  Referring MD: PCP  Chief Complaint: Rectal bleeding, diarrhea and abdominal pain.  History of Present Illness: Vanessa Mcconnell is a 63 y.o. female with past medical history of asthma, COPD, for myalgia, hypertension, hyperlipidemia, multiple sclerosis, osteoporosis, pancreatitis, small intestinal bacterial overgrowth, who presents for evaluation of rectal bleeding, diarrhea and abdominal pain.  Patient reports that in the last month and half she has presented recurrent episodes of R back pain that migrated to the R flank. For the last week she states her abdominal is present in the lower abdomen. She states that a month and a half ago she was also having diarrhea multiple bowel movements with soft to watery consistency. The diarrhea worsened for the last week.  She also presented significant nausea without vomiting. Patient saw her PCP, who prescribed an antibiotic for 7 days, which did not substantially helped her symptoms. She is now on her second course of antibiotics with ciprofloxacin , which was sent for empiric coverage - possibly for SIBO breath test. She is also presenting frequent bloating.  As part of the workup of her symptoms, the patient had a CT abdomen and pelvis without IV contrast on  01/19/24 which showed diverticulosis but otherwise was normal.  The patient denies having any vomiting, fever, chills, melena, hematemesis,  jaundice, pruritus.  Last EGD: 02/11/2015 Esophagitis with peptic stricture that was patent, moderate size hiatal hernia, nonerosive gastritis.  Biopsies of the stomach showed chronic gastritis negative for H. pylori, normal duodenum. Last Colonoscopy: 02/11/2015 Normal terminal ileum, diverticulosis in sigmoid colon.  Random biopsies from  colon were within normal limits.  Moderate size hemorrhoids.  FHx: neg for any gastrointestinal/liver disease, no malignancies, father had abdominal cancer but unknown location Social: neg smoking, alcohol  or illicit drug use Surgical: no abdominal surgeries  Past Medical History: Past Medical History:  Diagnosis Date   Arthritis    Asthma    COPD (chronic obstructive pulmonary disease) (HCC)    Essential hypertension    Fibromyalgia    History of migraine    Hyperlipidemia    Malignant hyperthermia    Reportedly at age 35 with tonsillectomy   MS (multiple sclerosis) (HCC)    MVP (mitral valve prolapse)    Osteoporosis    Pancreatitis    Reportedly single episode in childhood   Pneumonia    Small intestinal bacterial overgrowth 12/2015   Positive breath test   Thoracic disc herniation     Past Surgical History: Past Surgical History:  Procedure Laterality Date   ABDOMINAL HYSTERECTOMY     BREAST BIOPSY Left 1990's   3 areas biopsied at different times, all with needle not excisional   CATARACT EXTRACTION W/PHACO Left 09/17/2023   Procedure: PHACOEMULSIFICATION, CATARACT, WITH IOL INSERTION;  Surgeon: Harrie Agent, MD;  Location: [facility];  Service: Ophthalmology;  Laterality: Left;  CDE: 6.03   CATARACT EXTRACTION W/PHACO Right 10/15/2023   Procedure: PHACOEMULSIFICATION, CATARACT, WITH IOL INSERTION;  Surgeon: Harrie Agent, MD;  Location: [facility];  Service: Ophthalmology;  Laterality: Right;  CDE: 9.02   COLONOSCOPY N/A 02/11/2015   DOQ:fnizmjuz diverticulosis in the sigmoid colon/moderate internal hemorrhoids   DEBRIDEMENT LEG     cellulitis debridement as a teen   ESOPHAGEAL DILATION  02/11/2015   Procedure: ESOPHAGEAL DILATION;  Surgeon: Margo LITTIE Haddock, MD;  Location: [facility];  Service: Endoscopy;;   ESOPHAGOGASTRODUODENOSCOPY N/A 02/11/2015   DOQ:fnizmjuz size HH/mild non-erosive gastritis   strep infection     causing skin infection, debridement buttocks, hip, legs    TONSILLECTOMY     ULNER NERVE SURGERY     Right Elbow    Family History: Family History  Problem Relation Age of Onset   Hypertension Mother    Diabetes Father    Heart attack Father        CABG X'3 & stent placement   Hypertension Father    Epilepsy Sister    Supraventricular tachycardia Sister    Other Sister        hole in heart   Heart attack Maternal Grandfather    Peripheral Artery Disease Sister    CAD Sister    Congestive Heart Failure Sister        mild diastolic heart failure   Heart disease Son    Heart disease Son    Colon cancer Neg Hx    Breast cancer Neg Hx     Social History: Social History   Tobacco Use  Smoking Status Never  Smokeless Tobacco Never  Tobacco Comments   Never smoked   Social History   Substance and Sexual Activity  Alcohol  Use No   Alcohol /week: 0.0 standard drinks of alcohol    Social History   Substance and Sexual Activity  Drug Use No    Allergies: Allergies  Allergen Reactions   Anesthesia S-I-40 [Propofol]    Ivp Dye [Iodinated Contrast Media] Anaphylaxis and Rash   Benadryl  [Diphenhydramine  Hcl] Hives and Other (See Comments)    Passed out   Erythromycin Hives   Iohexol      Code: HIVES, Desc: Anaphylaxis/Hives, Onset Date: 87817991    Latex Rash   Penicillins Swelling and Rash    Has patient had a PCN reaction causing immediate rash, facial/tongue/throat swelling, SOB or lightheadedness with hypotension: Yes Has patient had a PCN reaction causing severe rash involving mucus membranes or skin necrosis: No Has patient had a PCN reaction that required hospitalization: No Has patient had a PCN reaction occurring within the last 10 years: No If all of the above answers are NO, then may proceed with Cephalosporin use.   Tetracyclines & Related Rash    Medications: Current Outpatient Medications  Medication Sig Dispense Refill   albuterol  (PROVENTIL  HFA;VENTOLIN  HFA) 108 (90 Base) MCG/ACT inhaler Inhale  into the lungs every 6 (six) hours as needed for wheezing or shortness of breath.     alendronate  (FOSAMAX ) 70 MG tablet Take 70 mg by mouth once a week.     amLODipine  (NORVASC ) 5 MG tablet Take 1 tablet (5 mg total) by mouth daily. 90 tablet 3   budesonide -formoterol  (SYMBICORT ) 160-4.5 MCG/ACT inhaler Take 2 puffs first thing in am and then another 2 puffs about 12 hours later. 10.2 g 11   Cholecalciferol (VITAMIN D3) 125 MCG (5000 UT) TABS Take by mouth. Per patient taking with Calcium     DULoxetine  (CYMBALTA ) 60 MG capsule Take 1 capsule (60 mg total) by mouth at bedtime. 90 capsule 3   famotidine  (PEPCID ) 20 MG tablet One after supper 90 tablet 11   fluticasone (FLONASE) 50 MCG/ACT nasal spray as needed.     gabapentin  (NEURONTIN ) 300 MG capsule Take 1 capsule (300 mg total) by mouth 3 (three) times daily AND 2 capsules (600 mg total) at bedtime. One po qAM, one po qNoon, one po qEvening and two po qhs.  450 capsule 3   hydrochlorothiazide  (MICROZIDE ) 12.5 MG capsule Take 12.5 mg by mouth daily.     levETIRAcetam  (KEPPRA  XR) 750 MG 24 hr tablet Take 3 tablets by mouth every day 90 tablet 10   meclizine (ANTIVERT) 25 MG tablet Take 25 mg by mouth 3 (three) times daily as needed.     metoprolol  succinate (TOPROL -XL) 25 MG 24 hr tablet Take 25 mg by mouth 2 (two) times daily.  5   Midazolam , Anticonvulsant, (NAYZILAM  NA) Place into the nose.     ondansetron  (ZOFRAN ) 4 MG tablet Take 4 mg by mouth as needed.     pantoprazole  (PROTONIX ) 40 MG tablet Take 1 tablet (40 mg total) by mouth daily. 30 minutes before breakfast 90 tablet 3   potassium chloride  (MICRO-K ) 10 MEQ CR capsule Take 10 mEq by mouth daily.     prazosin (MINIPRESS) 2 MG capsule Take 2 mg by mouth at bedtime and may repeat dose one time if needed.     rosuvastatin (CRESTOR) 5 MG tablet Take 5 mg by mouth once a week.     tiZANidine  (ZANAFLEX ) 4 MG tablet Take 1 tablet (4 mg total) by mouth 2 (two) times daily as needed for muscle  spasms. 60 tablet 11   vitamin B-12 (CYANOCOBALAMIN) 100 MCG tablet Take 100 mcg by mouth daily. Per patient taking a gummie     vitamin C (ASCORBIC ACID) 500 MG tablet Take 500 mg by mouth daily.     zinc gluconate 50 MG tablet Take 50 mg by mouth daily.     No current facility-administered medications for this visit.    Review of Systems: GENERAL: negative for malaise, night sweats HEENT: No changes in hearing or vision, no nose bleeds or other nasal problems. NECK: Negative for lumps, goiter, pain and significant neck swelling RESPIRATORY: Negative for cough, wheezing CARDIOVASCULAR: Negative for chest pain, leg swelling, palpitations, orthopnea GI: SEE HPI MUSCULOSKELETAL: Negative for joint pain or swelling, back pain, and muscle pain. SKIN: Negative for lesions, rash PSYCH: Negative for sleep disturbance, mood disorder and recent psychosocial stressors. HEMATOLOGY Negative for prolonged bleeding, bruising easily, and swollen nodes. ENDOCRINE: Negative for cold or heat intolerance, polyuria, polydipsia and goiter. NEURO: negative for tremor, gait imbalance, syncope and seizures. The remainder of the review of systems is noncontributory.   Physical Exam: BP 112/72   Pulse 78   Temp 98.2 F (36.8 C)   Ht 5' 2 (1.575 m)   Wt 179 lb 9.6 oz (81.5 kg)   BMI 32.85 kg/m  GENERAL: The patient is AO x3, in no acute distress. HEENT: Head is normocephalic and atraumatic. EOMI are intact. Mouth is well hydrated and without lesions. NECK: Supple. No masses LUNGS: Clear to auscultation. No presence of rhonchi/wheezing/rales. Adequate chest expansion HEART: RRR, normal s1 and s2. ABDOMEN: Mildly tender upon palpation of the lower abdomen, no guarding, no peritoneal signs, and nondistended. BS +. No masses. EXTREMITIES: Without any cyanosis, clubbing, rash, lesions or edema. NEUROLOGIC: AOx3, no focal motor deficit. SKIN: no jaundice, no rashes   Imaging/Labs: as above  I  personally reviewed and interpreted the available labs, imaging and endoscopic files.  Impression and Plan: Vanessa Mcconnell is a 63 y.o. female with past medical history of asthma, COPD, for myalgia, hypertension, hyperlipidemia, multiple sclerosis, osteoporosis, pancreatitis, small intestinal bacterial overgrowth, who presents for evaluation of rectal bleeding, diarrhea and abdominal pain.  Patient has presented persistent gastrointestinal symptoms despite receiving 2 antibiotic courses with minimal improvement.  Cross-sectional abdominal imaging without contrast did not show any acute inflammation or changes that would explain her symptoms.  I discussed with the patient that even though she has received antibiotics, she did not have evidence of diverticulitis.  It is not clear which antibiotic she took first but her current antibiotic regimen is not adequate for management of SIBO as monotherapy.  At this point, given the presence of rectal bleeding and persistent diarrhea we will proceed with a colonoscopy for further evaluation of her symptoms.  Will also check for C. difficile as she has been on antibiotics, which could have affected her microbiota.  Will also send a prescription for Bentyl  as needed to relieve her abdominal pain.  - Check C. difficile in stool -Schedule colonoscopy -Start Bentyl  1 tablet q12h as needed for abdominal pain  All questions were answered.      Toribio Fortune, MD Gastroenterology and Hepatology [facility]astroenter[facility]

## 2024-03-22 NOTE — Telephone Encounter (Signed)
 Spoke with patient here in the office, scheduled pt for 03/28/2024 at 2pm. Rx sent to pharmacy, instructions given to patient.

## 2024-03-22 NOTE — Patient Instructions (Signed)
 Perform stool workup Schedule colonoscopy Start Bentyl  1 tablet q12h as needed for abdominal pain

## 2024-03-23 DIAGNOSIS — R1030 Lower abdominal pain, unspecified: Secondary | ICD-10-CM | POA: Diagnosis not present

## 2024-03-24 ENCOUNTER — Encounter (HOSPITAL_COMMUNITY)
Admission: RE | Admit: 2024-03-24 | Discharge: 2024-03-24 | Disposition: A | Discharge status: Home / Self Care | Source: Ambulatory Visit | Attending: Gastroenterology | Admitting: Gastroenterology

## 2024-03-24 ENCOUNTER — Other Ambulatory Visit: Payer: Self-pay

## 2024-03-24 ENCOUNTER — Encounter (HOSPITAL_COMMUNITY): Payer: Self-pay

## 2024-03-24 ENCOUNTER — Ambulatory Visit (INDEPENDENT_AMBULATORY_CARE_PROVIDER_SITE_OTHER): Payer: Self-pay | Admitting: Gastroenterology

## 2024-03-24 VITALS — Ht 62.0 in | Wt 178.0 lb

## 2024-03-24 DIAGNOSIS — Z79899 Other long term (current) drug therapy: Secondary | ICD-10-CM

## 2024-03-24 LAB — C. DIFFICILE GDH AND TOXIN A/B
GDH ANTIGEN: NOT DETECTED
MICRO NUMBER:: 16957536
SPECIMEN QUALITY:: ADEQUATE
TOXIN A AND B: NOT DETECTED

## 2024-03-27 ENCOUNTER — Encounter (HOSPITAL_COMMUNITY): Payer: Self-pay | Admitting: *Deleted

## 2024-03-27 NOTE — Progress Notes (Signed)
 Notified Dr.Kiel of patient's history of malignant hyperthermia. He states okay to proceed

## 2024-03-28 ENCOUNTER — Other Ambulatory Visit: Payer: Self-pay

## 2024-03-28 ENCOUNTER — Encounter (HOSPITAL_COMMUNITY)
Admission: RE | Disposition: A | Discharge status: Home / Self Care | Payer: Self-pay | Source: Home / Self Care | Attending: Gastroenterology

## 2024-03-28 ENCOUNTER — Encounter (HOSPITAL_COMMUNITY): Payer: Self-pay | Admitting: Gastroenterology

## 2024-03-28 ENCOUNTER — Ambulatory Visit (HOSPITAL_BASED_OUTPATIENT_CLINIC_OR_DEPARTMENT_OTHER): Admitting: Anesthesiology

## 2024-03-28 ENCOUNTER — Ambulatory Visit (HOSPITAL_COMMUNITY)
Admission: RE | Admit: 2024-03-28 | Discharge: 2024-03-28 | Disposition: A | Discharge status: Home / Self Care | Attending: Gastroenterology | Admitting: Gastroenterology

## 2024-03-28 ENCOUNTER — Ambulatory Visit (HOSPITAL_COMMUNITY): Admitting: Anesthesiology

## 2024-03-28 DIAGNOSIS — K219 Gastro-esophageal reflux disease without esophagitis: Secondary | ICD-10-CM | POA: Insufficient documentation

## 2024-03-28 DIAGNOSIS — R569 Unspecified convulsions: Secondary | ICD-10-CM | POA: Diagnosis not present

## 2024-03-28 DIAGNOSIS — E785 Hyperlipidemia, unspecified: Secondary | ICD-10-CM | POA: Diagnosis not present

## 2024-03-28 DIAGNOSIS — K2950 Unspecified chronic gastritis without bleeding: Secondary | ICD-10-CM | POA: Diagnosis not present

## 2024-03-28 DIAGNOSIS — J449 Chronic obstructive pulmonary disease, unspecified: Secondary | ICD-10-CM | POA: Insufficient documentation

## 2024-03-28 DIAGNOSIS — K635 Polyp of colon: Secondary | ICD-10-CM

## 2024-03-28 DIAGNOSIS — Z79899 Other long term (current) drug therapy: Secondary | ICD-10-CM | POA: Diagnosis not present

## 2024-03-28 DIAGNOSIS — R197 Diarrhea, unspecified: Secondary | ICD-10-CM | POA: Insufficient documentation

## 2024-03-28 DIAGNOSIS — K625 Hemorrhage of anus and rectum: Secondary | ICD-10-CM | POA: Insufficient documentation

## 2024-03-28 DIAGNOSIS — K5730 Diverticulosis of large intestine without perforation or abscess without bleeding: Secondary | ICD-10-CM | POA: Diagnosis not present

## 2024-03-28 DIAGNOSIS — G35 Multiple sclerosis: Secondary | ICD-10-CM | POA: Diagnosis not present

## 2024-03-28 DIAGNOSIS — Z7951 Long term (current) use of inhaled steroids: Secondary | ICD-10-CM | POA: Insufficient documentation

## 2024-03-28 DIAGNOSIS — I10 Essential (primary) hypertension: Secondary | ICD-10-CM

## 2024-03-28 DIAGNOSIS — Z7983 Long term (current) use of bisphosphonates: Secondary | ICD-10-CM | POA: Insufficient documentation

## 2024-03-28 DIAGNOSIS — G4730 Sleep apnea, unspecified: Secondary | ICD-10-CM | POA: Diagnosis not present

## 2024-03-28 DIAGNOSIS — K648 Other hemorrhoids: Secondary | ICD-10-CM | POA: Insufficient documentation

## 2024-03-28 DIAGNOSIS — G4733 Obstructive sleep apnea (adult) (pediatric): Secondary | ICD-10-CM

## 2024-03-28 HISTORY — PX: COLONOSCOPY: SHX5424

## 2024-03-28 LAB — BASIC METABOLIC PANEL WITH GFR
Anion gap: 14 (ref 5–15)
BUN: 8 mg/dL (ref 8–23)
CO2: 18 mmol/L — ABNORMAL LOW (ref 22–32)
Calcium: 9.3 mg/dL (ref 8.9–10.3)
Chloride: 107 mmol/L (ref 98–111)
Creatinine, Ser: 0.65 mg/dL (ref 0.44–1.00)
GFR, Estimated: >60 mL/min (ref >60)
Glucose, Bld: 87 mg/dL (ref 70–99)
Potassium: 3.7 mmol/L (ref 3.5–5.1)
Sodium: 139 mmol/L (ref 135–145)

## 2024-03-28 LAB — HM COLONOSCOPY

## 2024-03-28 SURGERY — COLONOSCOPY
Anesthesia: Monitor Anesthesia Care

## 2024-03-28 MED ORDER — PROPOFOL 10 MG/ML IV BOLUS
INTRAVENOUS | Status: DC | PRN
Start: 2024-03-28 — End: 2024-03-28
  Administered 2024-03-28: 100 mg via INTRAVENOUS
  Administered 2024-03-28: 10 mg via INTRAVENOUS
  Administered 2024-03-28: 25 mg via INTRAVENOUS
  Administered 2024-03-28: 90 mg via INTRAVENOUS

## 2024-03-28 MED ORDER — PROPOFOL 10 MG/ML IV BOLUS
INTRAVENOUS | Status: AC
  Filled 2024-03-28: qty 20

## 2024-03-28 MED ORDER — LACTATED RINGERS IV SOLN: INTRAVENOUS | Status: DC

## 2024-03-28 MED ORDER — PROPOFOL 500 MG/50ML IV EMUL
INTRAVENOUS | Status: AC
  Filled 2024-03-28: qty 50

## 2024-03-28 NOTE — Discharge Instructions (Signed)
 You are being discharged to home.  Resume your previous diet.  Your physician has recommended a repeat colonoscopy (date to be determined after pending pathology results are reviewed) for screening purposes.  We are waiting for your pathology results.  If recurrent rectal bleeding, may use Preparation H suppositories for 1 week.

## 2024-03-28 NOTE — Transfer of Care (Signed)
 Immediate Anesthesia Transfer of Care Note  Patient: Vanessa Mcconnell  Procedure(s) Performed: COLONOSCOPY  Patient Location: PACU  Anesthesia Type:MAC  Level of Consciousness: awake, oriented, drowsy, and patient cooperative  Airway & Oxygen Therapy: Patient Spontanous Breathing  Post-op Assessment: Report given to RN, Post -op Vital signs reviewed and stable, and Patient moving all extremities  Post vital signs: Reviewed and stable  Last Vitals:  Vitals Value Taken Time  BP 96/62 03/28/24 13:37  Temp 36.7 C 03/28/24 13:37  Pulse 76 03/28/24 13:37  Resp 12 03/28/24 13:37  SpO2 99 % 03/28/24 13:37    Last Pain:  Vitals:   03/28/24 1337  TempSrc: Oral  PainSc: 0-No pain         Complications: No notable events documented.

## 2024-03-28 NOTE — Interval H&P Note (Signed)
 History and Physical Interval Note:  03/28/2024 12:34 PM  Vanessa Mcconnell  has presented today for surgery, with the diagnosis of rectal bleeding, diarrhea.  The various methods of treatment have been discussed with the patient and family. After consideration of risks, benefits and other options for treatment, the patient has consented to  Procedure(s) with comments: COLONOSCOPY (N/A) - 2:00pm, ASA 1-2 as a surgical intervention.  The patient's history has been reviewed, patient examined, no change in status, stable for surgery.  I have reviewed the patient's chart and labs.  Questions were answered to the patient's satisfaction.     Keanen Dohse Castaneda Mayorga

## 2024-03-28 NOTE — Op Note (Signed)
 [facility] Patient Name: Vanessa Mcconnell Procedure Date: 03/28/2024 12:23 PM MRN: [id number] Date of Birth: 1960-08-29 Attending MD: Toribio Fortune , , [id number] CSN: [id number] Age: 63 Admit Type: Outpatient Procedure:                Colonoscopy Indications:              Clinically significant diarrhea of unexplained                            origin, Rectal bleeding Providers:                Toribio Fortune, Harlene Lips, Daphne Mulch                            Technician, Technician Referring MD:              Medicines:                Monitored Anesthesia Care Complications:            No immediate complications. Estimated Blood Loss:     Estimated blood loss: none. Procedure:                Pre-Anesthesia Assessment:                           - Prior to the procedure, a History and Physical                            was performed, and patient medications, allergies                            and sensitivities were reviewed. The patient's                            tolerance of previous anesthesia was reviewed.                           - The risks and benefits of the procedure and the                            sedation options and risks were discussed with the                            patient. All questions were answered and informed                            consent was obtained.                           - ASA Grade Assessment: II - A patient with mild                            systemic disease.                           After obtaining informed consent, the colonoscope  was passed under direct vision. Throughout the                            procedure, the patient's blood pressure, pulse, and                            oxygen saturations were monitored continuously. The                            [id number]-[id number] ([id number]) Peds Colon was introduced                            through the anus and advanced to the the terminal                             ileum. The colonoscopy was performed without                            difficulty. The patient tolerated the procedure                            well. The quality of the bowel preparation was good. Scope In: 1:15:31 PM Scope Out: 1:29:39 PM Scope Withdrawal Time: 0 hours 11 minutes 17 seconds  Total Procedure Duration: 0 hours 14 minutes 8 seconds  Findings:      The perianal and digital rectal examinations were normal.      The terminal ileum appeared normal.      Scattered small-mouthed diverticula were found in the sigmoid colon,       transverse colon and ascending colon.      A 3 mm polyp was found in the transverse colon. The polyp was sessile.       The polyp was removed with a cold snare. Resection and retrieval were       complete.      Non-bleeding internal hemorrhoids were found during retroflexion. The       hemorrhoids were small. Impression:               - The examined portion of the ileum was normal.                           - Diverticulosis in the sigmoid colon, in the                            transverse colon and in the ascending colon.                           - One 3 mm polyp in the transverse colon, removed                            with a cold snare. Resected and retrieved.                           - Non-bleeding internal hemorrhoids. Moderate Sedation:      Per Anesthesia Care Recommendation:           -  Discharge patient to home.                           - Resume previous diet.                           - Repeat colonoscopy date to be determined after                            pending pathology results are reviewed for                            screening purposes.                           - If recurrent rectal bleeding, may use Preparation                            H suppositories for 1 week                           - Await pathology results. Procedure Code(s):        --- Professional ---                           [id number],  Colonoscopy, flexible; with removal of                            tumor(s), polyp(s), or other lesion(s) by snare                            technique Diagnosis Code(s):        --- Professional ---                           K64.8, Other hemorrhoids                           D12.3, Benign neoplasm of transverse colon (hepatic                            flexure or splenic flexure)                           R19.7, Diarrhea, unspecified                           K62.5, Hemorrhage of anus and rectum                           K57.30, Diverticulosis of large intestine without                            perforation or abscess without bleeding CPT copyright 2022 American Medical Association. All rights reserved. The codes documented in this report are preliminary and upon coder review may  be revised to meet current compliance requirements. Toribio Fortune, MD Toribio Fortune,  03/28/2024  1:35:01 PM This report has been signed electronically. Number of Addenda: 0

## 2024-03-28 NOTE — Anesthesia Preprocedure Evaluation (Addendum)
 Anesthesia Evaluation  Patient identified by MRN, date of birth, ID band Patient awake    Reviewed: Allergy & Precautions, H&P , NPO status , Patient's Chart, lab work & pertinent test results, reviewed documented beta blocker date and time   History of Anesthesia Complications (+) MALIGNANT HYPERTHERMIA and history of anesthetic complications  Airway Mallampati: II  TM Distance: >3 FB Neck ROM: full    Dental no notable dental hx. (+) Edentulous Upper   Pulmonary neg pulmonary ROS, asthma , sleep apnea , pneumonia, COPD   Pulmonary exam normal breath sounds clear to auscultation       Cardiovascular Exercise Tolerance: Good hypertension, + DOE   Rhythm:regular Rate:Normal     Neuro/Psych Seizures -,   Neuromuscular disease negative neurological ROS  negative psych ROS   GI/Hepatic negative GI ROS, Neg liver ROS,GERD  ,,  Endo/Other  negative endocrine ROS    Renal/GU negative Renal ROS  negative genitourinary   Musculoskeletal   Abdominal   Peds  Hematology negative hematology ROS (+)   Anesthesia Other Findings History of MH (+personal, +family)  Reproductive/Obstetrics negative OB ROS                              Anesthesia Physical Anesthesia Plan  ASA: 3  Anesthesia Plan: General   Post-op Pain Management:    Induction:   PONV Risk Score and Plan: Propofol  infusion and TIVA  Airway Management Planned:   Additional Equipment:   Intra-op Plan:   Post-operative Plan:   Informed Consent: I have reviewed the patients History and Physical, chart, labs and discussed the procedure including the risks, benefits and alternatives for the proposed anesthesia with the patient or authorized representative who has indicated his/her understanding and acceptance.     Dental Advisory Given  Plan Discussed with: CRNA  Anesthesia Plan Comments: (Plan TIVA, non-triggering  anesthetic Filter changed and circuit flushed.)         Anesthesia Quick Evaluation

## 2024-03-29 ENCOUNTER — Encounter (INDEPENDENT_AMBULATORY_CARE_PROVIDER_SITE_OTHER): Payer: Self-pay | Admitting: *Deleted

## 2024-03-29 ENCOUNTER — Ambulatory Visit (INDEPENDENT_AMBULATORY_CARE_PROVIDER_SITE_OTHER): Payer: Self-pay | Admitting: Gastroenterology

## 2024-03-29 LAB — SURGICAL PATHOLOGY

## 2024-03-29 NOTE — Anesthesia Postprocedure Evaluation (Signed)
 Anesthesia Post Note  Patient: Vanessa Mcconnell  Procedure(s) Performed: COLONOSCOPY  Patient location during evaluation: Phase II Anesthesia Type: MAC Level of consciousness: awake Pain management: pain level controlled Vital Signs Assessment: post-procedure vital signs reviewed and stable Respiratory status: spontaneous breathing and respiratory function stable Cardiovascular status: blood pressure returned to baseline and stable Postop Assessment: no headache and no apparent nausea or vomiting Anesthetic complications: no Comments: Late entry   No notable events documented.   Last Vitals:  Vitals:   03/28/24 1337 03/28/24 1346  BP: 96/62 (!) 142/78  Pulse: 76   Resp: 12   Temp: 36.7 C   SpO2: 99%     Last Pain:  Vitals:   03/28/24 1337  TempSrc: Oral  PainSc: 0-No pain                 Yvonna JINNY Bosworth

## 2024-03-29 NOTE — Progress Notes (Signed)
 10 yr TCS noted in recall Patient result letter mailed procedure note and pathology result faxed to PCP

## 2024-03-30 ENCOUNTER — Ambulatory Visit
Admission: RE | Admit: 2024-03-30 | Discharge: 2024-03-30 | Disposition: A | Discharge status: Home / Self Care | Source: Ambulatory Visit | Attending: Internal Medicine | Admitting: Internal Medicine

## 2024-03-30 DIAGNOSIS — Z1231 Encounter for screening mammogram for malignant neoplasm of breast: Secondary | ICD-10-CM | POA: Diagnosis not present

## 2024-03-31 DIAGNOSIS — S3993XA Unspecified injury of pelvis, initial encounter: Secondary | ICD-10-CM | POA: Diagnosis not present

## 2024-03-31 DIAGNOSIS — S199XXA Unspecified injury of neck, initial encounter: Secondary | ICD-10-CM | POA: Diagnosis not present

## 2024-03-31 DIAGNOSIS — M79645 Pain in left finger(s): Secondary | ICD-10-CM | POA: Diagnosis not present

## 2024-03-31 DIAGNOSIS — M7751 Other enthesopathy of right foot: Secondary | ICD-10-CM | POA: Diagnosis not present

## 2024-03-31 DIAGNOSIS — S80811A Abrasion, right lower leg, initial encounter: Secondary | ICD-10-CM | POA: Diagnosis not present

## 2024-03-31 DIAGNOSIS — S60512A Abrasion of left hand, initial encounter: Secondary | ICD-10-CM | POA: Diagnosis not present

## 2024-03-31 DIAGNOSIS — M545 Low back pain, unspecified: Secondary | ICD-10-CM | POA: Diagnosis not present

## 2024-03-31 DIAGNOSIS — S8991XA Unspecified injury of right lower leg, initial encounter: Secondary | ICD-10-CM | POA: Diagnosis not present

## 2024-03-31 DIAGNOSIS — M25561 Pain in right knee: Secondary | ICD-10-CM | POA: Diagnosis not present

## 2024-03-31 DIAGNOSIS — S0990XA Unspecified injury of head, initial encounter: Secondary | ICD-10-CM | POA: Diagnosis not present

## 2024-03-31 DIAGNOSIS — M79671 Pain in right foot: Secondary | ICD-10-CM | POA: Diagnosis not present

## 2024-03-31 DIAGNOSIS — S0001XA Abrasion of scalp, initial encounter: Secondary | ICD-10-CM | POA: Diagnosis not present

## 2024-03-31 DIAGNOSIS — S299XXA Unspecified injury of thorax, initial encounter: Secondary | ICD-10-CM | POA: Diagnosis not present

## 2024-03-31 DIAGNOSIS — S99921A Unspecified injury of right foot, initial encounter: Secondary | ICD-10-CM | POA: Diagnosis not present

## 2024-03-31 DIAGNOSIS — M542 Cervicalgia: Secondary | ICD-10-CM | POA: Diagnosis not present

## 2024-03-31 DIAGNOSIS — S30810A Abrasion of lower back and pelvis, initial encounter: Secondary | ICD-10-CM | POA: Diagnosis not present

## 2024-03-31 DIAGNOSIS — S6992XA Unspecified injury of left wrist, hand and finger(s), initial encounter: Secondary | ICD-10-CM | POA: Diagnosis not present

## 2024-03-31 DIAGNOSIS — S3991XA Unspecified injury of abdomen, initial encounter: Secondary | ICD-10-CM | POA: Diagnosis not present
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
## 2024-04-04 ENCOUNTER — Encounter: Payer: Self-pay | Admitting: Internal Medicine

## 2024-04-04 ENCOUNTER — Ambulatory Visit: Attending: Internal Medicine | Admitting: Internal Medicine

## 2024-04-04 VITALS — BP 132/85 | HR 67 | Ht 62.0 in

## 2024-04-04 DIAGNOSIS — Z299 Encounter for prophylactic measures, unspecified: Secondary | ICD-10-CM | POA: Diagnosis not present

## 2024-04-04 DIAGNOSIS — J449 Chronic obstructive pulmonary disease, unspecified: Secondary | ICD-10-CM | POA: Diagnosis not present

## 2024-04-04 DIAGNOSIS — E782 Mixed hyperlipidemia: Secondary | ICD-10-CM | POA: Diagnosis not present

## 2024-04-04 DIAGNOSIS — R52 Pain, unspecified: Secondary | ICD-10-CM | POA: Diagnosis not present

## 2024-04-04 DIAGNOSIS — I2510 Atherosclerotic heart disease of native coronary artery without angina pectoris: Secondary | ICD-10-CM | POA: Diagnosis not present

## 2024-04-04 DIAGNOSIS — L97911 Non-pressure chronic ulcer of unspecified part of right lower leg limited to breakdown of skin: Secondary | ICD-10-CM | POA: Diagnosis not present

## 2024-04-04 DIAGNOSIS — I10 Essential (primary) hypertension: Secondary | ICD-10-CM

## 2024-04-04 DIAGNOSIS — G35 Multiple sclerosis: Secondary | ICD-10-CM | POA: Diagnosis not present

## 2024-04-04 NOTE — Patient Instructions (Signed)
  Follow-Up: At [facility], you and your health needs are our priority.  As part of our continuing mission to provide you with exceptional heart care, our providers are all part of one team.  This team includes your primary Cardiologist (physician) and Advanced Practice Providers or APPs (Physician Assistants and Nurse Practitioners) who all work together to provide you with the care you need, when you need it.  Your next appointment:   1 year(s)  Provider:   Emeline FORBES Calender, MD

## 2024-04-07 DIAGNOSIS — L97911 Non-pressure chronic ulcer of unspecified part of right lower leg limited to breakdown of skin: Secondary | ICD-10-CM | POA: Diagnosis not present

## 2024-04-07 DIAGNOSIS — I10 Essential (primary) hypertension: Secondary | ICD-10-CM | POA: Diagnosis not present

## 2024-04-07 DIAGNOSIS — K219 Gastro-esophageal reflux disease without esophagitis: Secondary | ICD-10-CM | POA: Diagnosis not present

## 2024-04-26 ENCOUNTER — Encounter (INDEPENDENT_AMBULATORY_CARE_PROVIDER_SITE_OTHER): Payer: Self-pay | Admitting: Gastroenterology

## 2024-04-27 ENCOUNTER — Ambulatory Visit (INDEPENDENT_AMBULATORY_CARE_PROVIDER_SITE_OTHER): Admitting: Internal Medicine

## 2024-04-27 ENCOUNTER — Encounter: Payer: Self-pay | Admitting: Internal Medicine

## 2024-04-27 VITALS — BP 130/83 | HR 66 | Ht 62.0 in | Wt 175.2 lb

## 2024-04-27 DIAGNOSIS — J4489 Other specified chronic obstructive pulmonary disease: Secondary | ICD-10-CM

## 2024-04-27 DIAGNOSIS — R0609 Other forms of dyspnea: Secondary | ICD-10-CM
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
## 2024-04-27 NOTE — Patient Instructions (Signed)
 If doing great then symbicort  160 can 2 pffs 1st thing in am and then 12hours later is optional doing great and not needing any albuterol    Keep up with the exercise  - remember :    Ok to try albuterol  15 min before an activity (on alternating days)  that you know would usually make you short of breath and see if it makes any difference and if makes none then don't take albuterol  after activity unless you can't catch your breath as this means it's the resting that helps, not the albuterol .   If you are satisfied with your treatment plan,  let your doctor know and he/she can either refill your medications or you can return here when your prescription runs out.     If in any way you are not 100% satisfied,  please tell us .  If 100% better, tell your friends!  Pulmonary follow up is as needed

## 2024-04-27 NOTE — Assessment & Plan Note (Addendum)
 Passive exposure to cigarettes growing up/MM - 07/30/2022  After extensive coaching inhaler device,  effectiveness =  75% (short ti) -Labs ordered 07/30/2022  :      alpha one AT phenotype MM/ Eos 0.2 / IgE   5 - 07/30/2022   Walked on RA  x  3  lap(s) =  approx 450  ft  @ mod fast pace, stopped due to end of study with lowest 02 sats 94% and mild chest tightness on 2nd/3rd lap   - PFTs  07/31/22 slt blunted exp loop in effort dep portion/ o/w completely wnl  - 12/14/2022  After extensive coaching inhaler device,  effectiveness =    50% (short Ti)  - 06/15/2023  After extensive coaching inhaler device,  effectiveness =    60% > retry starting symbicort  80 2bid as still has very sensitive upper airway coughing on insp with saba demonstration and set off by perfumes by hx -  10/13/2023 try symb 160 2bid  - 04/27/2024  After extensive coaching inhaler device,  effectiveness =    80% hfa (short ti)   All goals of chronic asthma control met including optimal function and elimination of symptoms with minimal need for rescue therapy.  Contingencies discussed in full including contacting this office immediately if not controlling the symptoms using the rule of two's.     Ok to leave off symbicort  160 in pm if doing great and return here prn if Dr Rosamond willing to refill symbicort  160

## 2024-04-28 ENCOUNTER — Other Ambulatory Visit (INDEPENDENT_AMBULATORY_CARE_PROVIDER_SITE_OTHER): Payer: Self-pay | Admitting: Gastroenterology

## 2024-04-28 DIAGNOSIS — R1030 Lower abdominal pain, unspecified: Secondary | ICD-10-CM

## 2024-04-28 DIAGNOSIS — K589 Irritable bowel syndrome without diarrhea: Secondary | ICD-10-CM

## 2024-05-03 ENCOUNTER — Ambulatory Visit: Admitting: Family Medicine

## 2024-05-15 ENCOUNTER — Encounter: Payer: Self-pay | Admitting: Radiology

## 2024-05-24 ENCOUNTER — Encounter (INDEPENDENT_AMBULATORY_CARE_PROVIDER_SITE_OTHER): Payer: Self-pay | Admitting: Gastroenterology

## 2024-06-19 ENCOUNTER — Ambulatory Visit (INDEPENDENT_AMBULATORY_CARE_PROVIDER_SITE_OTHER): Admitting: Gastroenterology

## 2024-06-19 ENCOUNTER — Encounter (INDEPENDENT_AMBULATORY_CARE_PROVIDER_SITE_OTHER): Payer: Self-pay | Admitting: Gastroenterology

## 2024-06-19 VITALS — BP 122/80 | HR 76 | Temp 97.8°F | Ht 62.0 in | Wt 179.6 lb

## 2024-06-19 DIAGNOSIS — K649 Unspecified hemorrhoids: Secondary | ICD-10-CM | POA: Insufficient documentation

## 2024-06-19 DIAGNOSIS — R197 Diarrhea, unspecified: Secondary | ICD-10-CM | POA: Diagnosis not present

## 2024-06-19 DIAGNOSIS — K625 Hemorrhage of anus and rectum: Secondary | ICD-10-CM

## 2024-06-19 NOTE — Progress Notes (Signed)
 Toribio Fortune, M.D. Gastroenterology & Hepatology [facility] Gastroenterology [address] [place], [place] [address]  Primary Care Physician: Rosamond Leta NOVAK, MD [address] [place] [address]  I will communicate my assessment and recommendations to the referring MD via EMR.  Problems: Rectal bleeding due to hemorrhoids Antibiotic induced diarrhea  History of Present Illness: Vanessa Mcconnell is a 63 y.o. female with past medical history of asthma, COPD, for myalgia, hypertension, hyperlipidemia, multiple sclerosis, osteoporosis, pancreatitis, small intestinal bacterial overgrowth, who presents for follow-up of rectal bleeding.  The patient was last seen on 03/22/2024. At that time, the patient was scheduled for colonoscopy with finding described below and was prescribed Bentyl  as needed for abdominal pain.  C. difficile was checked which came back negative.  Given presence of hemorrhoids, the patient was given Preparation H for 1 week in case she bled again.  Patient reports she recently had the flu and strep throat. She was given a Z-pack a week ago. She reports that she was doing well until the weekend when she had diarrhea - twice per day. She believes her diarrhea was related to the antibiotics or the recent infection as she was having regular Bms before.  No more rectal bleeding since last visit. The patient denies having any nausea, vomiting, fever, chills, hematochezia, melena, hematemesis, abdominal distention, abdominal pain, diarrhea, jaundice, pruritus. Has gained a few lb recently.  Previous workup: CT abdomen and pelvis without IV contrast on  01/19/24 which showed diverticulosis but otherwise was normal.  Last EGD: 02/11/2015 Esophagitis with peptic stricture that was patent, moderate size hiatal hernia, nonerosive gastritis.  Biopsies of the stomach showed chronic gastritis negative for H. pylori, normal duodenum. Last Colonoscopy: 03/28/2024 -  The examined portion of the ileum was normal. - Diverticulosis in the sigmoid colon, in the transverse colon and in the ascending colon. - One 3 mm polyp in the transverse colon, removed with a cold snare. Resected and retrieved. - Non- bleeding internal hemorrhoids. Pathology showed normal random biopsies.  Polyp was hyperplastic.  Recommended repeat colonoscopy in 10 years.  Past Medical History: Past Medical History:  Diagnosis Date   Arthritis    Asthma    COPD (chronic obstructive pulmonary disease) (HCC)    Essential hypertension    Fibromyalgia    History of migraine    Hyperlipidemia    Malignant hyperthermia    Reportedly at age 26 with tonsillectomy   MS (multiple sclerosis)    MVP (mitral valve prolapse)    Osteoporosis    Pancreatitis    Reportedly single episode in childhood   Pneumonia    Small intestinal bacterial overgrowth 12/2015   Positive breath test   Thoracic disc herniation     Past Surgical History: Past Surgical History:  Procedure Laterality Date   ABDOMINAL HYSTERECTOMY     BREAST BIOPSY Left 1990's   3 areas biopsied at different times, all with needle not excisional   CATARACT EXTRACTION W/PHACO Left 09/17/2023   Procedure: PHACOEMULSIFICATION, CATARACT, WITH IOL INSERTION;  Surgeon: Harrie Agent, MD;  Location: [facility];  Service: Ophthalmology;  Laterality: Left;  CDE: 6.03   CATARACT EXTRACTION W/PHACO Right 10/15/2023   Procedure: PHACOEMULSIFICATION, CATARACT, WITH IOL INSERTION;  Surgeon: Harrie Agent, MD;  Location: [facility];  Service: Ophthalmology;  Laterality: Right;  CDE: 9.02   COLONOSCOPY N/A 02/11/2015   DOQ:fnizmjuz diverticulosis in the sigmoid colon/moderate internal hemorrhoids   COLONOSCOPY N/A 03/28/2024   Procedure: COLONOSCOPY;  Surgeon:  Eartha Angelia Sieving, MD;  Location: [facility];  Service: Gastroenterology;  Laterality: N/A;  2:00pm, ASA 1-2   DEBRIDEMENT LEG     cellulitis debridement as a teen   ESOPHAGEAL DILATION   02/11/2015   Procedure: ESOPHAGEAL DILATION;  Surgeon: Margo LITTIE Haddock, MD;  Location: [facility];  Service: Endoscopy;;   ESOPHAGOGASTRODUODENOSCOPY N/A 02/11/2015   DOQ:fnizmjuz size HH/mild non-erosive gastritis   strep infection     causing skin infection, debridement buttocks, hip, legs   TONSILLECTOMY     ULNER NERVE SURGERY     Right Elbow    Family History: Family History  Problem Relation Age of Onset   Hypertension Mother    Diabetes Father    Heart attack Father        CABG X'3 & stent placement   Hypertension Father    Epilepsy Sister    Supraventricular tachycardia Sister    Other Sister        hole in heart   Heart attack Maternal Grandfather    Peripheral Artery Disease Sister    CAD Sister    Congestive Heart Failure Sister        mild diastolic heart failure   Heart disease Son    Heart disease Son    Colon cancer Neg Hx    Breast cancer Neg Hx     Social History: Social History   Tobacco Use  Smoking Status Never  Smokeless Tobacco Never  Tobacco Comments   Never smoked   Social History   Substance and Sexual Activity  Alcohol  Use No   Alcohol /week: 0.0 standard drinks of alcohol    Social History   Substance and Sexual Activity  Drug Use No    Allergies: Allergies  Allergen Reactions   Ivp Dye [Iodinated Contrast Media] Anaphylaxis and Rash   Benadryl  [Diphenhydramine  Hcl] Hives and Other (See Comments)    Passed out   Erythromycin Hives   Iohexol      Code: HIVES, Desc: Anaphylaxis/Hives, Onset Date: [id number]    Anesthesia S-I-40 [Propofol ]     Pt denies, test given without reaction.    Latex Rash   Penicillins Swelling and Rash    Has patient had a PCN reaction causing immediate rash, facial/tongue/throat swelling, SOB or lightheadedness with hypotension: Yes Has patient had a PCN reaction causing severe rash involving mucus membranes or skin necrosis: No Has patient had a PCN reaction that required hospitalization: No Has  patient had a PCN reaction occurring within the last 10 years: No If all of the above answers are NO, then may proceed with Cephalosporin use.   Tetracyclines & Related Rash    Medications: Current Outpatient Medications  Medication Sig Dispense Refill   albuterol  (PROVENTIL  HFA;VENTOLIN  HFA) 108 (90 Base) MCG/ACT inhaler Inhale into the lungs every 6 (six) hours as needed for wheezing or shortness of breath.     alendronate  (FOSAMAX ) 70 MG tablet Take 70 mg by mouth once a week.     amLODipine  (NORVASC ) 5 MG tablet Take 1 tablet (5 mg total) by mouth daily. 90 tablet 3   budesonide -formoterol  (SYMBICORT ) 160-4.5 MCG/ACT inhaler Take 2 puffs first thing in am and then another 2 puffs about 12 hours later. 10.2 g 11   Cholecalciferol (VITAMIN D3) 125 MCG (5000 UT) TABS Take by mouth. Per patient taking with Calcium (Patient taking differently: Take by mouth daily at 6 (six) AM. Per patient taking with Calcium)     dicyclomine  (BENTYL ) 10 MG capsule  TAKE 1 CAPSULE (10 MG TOTAL) BY MOUTH EVERY 12 (TWELVE) HOURS AS NEEDED (ABDOMINAL PAIN). 180 capsule 1   DULoxetine  (CYMBALTA ) 60 MG capsule Take 1 capsule (60 mg total) by mouth at bedtime. 90 capsule 3   famotidine  (PEPCID ) 20 MG tablet One after supper 90 tablet 11   fluticasone (FLONASE) 50 MCG/ACT nasal spray as needed.     gabapentin  (NEURONTIN ) 300 MG capsule Take 1 capsule (300 mg total) by mouth 3 (three) times daily AND 2 capsules (600 mg total) at bedtime. One po qAM, one po qNoon, one po qEvening and two po qhs. 450 capsule 3   hydrochlorothiazide  (MICROZIDE ) 12.5 MG capsule Take 12.5 mg by mouth daily. (Patient taking differently: Take 12.5 mg by mouth as needed.)     levETIRAcetam  (KEPPRA  XR) 750 MG 24 hr tablet Take 3 tablets by mouth every day 90 tablet 10   meclizine (ANTIVERT) 25 MG tablet Take 25 mg by mouth 3 (three) times daily as needed.     metoprolol  succinate (TOPROL -XL) 25 MG 24 hr tablet Take 25 mg by mouth 2 (two) times  daily.  5   Midazolam , Anticonvulsant, (NAYZILAM  NA) Place into the nose. (Patient taking differently: Place into the nose as needed.)     ondansetron  (ZOFRAN ) 4 MG tablet Take 4 mg by mouth as needed.     pantoprazole  (PROTONIX ) 40 MG tablet Take 1 tablet (40 mg total) by mouth daily. 30 minutes before breakfast 90 tablet 3   potassium chloride  (MICRO-K ) 10 MEQ CR capsule Take 10 mEq by mouth daily.     prazosin (MINIPRESS) 2 MG capsule Take 2 mg by mouth at bedtime and may repeat dose one time if needed.     rosuvastatin (CRESTOR) 5 MG tablet Take 5 mg by mouth once a week.     tiZANidine  (ZANAFLEX ) 4 MG tablet Take 1 tablet (4 mg total) by mouth 2 (two) times daily as needed for muscle spasms. 60 tablet 11   vitamin B-12 (CYANOCOBALAMIN) 100 MCG tablet Take 100 mcg by mouth daily. Per patient taking a gummie     vitamin C (ASCORBIC ACID) 500 MG tablet Take 500 mg by mouth daily.     zinc gluconate 50 MG tablet Take 50 mg by mouth daily.     No current facility-administered medications for this visit.    Review of Systems: GENERAL: negative for malaise, night sweats HEENT: No changes in hearing or vision, no nose bleeds or other nasal problems. NECK: Negative for lumps, goiter, pain and significant neck swelling RESPIRATORY: Negative for cough, wheezing CARDIOVASCULAR: Negative for chest pain, leg swelling, palpitations, orthopnea GI: SEE HPI MUSCULOSKELETAL: Negative for joint pain or swelling, back pain, and muscle pain. SKIN: Negative for lesions, rash PSYCH: Negative for sleep disturbance, mood disorder and recent psychosocial stressors. HEMATOLOGY Negative for prolonged bleeding, bruising easily, and swollen nodes. ENDOCRINE: Negative for cold or heat intolerance, polyuria, polydipsia and goiter. NEURO: negative for tremor, gait imbalance, syncope and seizures. The remainder of the review of systems is noncontributory.   Physical Exam: BP 122/80 (BP Location: Left Arm, Patient  Position: Sitting, Cuff Size: Large)   Pulse 76   Temp 97.8 F (36.6 C) (Temporal)   Ht 5' 2 (1.575 m)   Wt 179 lb 9.6 oz (81.5 kg)   BMI 32.85 kg/m  GENERAL: The patient is AO x3, in no acute distress. HEENT: Head is normocephalic and atraumatic. EOMI are intact. Mouth is well hydrated and without lesions. NECK: Supple.  No masses LUNGS: Clear to auscultation. No presence of rhonchi/wheezing/rales. Adequate chest expansion HEART: RRR, normal s1 and s2. ABDOMEN: Soft, nontender, no guarding, no peritoneal signs, and nondistended. BS +. No masses. EXTREMITIES: Without any cyanosis, clubbing, rash, lesions or edema. NEUROLOGIC: AOx3, no focal motor deficit. SKIN: no jaundice, no rashes  Imaging/Labs: as above  I personally reviewed and interpreted the available labs, imaging and endoscopic files.  Impression and Plan: Vanessa Mcconnell is a 63 y.o. female with past medical history of asthma, COPD, for myalgia, hypertension, hyperlipidemia, multiple sclerosis, osteoporosis, pancreatitis, small intestinal bacterial overgrowth, who presents for follow-up of rectal bleeding.  The patient had self-limited episodes of rectal bleeding, which were related to hemorrhoids.  We will follow her for now expectantly and if she were to have recurrent symptoms, she can use Preparation H suppositories.  This has resolved and not recurred.  She has also presented significant improvement of her diarrhea episodes, although she recently had recurrent symptoms in the light of recent antibiotic use and upper respiratory infection.  As this has been mild and self-limited, I suspect this is related to her antibiotics, for which she can take daily probiotics for a month.  Patient agreed understood.  -Start taking daily probiotics for a month -Can use Preparation H as needed if presenting recurrent rectal bleeding  All questions were answered.      Toribio Fortune, MD Gastroenterology and Hepatology [facility] Gastroenterology

## 2024-06-19 NOTE — Patient Instructions (Addendum)
 Start taking daily probiotics for a month Can use Preparation H as needed if presenting recurrent rectal bleeding

## 2024-08-13 ENCOUNTER — Other Ambulatory Visit: Payer: Self-pay | Admitting: Internal Medicine

## 2024-08-13 DIAGNOSIS — J4489 Other specified chronic obstructive pulmonary disease: Secondary | ICD-10-CM

## 2025-02-07 ENCOUNTER — Ambulatory Visit: Admitting: Neurology

## 2025-02-07 ENCOUNTER — Ambulatory Visit: Admitting: Family Medicine
